# Patient Record
Sex: Female | Born: 1965 | Race: White | Hispanic: No | Marital: Married | Smoking: Never smoker
Health system: Southern US, Community
[De-identification: ages and names within clinical notes are randomized; demographics above are authoritative.]

## PROBLEM LIST (undated history)

## (undated) DIAGNOSIS — D249 Benign neoplasm of unspecified breast: Secondary | ICD-10-CM

## (undated) DIAGNOSIS — J45909 Unspecified asthma, uncomplicated: Secondary | ICD-10-CM

## (undated) HISTORY — DX: Benign neoplasm of unspecified breast: D24.9

## (undated) HISTORY — DX: Unspecified asthma, uncomplicated: J45.909

---

## 1992-06-25 HISTORY — PX: BREAST LUMPECTOMY: SHX2

## 1999-11-09 ENCOUNTER — Other Ambulatory Visit: Admission: RE | Admit: 1999-11-09 | Discharge: 1999-11-09 | Payer: Self-pay | Admitting: Gynecology

## 1999-12-18 ENCOUNTER — Other Ambulatory Visit: Admission: RE | Admit: 1999-12-18 | Discharge: 1999-12-18 | Payer: Self-pay | Admitting: Gynecology

## 1999-12-18 ENCOUNTER — Encounter (INDEPENDENT_AMBULATORY_CARE_PROVIDER_SITE_OTHER): Payer: Self-pay | Admitting: Specialist

## 2000-05-09 ENCOUNTER — Other Ambulatory Visit: Admission: RE | Admit: 2000-05-09 | Discharge: 2000-05-09 | Payer: Self-pay | Admitting: Gynecology

## 2001-05-04 ENCOUNTER — Emergency Department (HOSPITAL_COMMUNITY): Admission: EM | Admit: 2001-05-04 | Discharge: 2001-05-04 | Payer: Self-pay | Admitting: Emergency Medicine

## 2002-02-04 ENCOUNTER — Other Ambulatory Visit: Admission: RE | Admit: 2002-02-04 | Discharge: 2002-02-04 | Payer: Self-pay | Admitting: Gynecology

## 2002-06-25 HISTORY — PX: SALPINGOOPHORECTOMY: SHX82

## 2002-09-06 ENCOUNTER — Emergency Department (HOSPITAL_COMMUNITY): Admission: EM | Admit: 2002-09-06 | Discharge: 2002-09-06 | Payer: Self-pay | Admitting: Emergency Medicine

## 2003-03-30 ENCOUNTER — Other Ambulatory Visit: Admission: RE | Admit: 2003-03-30 | Discharge: 2003-03-30 | Payer: Self-pay | Admitting: Gynecology

## 2004-03-22 ENCOUNTER — Other Ambulatory Visit: Admission: RE | Admit: 2004-03-22 | Discharge: 2004-03-22 | Payer: Self-pay | Admitting: Obstetrics and Gynecology

## 2005-04-17 ENCOUNTER — Ambulatory Visit: Payer: Self-pay | Admitting: Internal Medicine

## 2005-05-23 ENCOUNTER — Other Ambulatory Visit: Admission: RE | Admit: 2005-05-23 | Discharge: 2005-05-23 | Payer: Self-pay | Admitting: Obstetrics and Gynecology

## 2005-06-15 ENCOUNTER — Ambulatory Visit (HOSPITAL_COMMUNITY): Admission: RE | Admit: 2005-06-15 | Discharge: 2005-06-15 | Payer: Self-pay | Admitting: Obstetrics and Gynecology

## 2006-02-19 ENCOUNTER — Ambulatory Visit: Payer: Self-pay | Admitting: Internal Medicine

## 2006-03-28 ENCOUNTER — Ambulatory Visit: Payer: Self-pay | Admitting: Internal Medicine

## 2006-05-27 ENCOUNTER — Other Ambulatory Visit: Admission: RE | Admit: 2006-05-27 | Discharge: 2006-05-27 | Payer: Self-pay | Admitting: Obstetrics & Gynecology

## 2006-06-26 ENCOUNTER — Ambulatory Visit (HOSPITAL_COMMUNITY): Admission: RE | Admit: 2006-06-26 | Discharge: 2006-06-26 | Payer: Self-pay | Admitting: Obstetrics and Gynecology

## 2006-08-06 ENCOUNTER — Encounter: Admission: RE | Admit: 2006-08-06 | Discharge: 2006-08-06 | Payer: Self-pay | Admitting: Neurological Surgery

## 2007-04-02 DIAGNOSIS — J45909 Unspecified asthma, uncomplicated: Secondary | ICD-10-CM | POA: Insufficient documentation

## 2007-04-02 DIAGNOSIS — M545 Low back pain: Secondary | ICD-10-CM

## 2007-04-02 HISTORY — DX: Unspecified asthma, uncomplicated: J45.909

## 2007-05-01 ENCOUNTER — Ambulatory Visit: Payer: Self-pay | Admitting: Internal Medicine

## 2007-07-09 ENCOUNTER — Encounter: Admission: RE | Admit: 2007-07-09 | Discharge: 2007-07-09 | Payer: Self-pay | Admitting: Obstetrics & Gynecology

## 2007-07-27 DIAGNOSIS — D249 Benign neoplasm of unspecified breast: Secondary | ICD-10-CM

## 2007-07-27 HISTORY — PX: BREAST LUMPECTOMY: SHX2

## 2007-07-27 HISTORY — DX: Benign neoplasm of unspecified breast: D24.9

## 2007-07-27 HISTORY — PX: OTHER SURGICAL HISTORY: SHX169

## 2007-08-13 ENCOUNTER — Telehealth: Payer: Self-pay | Admitting: Internal Medicine

## 2007-08-28 ENCOUNTER — Other Ambulatory Visit: Admission: RE | Admit: 2007-08-28 | Discharge: 2007-08-28 | Payer: Self-pay | Admitting: Obstetrics & Gynecology

## 2007-09-26 ENCOUNTER — Ambulatory Visit: Payer: Self-pay | Admitting: Internal Medicine

## 2008-06-27 ENCOUNTER — Telehealth: Payer: Self-pay | Admitting: Internal Medicine

## 2008-06-28 ENCOUNTER — Ambulatory Visit: Payer: Self-pay | Admitting: Internal Medicine

## 2008-09-02 ENCOUNTER — Other Ambulatory Visit: Admission: RE | Admit: 2008-09-02 | Discharge: 2008-09-02 | Payer: Self-pay | Admitting: Obstetrics & Gynecology

## 2009-03-14 ENCOUNTER — Ambulatory Visit: Payer: Self-pay | Admitting: Internal Medicine

## 2009-05-16 ENCOUNTER — Ambulatory Visit: Payer: Self-pay | Admitting: Internal Medicine

## 2009-05-16 DIAGNOSIS — R21 Rash and other nonspecific skin eruption: Secondary | ICD-10-CM

## 2010-04-05 ENCOUNTER — Ambulatory Visit: Payer: Self-pay | Admitting: Internal Medicine

## 2010-04-05 DIAGNOSIS — J069 Acute upper respiratory infection, unspecified: Secondary | ICD-10-CM | POA: Insufficient documentation

## 2010-07-15 ENCOUNTER — Encounter: Payer: Self-pay | Admitting: Obstetrics and Gynecology

## 2010-07-25 NOTE — Assessment & Plan Note (Signed)
Summary: CONGESTION, TROUBLE BREATHING//SLM   Vital Signs:  Patient profile:   45 year old female Weight:      183 pounds Temp:     98.3 degrees F oral BP sitting:   108 / 74  (right arm) Cuff size:   regular  Vitals Entered By: Duard Brady LPN (April 05, 2010 11:19 AM) CC: c/o cough congestion, ?fever Is Patient Diabetic? No   CC:  c/o cough congestion and ?fever.  History of Present Illness: 45 year old patient who has a history of asthma.  She presents with a 3 to 4-day history of increasing chest congestion, cough, and low-grade fever.  She has been using albuterol with minutes.  It.  She has been off her maintenance Advair for some time.  No active wheezing, shortness of breath or chest pain.  No purulent sputum production.  Allergies (verified): No Known Drug Allergies  Past History:  Past Medical History: Reviewed history from 04/02/2007 and no changes required. SAR Asthma Low back pain  Review of Systems       The patient complains of anorexia, fever, hoarseness, and prolonged cough.  The patient denies weight loss, weight gain, vision loss, decreased hearing, chest pain, syncope, dyspnea on exertion, peripheral edema, headaches, hemoptysis, abdominal pain, melena, hematochezia, severe indigestion/heartburn, hematuria, incontinence, genital sores, muscle weakness, suspicious skin lesions, transient blindness, difficulty walking, depression, unusual weight change, abnormal bleeding, enlarged lymph nodes, angioedema, breast masses, and testicular masses.    Physical Exam  General:  Well-developed,well-nourished,in no acute distress; alert,appropriate and cooperative throughout examination Head:  Normocephalic and atraumatic without obvious abnormalities. No apparent alopecia or balding. Eyes:  No corneal or conjunctival inflammation noted. EOMI. Perrla. Funduscopic exam benign, without hemorrhages, exudates or papilledema. Vision grossly normal. Ears:  External  ear exam shows no significant lesions or deformities.  Otoscopic examination reveals clear canals, tympanic membranes are intact bilaterally without bulging, retraction, inflammation or discharge. Hearing is grossly normal bilaterally. Mouth:  pharyngeal erythema.   Neck:  No deformities, masses, or tenderness noted. Lungs:  few scattered rhonchi no active wheezing O2 saturation 97% Heart:  Normal rate and regular rhythm. S1 and S2 normal without gallop, murmur, click, rub or other extra sounds.  no tachycardia Abdomen:  Bowel sounds positive,abdomen soft and non-tender without masses, organomegaly or hernias noted.   Impression & Recommendations:  Problem # 1:  URI (ICD-465.9)  The following medications were removed from the medication list:    Hydrocodone-homatropine 5-1.5 Mg/26ml Syrp (Hydrocodone-homatropine) .Marland Kitchen... 1 teaspoon every 6 hours as needed for cough Her updated medication list for this problem includes:    Hydrocodone-homatropine 5-1.5 Mg/49ml Syrp (Hydrocodone-homatropine) .Marland Kitchen... 1 teaspoon every 6 hours as needed for cough  Problem # 2:  ASTHMA (ICD-493.90)  The following medications were removed from the medication list:    Advair Diskus 250-50 Mcg/dose Misc (Fluticasone-salmeterol) .Marland Kitchen... 1 puff two times a day    Prednisone (pak) 5 Mg Tabs (Prednisone) Her updated medication list for this problem includes:    Albuterol 90 Mcg/act Aers (Albuterol) ..... Use as needed    Advair Diskus 250-50 Mcg/dose Aepb (Fluticasone-salmeterol) ..... One puff twice daily  Complete Medication List: 1)  Albuterol 90 Mcg/act Aers (Albuterol) .... Use as needed 2)  Advair Diskus 250-50 Mcg/dose Aepb (Fluticasone-salmeterol) .... One puff twice daily 3)  Hydrocodone-homatropine 5-1.5 Mg/3ml Syrp (Hydrocodone-homatropine) .Marland Kitchen.. 1 teaspoon every 6 hours as needed for cough  Patient Instructions: 1)  Get plenty of rest, drink lots of clear liquids, and use Tylenol or  Ibuprofen for fever and  comfort. Return in 7-10 days if you're not better:sooner if you're feeling worse. Prescriptions: HYDROCODONE-HOMATROPINE 5-1.5 MG/5ML SYRP (HYDROCODONE-HOMATROPINE) 1 teaspoon every 6 hours as needed for cough  #6 oz x 0   Entered and Authorized by:   Gordy Savers  MD   Signed by:   Gordy Savers  MD on 04/05/2010   Method used:   Print then Give to Patient   RxID:   [id number] ADVAIR DISKUS 250-50 MCG/DOSE AEPB (FLUTICASONE-SALMETEROL) one puff twice daily  #3 x 3   Entered and Authorized by:   Gordy Savers  MD   Signed by:   Gordy Savers  MD on 04/05/2010   Method used:   Print then Give to Patient   RxID:   [id number] ALBUTEROL 90 MCG/ACT  AERS (ALBUTEROL) use as needed  #3 x 6   Entered and Authorized by:   Gordy Savers  MD   Signed by:   Gordy Savers  MD on 04/05/2010   Method used:   Print then Give to Patient   RxID:   [id number]

## 2010-11-02 ENCOUNTER — Encounter: Payer: Self-pay | Admitting: Internal Medicine

## 2010-11-02 ENCOUNTER — Ambulatory Visit (INDEPENDENT_AMBULATORY_CARE_PROVIDER_SITE_OTHER): Payer: Self-pay | Admitting: Internal Medicine

## 2010-11-02 VITALS — BP 110/74 | Temp 98.1°F | Wt 170.0 lb

## 2010-11-02 DIAGNOSIS — E785 Hyperlipidemia, unspecified: Secondary | ICD-10-CM

## 2010-11-02 NOTE — Progress Notes (Signed)
  Subjective:    Patient ID: Kathleen Farley, female    DOB: 07/22/1965, 45 y.o.   MRN: [id number]  HPI  45 year old patient who is seen today to discuss an abnormal lipid profile obtained by gynecology. Lipid profile was reviewed LDL cholesterol is elevated at 150 with an HDL cholesterol 43 her total cholesterol HDL ratio was mildly elevated at 5.0. She has no other cardiovascular risk factors such as hypertension smoking diabetes or family history of premature coronary artery disease. She is embarking on a weight loss program and has been very successful she has been better losing weight and exercising regularly. She does not desire to drug therapy for her dyslipidemia.    Review of Systems  Constitutional: Negative.        [Voluntary weight loss HENT: Negative for hearing loss, congestion, sore throat, rhinorrhea, dental problem, sinus pressure and tinnitus.   Eyes: Negative for pain, discharge and visual disturbance.  Respiratory: Negative for cough and shortness of breath.   Cardiovascular: Negative for chest pain, palpitations and leg swelling.  Gastrointestinal: Negative for nausea, vomiting, abdominal pain, diarrhea, constipation, blood in stool and abdominal distention.  Genitourinary: Negative for dysuria, urgency, frequency, hematuria, flank pain, vaginal bleeding, vaginal discharge, difficulty urinating, vaginal pain and pelvic pain.  Musculoskeletal: Negative for joint swelling, arthralgias and gait problem.  Skin: Negative for rash.  Neurological: Negative for dizziness, syncope, speech difficulty, weakness, numbness and headaches.  Hematological: Negative for adenopathy.  Psychiatric/Behavioral: Negative for behavioral problems, dysphoric mood and agitation. The patient is not nervous/anxious.        Objective:   Physical Exam  Constitutional: She appears well-developed and well-nourished. No distress.       Blood pressure and a low normal range at 110/74 Weight 170-down  from 186          Assessment & Plan:   Mild dyslipidemia Exogenous obesity. Patient has a lost weight nicely and continues her efforts at weight loss and exercise. Options were discussed and she does not desire to drug therapy. Her overall risk of premature coronary artery disease is low. Will recheck a lipid panel in 6-12 months

## 2010-11-02 NOTE — Patient Instructions (Signed)
You need to lose weight.  Consider a lower calorie diet and regular exercise.    It is important that you exercise regularly, at least 20 minutes 3 to 4 times per week.  If you develop chest pain or shortness of breath seek  medical attention.  followup lipid profile in 6 months

## 2010-11-14 ENCOUNTER — Encounter: Payer: Self-pay | Admitting: Internal Medicine

## 2011-05-31 ENCOUNTER — Other Ambulatory Visit: Payer: Self-pay | Admitting: Internal Medicine

## 2011-08-15 ENCOUNTER — Encounter: Payer: Self-pay | Admitting: Internal Medicine

## 2011-08-15 ENCOUNTER — Ambulatory Visit (INDEPENDENT_AMBULATORY_CARE_PROVIDER_SITE_OTHER): Payer: 59 | Admitting: Internal Medicine

## 2011-08-15 DIAGNOSIS — J45909 Unspecified asthma, uncomplicated: Secondary | ICD-10-CM

## 2011-08-15 DIAGNOSIS — J069 Acute upper respiratory infection, unspecified: Secondary | ICD-10-CM

## 2011-08-15 MED ORDER — AZITHROMYCIN 250 MG PO TABS: ORAL_TABLET | ORAL | Status: AC

## 2011-08-15 MED ORDER — HYDROCODONE-HOMATROPINE 5-1.5 MG/5ML PO SYRP: 5.0000 mL | ORAL_SOLUTION | Freq: Four times a day (QID) | ORAL | Status: AC | PRN

## 2011-08-15 NOTE — Progress Notes (Signed)
  Subjective:    Patient ID: Kathleen Farley, female    DOB: 07/22/1965, 46 y.o.   MRN: [id number]  HPI  46 year old patient has a history of chronic asthma on maintenance inhalational medications. For the past 2 weeks she has had head and chest congestion. More recently she has developed worsening productive cough yielding yellow sputum she has developed increasing chest tightness and some wheezing. She has required more rescue albuterol treatments. No fever or chills    Review of Systems  Constitutional: Negative.   HENT: Positive for congestion. Negative for hearing loss, sore throat, rhinorrhea, dental problem, sinus pressure and tinnitus.   Eyes: Negative for pain, discharge and visual disturbance.  Respiratory: Positive for cough, chest tightness, shortness of breath and wheezing.   Cardiovascular: Negative for chest pain, palpitations and leg swelling.  Gastrointestinal: Negative for nausea, vomiting, abdominal pain, diarrhea, constipation, blood in stool and abdominal distention.  Genitourinary: Negative for dysuria, urgency, frequency, hematuria, flank pain, vaginal bleeding, vaginal discharge, difficulty urinating, vaginal pain and pelvic pain.  Musculoskeletal: Negative for joint swelling, arthralgias and gait problem.  Skin: Negative for rash.  Neurological: Negative for dizziness, syncope, speech difficulty, weakness, numbness and headaches.  Hematological: Negative for adenopathy.  Psychiatric/Behavioral: Negative for behavioral problems, dysphoric mood and agitation. The patient is not nervous/anxious.        Objective:   Physical Exam  Constitutional: She is oriented to person, place, and time. She appears well-developed and well-nourished.  HENT:  Head: Normocephalic.  Right Ear: External ear normal.  Left Ear: External ear normal.  Mouth/Throat: Oropharynx is clear and moist.  Eyes: Conjunctivae and EOM are normal. Pupils are equal, round, and reactive to light.    Neck: Normal range of motion. Neck supple. No thyromegaly present.  Cardiovascular: Normal rate, regular rhythm, normal heart sounds and intact distal pulses.   Pulmonary/Chest: Effort normal. She has wheezes.       A few faint scattered wheezing  Abdominal: Soft. Bowel sounds are normal. She exhibits no mass. There is no tenderness.  Musculoskeletal: Normal range of motion.  Lymphadenopathy:    She has no cervical adenopathy.  Neurological: She is alert and oriented to person, place, and time.  Skin: Skin is warm and dry. No rash noted.  Psychiatric: She has a normal mood and affect. Her behavior is normal.          Assessment & Plan:    Acute asthmatic bronchitis. We'll continue fluids expectorants and maintenance inhalational medications. Will treat with azithromycin. Will call if unimproved

## 2011-08-15 NOTE — Patient Instructions (Signed)
Get plenty of rest, Drink lots of  clear liquids, and use Tylenol or ibuprofen for fever and discomfort.    Call or return to clinic prn if these symptoms worsen or fail to improve as anticipated.  

## 2011-08-23 ENCOUNTER — Telehealth: Payer: Self-pay | Admitting: Internal Medicine

## 2011-08-23 MED ORDER — PREDNISONE 20 MG PO TABS: ORAL_TABLET | ORAL | Status: DC

## 2011-08-23 NOTE — Telephone Encounter (Signed)
Pt aware rx sent to [facility] 

## 2011-08-23 NOTE — Telephone Encounter (Signed)
Spoke with pt- -still coughing, still whezzing, no fever , bodyaches - feels worse now. Finished abx - using inhaler and cough med Please advise

## 2011-08-23 NOTE — Telephone Encounter (Signed)
Prednisone 20 mg  one twice daily for 5 days then 1 daily for an additional 5 days  #15

## 2011-08-23 NOTE — Telephone Encounter (Signed)
Pt was seen on 08-15-11 for bronchitits. Pt is still cough and still weak. Pt is requesting to talk to kim

## 2011-08-31 ENCOUNTER — Ambulatory Visit (INDEPENDENT_AMBULATORY_CARE_PROVIDER_SITE_OTHER): Payer: 59 | Admitting: Internal Medicine

## 2011-08-31 ENCOUNTER — Encounter: Payer: Self-pay | Admitting: Internal Medicine

## 2011-08-31 DIAGNOSIS — J069 Acute upper respiratory infection, unspecified: Secondary | ICD-10-CM

## 2011-08-31 DIAGNOSIS — J45909 Unspecified asthma, uncomplicated: Secondary | ICD-10-CM

## 2011-08-31 MED ORDER — FLUTICASONE-SALMETEROL 250-50 MCG/DOSE IN AEPB: 1.0000 | INHALATION_SPRAY | Freq: Two times a day (BID) | RESPIRATORY_TRACT | Status: DC

## 2011-08-31 MED ORDER — ALBUTEROL SULFATE HFA 108 (90 BASE) MCG/ACT IN AERS: 2.0000 | INHALATION_SPRAY | Freq: Four times a day (QID) | RESPIRATORY_TRACT | Status: DC | PRN

## 2011-08-31 NOTE — Patient Instructions (Signed)
Get plenty of rest, Drink lots of  clear liquids, and use Tylenol or ibuprofen for fever and discomfort.    Call or return to clinic prn if these symptoms worsen or fail to improve as anticipated.  

## 2011-08-31 NOTE — Progress Notes (Signed)
  Subjective:    Patient ID: Kathleen Farley, female    DOB: 07/22/1965, 46 y.o.   MRN: [id number]  HPI 46 year old patient has a history of chronic asthma. She was seen a few weeks ago and treated for asthmatic bronchitis with prednisone and antibiotic therapy. She seemed to improve. 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 Recently she has had increasing cough sinus and chest congestion. There's been no productive cough or wheezing. She basically feels weak and fatigued.     Review of Systems  Constitutional: Positive for activity change, appetite change and fatigue.  HENT: Positive for congestion and rhinorrhea. Negative for hearing loss, sore throat, dental problem, sinus pressure and tinnitus.   Eyes: Negative for pain, discharge and visual disturbance.  Respiratory: Negative for cough and shortness of breath.   Cardiovascular: Negative for chest pain, palpitations and leg swelling.  Gastrointestinal: Negative for nausea, vomiting, abdominal pain, diarrhea, constipation, blood in stool and abdominal distention.  Genitourinary: Negative for dysuria, urgency, frequency, hematuria, flank pain, vaginal bleeding, vaginal discharge, difficulty urinating, vaginal pain and pelvic pain.  Musculoskeletal: Negative for joint swelling, arthralgias and gait problem.  Skin: Negative for rash.  Neurological: Negative for dizziness, syncope, speech difficulty, weakness, numbness and headaches.  Hematological: Negative for adenopathy.  Psychiatric/Behavioral: Negative for behavioral problems, dysphoric mood and agitation. The patient is not nervous/anxious.        Objective:   Physical Exam  Constitutional: She is oriented to person, place, and time. She appears well-developed and well-nourished.  HENT:  Head: Normocephalic.  Right Ear: External ear normal.  Left Ear: External ear normal.  Mouth/Throat: Oropharynx is clear and moist.  Eyes: Conjunctivae and EOM are  normal. Pupils are equal, round, and reactive to light.  Neck: Normal range of motion. Neck supple. No thyromegaly present.  Cardiovascular: Normal rate, regular rhythm, normal heart sounds and intact distal pulses.   Pulmonary/Chest: Effort normal and breath sounds normal.       O2 saturation 97%. Pulse rate 82  Abdominal: Soft. Bowel sounds are normal. She exhibits no mass. There is no tenderness.  Musculoskeletal: Normal range of motion.  Lymphadenopathy:    She has no cervical adenopathy.  Neurological: She is alert and oriented to person, place, and time.  Skin: Skin is warm and dry. No rash noted.  Psychiatric: She has a normal mood and affect. Her behavior is normal.          Assessment & Plan:  Viral URI. Will continue symptomatic treatment and rest Stable asthma. Continue maintenance medication  Return here when necessary if not improved

## 2011-10-08 LAB — HM MAMMOGRAPHY: HM Mammogram: NEGATIVE

## 2011-10-29 ENCOUNTER — Ambulatory Visit (INDEPENDENT_AMBULATORY_CARE_PROVIDER_SITE_OTHER): Payer: 59 | Admitting: Family

## 2011-10-29 ENCOUNTER — Encounter: Payer: Self-pay | Admitting: Family

## 2011-10-29 VITALS — BP 110/80 | Temp 98.6°F | Wt 178.0 lb

## 2011-10-29 DIAGNOSIS — S139XXA Sprain of joints and ligaments of unspecified parts of neck, initial encounter: Secondary | ICD-10-CM

## 2011-10-29 DIAGNOSIS — S161XXA Strain of muscle, fascia and tendon at neck level, initial encounter: Secondary | ICD-10-CM

## 2011-10-29 DIAGNOSIS — M542 Cervicalgia: Secondary | ICD-10-CM

## 2011-10-29 MED ORDER — CYCLOBENZAPRINE HCL 10 MG PO TABS: 10.0000 mg | ORAL_TABLET | Freq: Three times a day (TID) | ORAL | Status: AC | PRN

## 2011-10-29 MED ORDER — DICLOFENAC SODIUM 75 MG PO TBEC: 75.0000 mg | DELAYED_RELEASE_TABLET | Freq: Two times a day (BID) | ORAL | Status: DC

## 2011-10-29 NOTE — Patient Instructions (Signed)
Muscle Strain A muscle strain, or pulled muscle, occurs when a muscle is over-stretched. A small number of muscle fibers may also be torn. This is especially common in athletes. This happens when a sudden violent force placed on a muscle pushes it past its capacity. Usually, recovery from a pulled muscle takes 1 to 2 weeks. But complete healing will take 5 to 6 weeks. There are millions of muscle fibers. Following injury, your body will usually return to normal quickly. HOME CARE INSTRUCTIONS   While awake, apply ice to the sore muscle for 15 to 20 minutes each hour for the first 2 days. Put ice in a plastic bag and place a towel between the bag of ice and your skin.   Do not use the pulled muscle for several days. Do not use the muscle if you have pain.   You may wrap the injured area with an elastic bandage for comfort. Be careful not to bind it too tightly. This may interfere with blood circulation.   Only take over-the-counter or prescription medicines for pain, discomfort, or fever as directed by your caregiver. Do not use aspirin as this will increase bleeding (bruising) at injury site.   Warming up before exercise helps prevent muscle strains.  SEEK MEDICAL CARE IF:  There is increased pain or swelling in the affected area. MAKE SURE YOU:   Understand these instructions.   Will watch your condition.   Will get help right away if you are not doing well or get worse.  Document Released: 06/11/2005 Document Revised: 05/31/2011 Document Reviewed: 01/08/2007 [employer] Patient Information 2012 [employer]. 

## 2011-10-29 NOTE — Progress Notes (Signed)
  Subjective:    Patient ID: Kathleen Farley, female    DOB: 07/22/1965, 46 y.o.   MRN: [id number]  HPI 46 year old female, nonsmoker, patient of Dr. Kirtland Bouchard. is in today with complaints of neck stiffness and pain x1 week. 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 She had a similar issue to this about 6 months ago. She rates her pain today a 10 out of 10, worse with movement, describes it as achy and tight. She's been taking ibuprofen 200 mg 3 times a day with no relief. Patient reports numbness in her left arm and hand. She denies any lightheadedness, dizziness, chest pain, palpitations, shortness of breath or edema.   Review of Systems  Constitutional: Negative.   Respiratory: Negative.   Cardiovascular: Negative.   Gastrointestinal: Negative.   Musculoskeletal:       Neck pain and left shoulder tenderness  Neurological: Negative.   Hematological: Negative.   Psychiatric/Behavioral: Negative.    Past Medical History  Diagnosis Date  . ASTHMA 04/02/2007    History   Social History  . Marital Status: Single    Spouse Name: N/A    Number of Children: N/A  . Years of Education: N/A   Occupational History  . Not on file.   Social History Main Topics  . Smoking status: Never Smoker   . Smokeless tobacco: Never Used  . Alcohol Use: No  . Drug Use: No  . Sexually Active: Not on file   Other Topics Concern  . Not on file   Social History Narrative  . No narrative on file    No past surgical history on file.  Family History  Problem Relation Age of Onset  . Cancer Neg Hx     family hx breast ca    No Known Allergies  Current Outpatient Prescriptions on File Prior to Visit  Medication Sig Dispense Refill  . albuterol (PROAIR HFA) 108 (90 BASE) MCG/ACT inhaler Inhale 2 puffs into the lungs every 6 (six) hours as needed for wheezing or shortness of breath.  18 g  3  . Fluticasone-Salmeterol (ADVAIR  DISKUS) 250-50 MCG/DOSE AEPB Inhale 1 puff into the lungs every 12 (twelve) hours.  180 each  3    BP 110/80  Temp(Src) 98.6 F (37 C) (Oral)  Wt 178 lb (80.74 kg)chart    Objective:   Physical Exam  Constitutional: She is oriented to person, place, and time. She appears well-developed and well-nourished.  Neck: Neck supple.       Pain elicited with flexion and lateral rotation of the neck.  Cardiovascular: Normal rate, regular rhythm and normal heart sounds.   Pulmonary/Chest: Effort normal and breath sounds normal.  Musculoskeletal: Normal range of motion.       As stated previously  Neurological: She is alert and oriented to person, place, and time.  Skin: Skin is warm and dry.  Psychiatric: She has a normal mood and affect.          Assessment & Plan:  Assessment: Neck pain, muscle strain  Plan: Flexeril 10 mg 3 times a day, warned of drowsiness. Voltaren 75 mg twice a day with food. Ice and heat to the affected area. Patient requests physical therapy, done. Patient call the office if her symptoms worsen or persist, recheck as scheduled and when necessary.

## 2011-10-31 ENCOUNTER — Telehealth: Payer: Self-pay | Admitting: Family Medicine

## 2011-10-31 NOTE — Telephone Encounter (Signed)
[facility] Triage Call Report Triage Record Num: [id number] Operator: Geanie Berlin Patient Name: Kathleen Farley Call Date & Time: 10/28/2011 2:15:38PM Patient Phone: [phone] PCP: Gordy Savers Patient Gender: Female PCP Fax : [phone] Patient DOB: 07-23-1965 [facility] Reason for Call: Caller: Joanell Rising; PCP: Eleonore Chiquito; CB#: [phone]; Call regarding Stiff Neck L side; Onset 10/26/11. Afebrile. No know injury or falls. LMP/Mirena. Used Ibuprofen with minimal relief. Requires assistance from husband to get out of bed d/t severe pain. Denies weakness. Advised to see [facility] now for unbearable neck pain per Neck Pain or Injury Guideline. Protocol(s) Used: Neck Pain or Injury Recommended Outcome per Protocol: See ED Immediately Reason for Outcome: Unbearable pain Care Advice: ~ 05/

## 2011-11-01 ENCOUNTER — Ambulatory Visit: Payer: 59 | Attending: Family | Admitting: Physical Therapy

## 2011-11-01 DIAGNOSIS — IMO0001 Reserved for inherently not codable concepts without codable children: Secondary | ICD-10-CM | POA: Insufficient documentation

## 2011-11-01 DIAGNOSIS — M542 Cervicalgia: Secondary | ICD-10-CM | POA: Insufficient documentation

## 2011-11-01 DIAGNOSIS — M546 Pain in thoracic spine: Secondary | ICD-10-CM | POA: Insufficient documentation

## 2011-11-06 ENCOUNTER — Ambulatory Visit: Payer: 59 | Admitting: Physical Therapy

## 2011-11-08 ENCOUNTER — Ambulatory Visit: Payer: 59 | Admitting: Physical Therapy

## 2011-11-13 ENCOUNTER — Ambulatory Visit: Payer: 59 | Admitting: Physical Therapy

## 2011-11-15 ENCOUNTER — Encounter: Payer: 59 | Admitting: Physical Therapy

## 2011-11-22 ENCOUNTER — Encounter: Payer: 59 | Admitting: Physical Therapy

## 2011-11-27 ENCOUNTER — Encounter: Payer: 59 | Admitting: Physical Therapy

## 2012-01-21 ENCOUNTER — Encounter: Payer: Self-pay | Admitting: Internal Medicine

## 2012-01-21 ENCOUNTER — Ambulatory Visit (INDEPENDENT_AMBULATORY_CARE_PROVIDER_SITE_OTHER): Payer: 59 | Admitting: Internal Medicine

## 2012-01-21 VITALS — BP 94/68 | Temp 98.4°F | Wt 173.0 lb

## 2012-01-21 DIAGNOSIS — J45909 Unspecified asthma, uncomplicated: Secondary | ICD-10-CM

## 2012-01-21 DIAGNOSIS — J069 Acute upper respiratory infection, unspecified: Secondary | ICD-10-CM

## 2012-01-21 MED ORDER — HYDROCODONE-HOMATROPINE 5-1.5 MG/5ML PO SYRP: 5.0000 mL | ORAL_SOLUTION | Freq: Four times a day (QID) | ORAL | Status: AC | PRN

## 2012-01-21 NOTE — Patient Instructions (Signed)
Get plenty of rest, Drink lots of  clear liquids, and use Tylenol or ibuprofen for fever and discomfort.    Call or return to clinic prn if these symptoms worsen or fail to improve as anticipated.  

## 2012-01-21 NOTE — Progress Notes (Signed)
Subjective:    Patient ID: Okey Regal, female    DOB: 07/22/1965, 46 y.o.   MRN: [id number]  HPI  46 year old patient has a history of chronic stable asthma. For the past 2 days she's had increasing cough chest congestion and wheezing. She's had intermittent low-grade fever. Cough is largely nonproductive.  Past Medical History  Diagnosis Date  . ASTHMA 04/02/2007    History   Social History  . Marital Status: Single    Spouse Name: N/A    Number of Children: N/A  . Years of Education: N/A   Occupational History  . Not on file.   Social History Main Topics  . Smoking status: Never Smoker   . Smokeless tobacco: Never Used  . Alcohol Use: No  . Drug Use: No  . Sexually Active: Not on file   Other Topics Concern  . Not on file   Social History Narrative  . No narrative on file    No past surgical history on file.  Family History  Problem Relation Age of Onset  . Cancer Neg Hx     family hx breast ca    No Known Allergies  Current Outpatient Prescriptions on File Prior to Visit  Medication Sig Dispense Refill  . albuterol (PROAIR HFA) 108 (90 BASE) MCG/ACT inhaler Inhale 2 puffs into the lungs every 6 (six) hours as needed for wheezing or shortness of breath.  18 g  3  . diclofenac (VOLTAREN) 75 MG EC tablet Take 1 tablet (75 mg total) by mouth 2 (two) times daily.  30 tablet  0  . Fluticasone-Salmeterol (ADVAIR DISKUS) 250-50 MCG/DOSE AEPB Inhale 1 puff into the lungs every 12 (twelve) hours.  180 each  3    BP 94/68  Temp 98.4 F (36.9 C) (Oral)  Wt 173 lb (78.472 kg)  SpO2 95%       Review of Systems  Constitutional: Positive for fever and fatigue. Negative for chills and diaphoresis.  HENT: Negative for hearing loss, congestion, sore throat, rhinorrhea, dental problem, sinus pressure and tinnitus.   Eyes: Negative for pain, discharge and visual disturbance.  Respiratory: Positive for cough, shortness of breath and wheezing.   Cardiovascular:  Negative for chest pain, palpitations and leg swelling.  Gastrointestinal: Negative for nausea, vomiting, abdominal pain, diarrhea, constipation, blood in stool and abdominal distention.  Genitourinary: Negative for dysuria, urgency, frequency, hematuria, flank pain, vaginal bleeding, vaginal discharge, difficulty urinating, vaginal pain and pelvic pain.  Musculoskeletal: Negative for joint swelling, arthralgias and gait problem.  Skin: Negative for rash.  Neurological: Negative for dizziness, syncope, speech difficulty, weakness, numbness and headaches.  Hematological: Negative for adenopathy.  Psychiatric/Behavioral: Negative for behavioral problems, dysphoric mood and agitation. The patient is not nervous/anxious.        Objective:   Physical Exam  Constitutional: She is oriented to person, place, and time. She appears well-developed and well-nourished.  HENT:  Head: Normocephalic.  Right Ear: External ear normal.  Left Ear: External ear normal.  Mouth/Throat: Oropharynx is clear and moist.  Eyes: Conjunctivae and EOM are normal. Pupils are equal, round, and reactive to light.  Neck: Normal range of motion. Neck supple. No thyromegaly present.  Cardiovascular: Normal rate, regular rhythm, normal heart sounds and intact distal pulses.   Pulmonary/Chest: Effort normal. No respiratory distress. She has wheezes.  Abdominal: Soft. Bowel sounds are normal. She exhibits no mass. There is no tenderness.  Musculoskeletal: Normal range of motion.  Lymphadenopathy:    She has no  cervical adenopathy.  Neurological: She is alert and oriented to person, place, and time.  Skin: Skin is warm and dry. No rash noted.  Psychiatric: She has a normal mood and affect. Her behavior is normal.          Assessment & Plan:   Asthmatic bronchitis secondary to viral URI. Will treat with Depo-Medrol expectorants and antitussives

## 2012-01-22 ENCOUNTER — Telehealth: Payer: Self-pay | Admitting: Internal Medicine

## 2012-01-22 MED ORDER — PREDNISONE 20 MG PO TABS: ORAL_TABLET | ORAL | Status: DC

## 2012-01-22 NOTE — Telephone Encounter (Signed)
Please advise 

## 2012-01-22 NOTE — Telephone Encounter (Signed)
done

## 2012-01-22 NOTE — Telephone Encounter (Signed)
Please call in a prescription for a prednisone Dosepak 5 mg 6 day

## 2012-01-22 NOTE — Telephone Encounter (Signed)
Per Emergent Line: Caller: Sam/Child; PCP: Eleonore Chiquito; CB#: [phone]. Call regarding Patient Has Asthma and Was Given A. Steroid Shot Yesterday, Still Not Helping She Is Using Her Inhaler So Much She Is Shaking. RN spoke with son who is RPh in [place]. Calling to advise Mrs Bouler is still having problems with her asthma. He is not with her but reports she has told him she is using her Inhaler q 1-2 hrs. Caller reports the last time this occured MD gave her oral Prednisone. RN asked for # to call somone who is with her now. Caller reports she does not have Emergent sxs, instead just needs some medication. Son then gave me her # [phone]. Caller reports she is still having sob despite shot yesterday. Wheezing and coughing. She is shaking because of freq use of Albuterol as noted above. Emergent Sxs ruled out. Per Asthma Attack Protocol, following Providers instructions and sxs have not improved, see in 4 hrs. Caller would like MD to be aware of sxs and asking if she can get oral steroids like she did with last flare up. [facility] [address] [facility] [place]. Caller advised a note will be sent to PCP with her request. Caller advised if sxs worsen, she should call 911. She is agreeable.

## 2012-02-20 ENCOUNTER — Ambulatory Visit (INDEPENDENT_AMBULATORY_CARE_PROVIDER_SITE_OTHER): Payer: 59 | Admitting: Internal Medicine

## 2012-02-20 ENCOUNTER — Encounter: Payer: Self-pay | Admitting: Internal Medicine

## 2012-02-20 VITALS — BP 100/70 | Temp 98.0°F | Wt 175.0 lb

## 2012-02-20 DIAGNOSIS — Z8669 Personal history of other diseases of the nervous system and sense organs: Secondary | ICD-10-CM

## 2012-02-20 DIAGNOSIS — J45909 Unspecified asthma, uncomplicated: Secondary | ICD-10-CM

## 2012-02-20 MED ORDER — SUMATRIPTAN SUCCINATE 100 MG PO TABS: 100.0000 mg | ORAL_TABLET | ORAL | Status: DC | PRN

## 2012-02-20 NOTE — Progress Notes (Signed)
Subjective:    Patient ID: Kathleen Farley, female    DOB: 07/22/1965, 46 y.o.   MRN: [id number]  HPI  46 year old patient who is seen today for followup of migraine headaches. She does have a history of migraine headaches that have become bothersome over the past 6 months. She was prescribed Imitrex by OB/GYN which was quite helpful in terminating the headaches. She has one or 2 headaches a month that usually awake her at night and are associated with nausea occasional vomiting and diarrhea. Headaches last several hours and are eventually relieved by ibuprofen or Excedrin. Detumesce sleep she has considerable drowsiness the following day.  Past Medical History  Diagnosis Date  . ASTHMA 04/02/2007    History   Social History  . Marital Status: Single    Spouse Name: N/A    Number of Children: N/A  . Years of Education: N/A   Occupational History  . Not on file.   Social History Main Topics  . Smoking status: Never Smoker   . Smokeless tobacco: Never Used  . Alcohol Use: No  . Drug Use: No  . Sexually Active: Not on file   Other Topics Concern  . Not on file   Social History Narrative  . No narrative on file    No past surgical history on file.  Family History  Problem Relation Age of Onset  . Cancer Neg Hx     family hx breast ca    No Known Allergies  Current Outpatient Prescriptions on File Prior to Visit  Medication Sig Dispense Refill  . albuterol (PROAIR HFA) 108 (90 BASE) MCG/ACT inhaler Inhale 2 puffs into the lungs every 6 (six) hours as needed for wheezing or shortness of breath.  18 g  3  . Fluticasone-Salmeterol (ADVAIR DISKUS) 250-50 MCG/DOSE AEPB Inhale 1 puff into the lungs every 12 (twelve) hours.  180 each  3    BP 100/70  Temp 98 F (36.7 C) (Oral)  Wt 175 lb (79.379 kg)       Review of Systems  Constitutional: Negative.   HENT: Negative for hearing loss, congestion, sore throat, rhinorrhea, dental problem, sinus pressure and tinnitus.     Eyes: Negative for pain, discharge and visual disturbance.  Respiratory: Negative for cough and shortness of breath.   Cardiovascular: Negative for chest pain, palpitations and leg swelling.  Gastrointestinal: Negative for nausea, vomiting, abdominal pain, diarrhea, constipation, blood in stool and abdominal distention.  Genitourinary: Negative for dysuria, urgency, frequency, hematuria, flank pain, vaginal bleeding, vaginal discharge, difficulty urinating, vaginal pain and pelvic pain.  Musculoskeletal: Negative for joint swelling, arthralgias and gait problem.  Skin: Negative for rash.  Neurological: Positive for headaches. Negative for dizziness, syncope, speech difficulty, weakness and numbness.  Hematological: Negative for adenopathy.  Psychiatric/Behavioral: Negative for behavioral problems, dysphoric mood and agitation. The patient is not nervous/anxious.        Objective:   Physical Exam  Constitutional: She is oriented to person, place, and time. She appears well-developed and well-nourished.  HENT:  Head: Normocephalic.  Right Ear: External ear normal.  Left Ear: External ear normal.  Mouth/Throat: Oropharynx is clear and moist.  Eyes: Conjunctivae and EOM are normal. Pupils are equal, round, and reactive to light.  Neck: Normal range of motion. Neck supple. No thyromegaly present.  Cardiovascular: Normal rate, regular rhythm, normal heart sounds and intact distal pulses.   Pulmonary/Chest: Effort normal and breath sounds normal.  Abdominal: She exhibits no mass. There is no tenderness.  Lymphadenopathy:    She has no cervical adenopathy.  Neurological: She is alert and oriented to person, place, and time.  Skin: No rash noted.  Psychiatric: She has a normal mood and affect. Her behavior is normal.          Assessment & Plan:   Migraine headaches. Now occurring at a frequency of one or 2 times per month. Has responded well to Imitrex. Will refill. Will consider  prophylactic medications in the future if symptoms become much more severe. Will also give a trial of Excedrin Migraine

## 2012-02-20 NOTE — Patient Instructions (Signed)
Call or return to clinic prn if these symptoms worsen or fail to improve as anticipated. Migraine Headache A migraine headache is an intense, throbbing pain on one or both sides of your head. The exact cause of a migraine headache is not always known. A migraine may be caused when nerves in the brain become irritated and release chemicals that cause swelling within blood vessels, causing pain. Many migraine sufferers have a family history of migraines. Before you get a migraine you may or may not get an aura. An aura is a group of symptoms that can predict the beginning of a migraine. An aura may include:  Visual changes such as:   Flashing lights.   Bright spots or zig-zag lines.   Tunnel vision.   Feelings of numbness.   Trouble talking.   Muscle weakness.  SYMPTOMS  Pain on one or both sides of your head.   Pain that is pulsating or throbbing in nature.   Pain that is severe enough to prevent daily activities.   Pain that is aggravated by any daily physical activity.   Nausea (feeling sick to your stomach), vomiting, or both.   Pain with exposure to bright lights, loud noises, or activity.   General sensitivity to bright lights or loud noises.  MIGRAINE TRIGGERS Examples of triggers of migraine headaches include:    Alcohol.   Smoking.   Stress.   It may be related to menses (female menstruation).   Aged cheeses.   Foods or drinks that contain nitrates, glutamate, aspartame, or tyramine.   Lack of sleep.   Chocolate.   Caffeine.   Hunger.   Medications such as nitroglycerine (used to treat chest pain), birth control pills, estrogen, and some blood pressure medications.  DIAGNOSIS   A migraine headache is often diagnosed based on:  Symptoms.   Physical examination.   A computerized X-ray scan (computed tomography, CT) of your head.  TREATMENT  Medications can help prevent migraines if they are recurrent or should they become recurrent. Your  caregiver can help you with a medication or treatment program that will be helpful to you.   Lying down in a dark, quiet room may be helpful.   Keeping a headache diary may help you find a trend as to what may be triggering your headaches.  SEEK IMMEDIATE MEDICAL CARE IF:    You have confusion, personality changes or seizures.   You have headaches that wake you from sleep.   You have an increased frequency in your headaches.   You have a stiff neck.   You have a loss of vision.   You have muscle weakness.   You start losing your balance or have trouble walking.   You feel faint or pass out.  MAKE SURE YOU:    Understand these instructions.   Will watch your condition.   Will get help right away if you are not doing well or get worse.  Document Released: 06/11/2005 Document Revised: 05/31/2011 Document Reviewed: 01/25/2009 [facility] Patient Information 2012 [place], [place].

## 2012-03-10 ENCOUNTER — Telehealth: Payer: Self-pay | Admitting: Internal Medicine

## 2012-03-10 ENCOUNTER — Telehealth: Payer: Self-pay | Admitting: Family Medicine

## 2012-03-10 NOTE — Telephone Encounter (Signed)
[facility] Triage Call Report Triage Record Num: [id number] Operator: Chevis Pretty Patient Name: Kathleen Farley Call Date & Time: 03/08/2012 3:14:47PM Patient Phone: [phone] PCP: Gordy Savers Patient Gender: Female PCP Fax : [phone] Patient DOB: [birth year] [facility] Name[facility] Reason for Call: Caller: Joanell Rising; PCP: Eleonore Chiquito [facility]); CB#: [phone]; Call regarding Asthma medication [place] behind while at beach; states uses albuterol bid this time of year but forgot her inhaler. Per asthma protocol, emergent symptoms denied; home care advised, with callback parameters given. Last Rx written for proair inhaler 03/13 with 3 refills; per standing orders, Rx for proair HFA 108, 2 puffs q 4-6 h prn wheezing #1, [facility] [phone]. Protocol(s) Used: Asthma - Adult Recommended Outcome per Protocol: Provide Home/Self Care Reason for Outcome: All other situations Care Advice: An annual influenza vaccination is recommended for all adults 51 years of age or older, those with chronic illness, or in contact with high risk individuals. An immunization for pneumonia is also recommended. The frail elderly may require a second pneumonia immunization 3 to 5 years after the first dose. ~ ~ Follow provider instructions or your specific asthma action plan for your symptoms. ~ Take your asthma action plan with you to all provider visits. ~ If uncertain about provider instructions, speak with provider during regular office hours. Some prescription medications (ACE inhibitors, beta blockers) and over-the-counter medications (NSAIDs) may trigger or worsen asthma symptoms. Talk with your provider if you are taking any of these medications. ~ Daily use or using more than one canister a month (even if not used daily) of short-acting inhaled medication can indicate poor control. Make an appointment with provider to  initiate or increase long-term control. ~ To maintain control, recommended provider visit schedule is: one to two months for severe persistent asthma; three to four months for moderate persistent asthma; six months to 12 months visits for mild persistent asthma and for mild intermittent asthma. ~ Try to identify possible situations or irritants that triggered your symptoms (including medications) and be sure to tell your provider. ~ See provider to re-evaluate treatment plan if you notice need for increased medication use, a decreased activity tolerance, or waking at night with asthma symptoms. ~ ~ HEALTH PROMOTION / MAINTENANCE Take any home-monitoring materials with you to provider visits. Provider may observe your use of inhaler and peak flow meter. Your asthma action plan (AAP) can be reviewed and revised based on your current needs. ~ ~ SYMPTOM / CONDITION MANAGEMENT ~ CAUTIONS PREVENTION OF ASTHMA RECURRENCES: Decrease allergen and irritant exposure - Wash bedding weekly in hot water and dry in a hot dryer. - Cover pillows and mattresses in airtight covers. - Remove carpets, especially in bedrooms. - Do not sleep on fabric-covered furniture. - Clean house thoroughly. - Keep windows of your car and home closed. - Keep humidity level in your home under 50% by using a dehumidifer or air conditioner. ~

## 2012-03-10 NOTE — Telephone Encounter (Signed)
noted 

## 2012-03-10 NOTE — Telephone Encounter (Signed)
[facility] Triage Call Report Triage Record Num: [id number] Operator: Chevis Pretty Patient Name: Kathleen Farley Call Date & Time: 03/08/2012 5:37:10PM Patient Phone: [phone] PCP: Gordy Savers Patient Gender: Female PCP Fax : [phone] Patient DOB: [birth year] [facility] Name[facility] Reason for Call: Caller: Joanell Rising; PCP: Eleonore Chiquito; CB#: [phone]; Call regarding Medication Issue; Medication(s): albuterol; states pharmacy did not receive Rx. Per triage note 03/08/12 1515, Rx for proair HFA, 2 puffs q 6 h prn wheezing, #1, [facility] [phone]. Protocol(s) Used: Office Note Recommended Outcome per Protocol: Information Noted and Sent to Office Reason for Outcome: Caller information to office Care Advice: ~ 09/

## 2012-03-11 ENCOUNTER — Telehealth: Payer: Self-pay | Admitting: Family Medicine

## 2012-03-11 MED ORDER — SUMATRIPTAN SUCCINATE 100 MG PO TABS: 100.0000 mg | ORAL_TABLET | ORAL | Status: DC | PRN

## 2012-03-11 NOTE — Telephone Encounter (Signed)
done

## 2012-03-11 NOTE — Telephone Encounter (Signed)
Insurance denied sumatriptan at 10pills/month. Pt may get 8 pills a month, MAYBE 9.

## 2012-04-28 ENCOUNTER — Other Ambulatory Visit: Payer: Self-pay | Admitting: Internal Medicine

## 2012-05-07 ENCOUNTER — Other Ambulatory Visit: Payer: Self-pay | Admitting: Internal Medicine

## 2012-05-07 MED ORDER — ALBUTEROL SULFATE HFA 108 (90 BASE) MCG/ACT IN AERS: 2.0000 | INHALATION_SPRAY | RESPIRATORY_TRACT | Status: DC | PRN

## 2012-05-07 NOTE — Telephone Encounter (Signed)
[facility]

## 2012-05-07 NOTE — Telephone Encounter (Signed)
Pharm states they never received your refill info on 04/28/12 for the [facility] 108 mgc/act Inhaler. Could you please resend? [id number] [place], [place] - [address]

## 2012-09-16 ENCOUNTER — Emergency Department (HOSPITAL_BASED_OUTPATIENT_CLINIC_OR_DEPARTMENT_OTHER): Payer: 59

## 2012-09-16 ENCOUNTER — Encounter (HOSPITAL_BASED_OUTPATIENT_CLINIC_OR_DEPARTMENT_OTHER): Payer: Self-pay | Admitting: *Deleted

## 2012-09-16 ENCOUNTER — Emergency Department (HOSPITAL_BASED_OUTPATIENT_CLINIC_OR_DEPARTMENT_OTHER)
Admission: EM | Admit: 2012-09-16 | Discharge: 2012-09-16 | Disposition: A | Discharge status: Home / Self Care | Payer: 59 | Attending: Emergency Medicine | Admitting: Emergency Medicine

## 2012-09-16 DIAGNOSIS — Z79899 Other long term (current) drug therapy: Secondary | ICD-10-CM | POA: Insufficient documentation

## 2012-09-16 DIAGNOSIS — IMO0002 Reserved for concepts with insufficient information to code with codable children: Secondary | ICD-10-CM | POA: Insufficient documentation

## 2012-09-16 DIAGNOSIS — R0789 Other chest pain: Secondary | ICD-10-CM

## 2012-09-16 DIAGNOSIS — J45909 Unspecified asthma, uncomplicated: Secondary | ICD-10-CM | POA: Insufficient documentation

## 2012-09-16 DIAGNOSIS — R071 Chest pain on breathing: Secondary | ICD-10-CM | POA: Insufficient documentation

## 2012-09-16 LAB — CBC WITH DIFFERENTIAL/PLATELET
Basophils Absolute: 0 10*3/uL (ref 0.0–0.1)
HCT: 41.9 % (ref 36.0–46.0)
Hemoglobin: 14.4 g/dL (ref 12.0–15.0)
Lymphocytes Relative: 28 % (ref 12–46)
Lymphs Abs: 2.4 10*3/uL (ref 0.7–4.0)
Monocytes Absolute: 0.5 10*3/uL (ref 0.1–1.0)
Monocytes Relative: 5 % (ref 3–12)
Neutro Abs: 5.6 10*3/uL (ref 1.7–7.7)
RBC: 4.97 MIL/uL (ref 3.87–5.11)
RDW: 12.5 % (ref 11.5–15.5)
WBC: 8.5 10*3/uL (ref 4.0–10.5)

## 2012-09-16 LAB — COMPREHENSIVE METABOLIC PANEL
AST: 23 U/L (ref 0–37)
CO2: 26 mEq/L (ref 19–32)
Chloride: 101 mEq/L (ref 96–112)
Creatinine, Ser: 0.9 mg/dL (ref 0.50–1.10)
GFR calc non Af Amer: 74 mL/min — ABNORMAL LOW (ref >90)
Glucose, Bld: 85 mg/dL (ref 70–99)
Total Bilirubin: 0.3 mg/dL (ref 0.3–1.2)

## 2012-09-16 MED ORDER — KETOROLAC TROMETHAMINE 30 MG/ML IJ SOLN
30.0000 mg | Freq: Once | INTRAMUSCULAR | Status: AC
  Administered 2012-09-16: 30 mg via INTRAVENOUS
  Filled 2012-09-16: qty 1

## 2012-09-16 MED ORDER — IBUPROFEN 600 MG PO TABS: 600.0000 mg | ORAL_TABLET | Freq: Four times a day (QID) | ORAL | Status: DC | PRN

## 2012-09-16 MED ORDER — DIAZEPAM 5 MG PO TABS: 2.5000 mg | ORAL_TABLET | ORAL | Status: DC | PRN

## 2012-09-16 MED ORDER — LORAZEPAM 2 MG/ML IJ SOLN
1.0000 mg | Freq: Once | INTRAMUSCULAR | Status: AC
  Administered 2012-09-16: 1 mg via INTRAVENOUS
  Filled 2012-09-16: qty 1

## 2012-09-16 NOTE — ED Notes (Signed)
CP began while getting dressed this am around 0700.  Pt. took one baby ASA PTA.  Never had CP like this before.

## 2012-09-16 NOTE — ED Provider Notes (Signed)
History     CSN: [id number]  Arrival date & time 09/16/12  8295   First MD Initiated Contact with Patient 09/16/12 1834      Chief Complaint  Patient presents with  . Chest Pain    (Consider location/radiation/quality/duration/timing/severity/associated sxs/prior treatment) Patient is a 47 y.o. female presenting with chest pain. The history is provided by the patient.  Chest Pain  patient here with persistent left-sided chest pain under her breast x24 hours. Pain characterized as sharp and worse for certain movements. Denies any anginal type symptoms. No associated diaphoresis or dyspnea. No recent travel history. Denies any leg pain or swelling. No pleuritic component. Pain is localized to the exterior her chest and is without rashes. Symptoms symptoms have been constant and no treatment used prior to arrival. No prior history of same. Denies recent fever cough or chills  Past Medical History  Diagnosis Date  . ASTHMA 04/02/2007    History reviewed. No pertinent past surgical history.  Family History  Problem Relation Age of Onset  . Cancer Neg Hx     family hx breast ca    History  Substance Use Topics  . Smoking status: Never Smoker   . Smokeless tobacco: Never Used  . Alcohol Use: No    OB History   Grav Para Term Preterm Abortions TAB SAB Ect Mult Living                  Review of Systems  Cardiovascular: Positive for chest pain.  All other systems reviewed and are negative.    Allergies  Review of patient's allergies indicates no known allergies.  Home Medications   Current Outpatient Rx  Name  Route  Sig  Dispense  Refill  . albuterol (PROAIR HFA) 108 (90 BASE) MCG/ACT inhaler   Inhalation   Inhale 2 puffs into the lungs every 4 (four) hours as needed for wheezing.   3 each   1   . Fluticasone-Salmeterol (ADVAIR DISKUS) 250-50 MCG/DOSE AEPB   Inhalation   Inhale 1 puff into the lungs every 12 (twelve) hours.   180 each   3   . SUMAtriptan  (IMITREX) 100 MG tablet   Oral   Take 1 tablet (100 mg total) by mouth every 2 (two) hours as needed for migraine.   8 tablet   4     There were no vitals taken for this visit.  Physical Exam  Nursing note and vitals reviewed. Constitutional: She is oriented to person, place, and time. She appears well-developed and well-nourished.  Non-toxic appearance. No distress.  HENT:  Head: Normocephalic and atraumatic.  Eyes: Conjunctivae, EOM and lids are normal. Pupils are equal, round, and reactive to light.  Neck: Normal range of motion. Neck supple. No tracheal deviation present. No mass present.  Cardiovascular: Normal rate, regular rhythm and normal heart sounds.  Exam reveals no gallop.   No murmur heard. Pulmonary/Chest: Effort normal and breath sounds normal. No stridor. No respiratory distress. She has no decreased breath sounds. She has no wheezes. She has no rhonchi. She has no rales.    Abdominal: Soft. Normal appearance and bowel sounds are normal. She exhibits no distension. There is no tenderness. There is no rebound and no CVA tenderness.  Musculoskeletal: Normal range of motion. She exhibits no edema and no tenderness.  Neurological: She is alert and oriented to person, place, and time. She has normal strength. No cranial nerve deficit or sensory deficit. GCS eye subscore is 4. GCS  verbal subscore is 5. GCS motor subscore is 6.  Skin: Skin is warm and dry. No abrasion and no rash noted.  Psychiatric: She has a normal mood and affect. Her speech is normal and behavior is normal.    ED Course  Procedures (including critical care time)  Labs Reviewed  CBC WITH DIFFERENTIAL  COMPREHENSIVE METABOLIC PANEL  TROPONIN I   No results found.   No diagnosis found.    MDM     Rate: 72   Rhythm: normal sinus rhythm  QRS Axis: normal  Intervals: normal  ST/T Wave abnormalities: normal  Conduction Disutrbances:none  Narrative Interpretation:   Old EKG Reviewed: none  available   7:36 PM Patient given medications for suspected musculoskeletal chest pain she feels better. No concern for ACS or PE. Stable for  Toy Baker, MD 09/16/12 [phone]

## 2012-09-16 NOTE — ED Notes (Signed)
Pt to room 1 in w/c, able to stand and pivot to bed in nad. Pt reports mid chest pain onset yesterday while she was in a meeting at work. Pt holding her chest, tender to palpation, pain increases with arm movements and deep breath.

## 2012-09-23 ENCOUNTER — Encounter: Payer: Self-pay | Admitting: Internal Medicine

## 2012-10-23 ENCOUNTER — Telehealth: Payer: Self-pay | Admitting: Internal Medicine

## 2012-10-23 MED ORDER — ALBUTEROL SULFATE HFA 108 (90 BASE) MCG/ACT IN AERS: 2.0000 | INHALATION_SPRAY | RESPIRATORY_TRACT | Status: DC | PRN

## 2012-10-23 NOTE — Telephone Encounter (Signed)
Pt is calling to get a refill of her Albuterol Inhaler.  Pt would like the prescription sent to [facility] because it is cheaper  [phone]) RN tried to triage patient symptoms but pt declined triage stating she is calling for refill not because she is sick but because "she has 1 Albuterol inhaler left".  OFFICE PLEASE FOLLOW UP WITH PT REGARDING ALBUTEROL REFILL.

## 2012-10-23 NOTE — Telephone Encounter (Signed)
Left message on voicemail to call office. Rx Albuterol was sent to [facility] as requested.

## 2012-11-20 ENCOUNTER — Encounter: Payer: Self-pay | Admitting: Obstetrics & Gynecology

## 2012-11-20 ENCOUNTER — Ambulatory Visit: Payer: Self-pay | Admitting: Obstetrics & Gynecology

## 2012-11-20 DIAGNOSIS — Z01419 Encounter for gynecological examination (general) (routine) without abnormal findings: Secondary | ICD-10-CM

## 2012-11-21 ENCOUNTER — Ambulatory Visit (INDEPENDENT_AMBULATORY_CARE_PROVIDER_SITE_OTHER): Payer: 59 | Admitting: Obstetrics & Gynecology

## 2012-11-21 ENCOUNTER — Encounter: Payer: Self-pay | Admitting: Obstetrics & Gynecology

## 2012-11-21 VITALS — BP 110/66 | Ht 65.5 in | Wt 182.0 lb

## 2012-11-21 DIAGNOSIS — R079 Chest pain, unspecified: Secondary | ICD-10-CM

## 2012-11-21 DIAGNOSIS — Z01419 Encounter for gynecological examination (general) (routine) without abnormal findings: Secondary | ICD-10-CM

## 2012-11-21 DIAGNOSIS — R319 Hematuria, unspecified: Secondary | ICD-10-CM

## 2012-11-21 DIAGNOSIS — Z0000 Encounter for general adult medical examination without abnormal findings: Secondary | ICD-10-CM

## 2012-11-21 LAB — POCT URINALYSIS DIPSTICK: Ketones, UA: NEGATIVE

## 2012-11-21 NOTE — Patient Instructions (Addendum)

## 2012-11-21 NOTE — Progress Notes (Signed)
Patient ID: Kathleen Farley, female   DOB: 07/22/1965, 47 y.o.   MRN: [id number]  47 y.o. G5P0030 SingleCaucasianF here for annual exam.  Has been to ER twice due to chest wall pain.  She was evaluated for a heart attack.  Results reviewed from 09/16/12.  Had EKG, labs, CXR.  Diagnosis was muscle spasm.  Almost went to ER again yesterday.  Pain started while sitting in [employer] not doing anything in particular.  Has never seen a cardiologist.  Does have stress--travels a lot with work, family members live in [place], [place], and she is constantly worried with all the conflict there.  Had MMG two days ago.  Was informed it was normal but she has dense breasts.  Questions about this  No vaginal bleeding.    No LMP recorded. Patient is not currently having periods (Reason: IUD).          Sexually active: yes  The current method of family planning is IUD.    Exercising: yes  yoga, running Smoker:  no  Health Maintenance: Pap:  10/24/11 History of abnormal Pap:  no MMG:  11/19/12 Colonoscopy:  never BMD:   never TDaP:  04/2006 Screening Labs: 10/2011, Hb today: pending, Urine today: trace blood, trace protein, small leuks   reports that she has never smoked. She has never used smokeless tobacco. She reports that she does not drink alcohol or use illicit drugs.  Past Medical History  Diagnosis Date  . ASTHMA 04/02/2007  . 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 Breast lumpectomy Right 1994  . Salpingoophorectomy Right 2004    endometriosis    Current Outpatient Prescriptions  Medication Sig Dispense Refill  . albuterol (PROAIR HFA) 108 (90 BASE) MCG/ACT inhaler Inhale 2 puffs into the lungs every 4 (four) hours as needed for wheezing.  3 each  3  . diazepam (VALIUM) 5 MG tablet Take 0.5 tablets (2.5 mg total) by mouth every 4 (four) hours as needed (muscle spasm).  15 tablet  0  . Fluticasone-Salmeterol (ADVAIR DISKUS) 250-50 MCG/DOSE AEPB Inhale 1  puff into the lungs every 12 (twelve) hours.  180 each  3  . ibuprofen (ADVIL,MOTRIN) 600 MG tablet Take 1 tablet (600 mg total) by mouth every 6 (six) hours as needed for pain.  30 tablet  0  . SUMAtriptan (IMITREX) 100 MG tablet Take 1 tablet (100 mg total) by mouth every 2 (two) hours as needed for migraine.  8 tablet  4   No current facility-administered medications for this visit.    Family History  Problem Relation Age of Onset  . Bone cancer Sister 68    deceased  . Breast cancer Sister 22    lives in [place]  . Hypertension Brother   . Hypertension Brother     ROS:  Pertinent items are noted in HPI.  Otherwise, a comprehensive ROS was negative.  Exam:   BP 110/66  Ht 5' 5.5" (1.664 m)  Wt 182 lb (82.555 kg)  BMI 29.82 kg/m2  Weight change: @WEIGHTCHANGE @ Height:   Height: 5' 5.5" (166.4 cm)  Ht Readings from Last 3 Encounters:  11/21/12 5' 5.5" (1.664 m)    General appearance: alert, cooperative and appears stated age Head: Normocephalic, without obvious abnormality, atraumatic Neck: no adenopathy, supple, symmetrical, trachea midline and thyroid normal to inspection and palpation Lungs: clear to auscultation bilaterally Breasts: normal appearance, no masses or tenderness, small well healed scar left  breast Heart: regular rate and rhythm Abdomen: soft, non-tender; bowel sounds normal; no masses,  no organomegaly Extremities: extremities normal, atraumatic, no cyanosis or edema Skin: Skin color, texture, turgor normal. No rashes or lesions Lymph nodes: Cervical, supraclavicular, and axillary nodes normal. No abnormal inguinal nodes palpated Neurologic: Grossly normal   Pelvic: External genitalia:  no lesions              Urethra:  normal appearing urethra with no masses, tenderness or lesions              Bartholins and Skenes: normal                 Vagina: normal appearing vagina with normal color and discharge, no lesions              Cervix: no lesions               Pap taken: no Bimanual Exam:  Uterus:  normal size, contour, position, consistency, mobility, non-tender              Adnexa: normal adnexa and no mass, fullness, tenderness               Rectovaginal: Confirms               Anus:  normal sphincter tone, no lesions  A:  Well Woman with normal exam Mirena IUD use 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   pap smear with neg HR HPV 5/13.  No Pap today TSH and panel today Cardiology referral.   return annually or prn   An After Visit Summary was printed and given to the patient.

## 2012-11-22 LAB — THYROID PANEL WITH TSH
Free Thyroxine Index: 3.5 (ref 1.0–3.9)
T3 Uptake: 32.8 % (ref 22.5–37.0)
TSH: 0.78 u[IU]/mL (ref 0.350–4.500)

## 2012-11-23 LAB — URINE CULTURE

## 2012-11-24 ENCOUNTER — Telehealth: Payer: Self-pay

## 2012-11-24 MED ORDER — CIPROFLOXACIN HCL 500 MG PO TABS: 500.0000 mg | ORAL_TABLET | Freq: Two times a day (BID) | ORAL | Status: DC

## 2012-11-24 NOTE — Telephone Encounter (Signed)
Patient notified of all results. rx sent to pharmacy.

## 2012-11-25 ENCOUNTER — Telehealth: Payer: Self-pay | Admitting: Orthopedic Surgery

## 2012-11-25 NOTE — Telephone Encounter (Signed)
Spoke with pt about appt with Dr. Eden Emms at [facility] 01-22-13 at 11:45. Pt on cancellation list. Address and phone number given. Pt. Agreeable.

## 2013-01-22 ENCOUNTER — Encounter: Payer: Self-pay | Admitting: Cardiovascular Disease

## 2013-01-22 ENCOUNTER — Ambulatory Visit (INDEPENDENT_AMBULATORY_CARE_PROVIDER_SITE_OTHER): Payer: 59 | Admitting: Cardiovascular Disease

## 2013-01-22 VITALS — BP 120/84 | HR 74 | Wt 176.0 lb

## 2013-01-22 DIAGNOSIS — R071 Chest pain on breathing: Secondary | ICD-10-CM

## 2013-01-22 DIAGNOSIS — R079 Chest pain, unspecified: Secondary | ICD-10-CM

## 2013-01-22 DIAGNOSIS — R0789 Other chest pain: Secondary | ICD-10-CM

## 2013-01-22 NOTE — Patient Instructions (Addendum)

## 2013-01-22 NOTE — Progress Notes (Signed)
Patient ID: Kathleen AISPURO, female   DOB: 1965/08/17, 47 y.o.   MRN: [id number] 47 y.o. Female referred for chest pain  Has been to ER twice due to chest wall pain. She was evaluated for a heart attack. Results reviewed from 09/16/12. Had EKG, labs, CXR. Diagnosis was muscle spasm. Almost went to ER again 5/14 . Pain started while sitting in [employer] not doing anything in particular. Has never seen a cardiologist. Does have stress--travels a lot with work, family members live in [place], [place], and she is constantly worried with all the conflict there.  Pain is clearly sharp and likely from muscle Relief with valium    ROS: Denies fever, malais, weight loss, blurry vision, decreased visual acuity, cough, sputum, SOB, hemoptysis, pleuritic pain, palpitaitons, heartburn, abdominal pain, melena, lower extremity edema, claudication, or rash.  All other systems reviewed and negative   General: Affect appropriate Healthy:  appears stated age HEENT: normal Neck supple with no adenopathy JVP normal no bruits no thyromegaly Lungs clear with no wheezing and good diaphragmatic motion Heart:  S1/S2 no murmur,rub, gallop or click PMI normal Abdomen: benighn, BS positve, no tenderness, no AAA no bruit.  No HSM or HJR Distal pulses intact with no bruits No edema Neuro non-focal Skin warm and dry No muscular weakness  Medications Current Outpatient Prescriptions  Medication Sig Dispense Refill  . albuterol (PROAIR HFA) 108 (90 BASE) MCG/ACT inhaler Inhale 2 puffs into the lungs every 4 (four) hours as needed for wheezing.  3 each  3  . diazepam (VALIUM) 5 MG tablet Take 0.5 tablets (2.5 mg total) by mouth every 4 (four) hours as needed (muscle spasm).  15 tablet  0  . Fluticasone-Salmeterol (ADVAIR DISKUS) 250-50 MCG/DOSE AEPB Inhale 1 puff into the lungs every 12 (twelve) hours.  180 each  3  . ibuprofen (ADVIL,MOTRIN) 600 MG tablet Take 1 tablet (600 mg total) by mouth every 6 (six) hours as needed for  pain.  30 tablet  0  . SUMAtriptan (IMITREX) 100 MG tablet Take 1 tablet (100 mg total) by mouth every 2 (two) hours as needed for migraine.  8 tablet  4   No current facility-administered medications for this visit.    Allergies Review of patient's allergies indicates no known allergies.  Family History: Family History  Problem Relation Age of Onset  . Bone cancer Sister 71    deceased  . Breast cancer Sister 73    lives in [place]  . Hypertension Brother   . Hypertension Brother     Social History: History   Social History  . Marital Status: Single    Spouse Name: N/A    Number of Children: 2  . Years of Education: N/A   Occupational History  .  [employer]   Social History Main Topics  . Smoking status: Never Smoker   . Smokeless tobacco: Never Used  . Alcohol Use: No  . Drug Use: No  . Sexually Active: Yes -- Female partner(s)    Birth Control/ Protection: IUD   Other Topics Concern  . Not on file   Social History Narrative  . No narrative on file    Electrocardiogram: 4/2  SR rate 72 normal ECG   Assessment and Plan

## 2013-01-22 NOTE — Assessment & Plan Note (Signed)
Not likely cardiac But has been to ER twice.  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

## 2013-01-23 ENCOUNTER — Telehealth: Payer: Self-pay | Admitting: Cardiology

## 2013-01-23 NOTE — Telephone Encounter (Signed)
New problem   Pt want to know if she has another panic attack before her test what does she need to do.

## 2013-01-23 NOTE — Telephone Encounter (Signed)
INSTRUCTED  PT  TO CALL PMD   TO SEE IF COULD ORDER SOMETHING TO HELP WITH  ANXIETY ./CY

## 2013-01-23 NOTE — Telephone Encounter (Signed)
Dr nishan pt 

## 2013-02-17 ENCOUNTER — Ambulatory Visit (INDEPENDENT_AMBULATORY_CARE_PROVIDER_SITE_OTHER): Payer: 59 | Admitting: Physician Assistant

## 2013-02-17 DIAGNOSIS — R0789 Other chest pain: Secondary | ICD-10-CM

## 2013-02-17 DIAGNOSIS — R071 Chest pain on breathing: Secondary | ICD-10-CM

## 2013-02-17 DIAGNOSIS — R079 Chest pain, unspecified: Secondary | ICD-10-CM

## 2013-02-17 NOTE — Progress Notes (Signed)
Exercise Treadmill Test  Pre-Exercise Testing Evaluation Rhythm: normal sinus  Rate: 74     Test  Exercise Tolerance Test Ordering MD: Charlton Haws, MD  Interpreting MD: Tereso Newcomer, PA-C  Unique Test No: 1  Treadmill:  1  Indication for ETT: chest pain - rule out ischemia  Contraindication to ETT: No   Stress Modality: exercise - treadmill  Cardiac Imaging Performed: non   Protocol: standard Bruce - maximal  Max BP:  171/83  Max MPHR (bpm):  172 85% MPR (bpm):  146  MPHR obtained (bpm):  157 % MPHR obtained:  91  Reached 85% MPHR (min:sec):  9:00 Total Exercise Time (min-sec):  10:00  Workload in METS:  11.7 Borg Scale: 17  Reason ETT Terminated:  patient's desire to stop    ST Segment Analysis At Rest: normal ST segments - no evidence of significant ST depression With Exercise: non-specific ST changes  Other Information Arrhythmia:  No Angina during ETT:  absent (0) Quality of ETT:  diagnostic  ETT Interpretation:  normal - no evidence of ischemia by ST analysis  Comments: Excellent exercise tolerance. No chest pain. Normal BP response to exercise. No significant ST-T changes to suggest ischemia.   Recommendations: Follow up with Dr. Charlton Haws as directed. Signed,  Tereso Newcomer, PA-C   02/17/2013 11:18 AM

## 2013-06-16 ENCOUNTER — Telehealth: Payer: Self-pay | Admitting: Internal Medicine

## 2013-06-16 NOTE — Telephone Encounter (Signed)
Patient Information:  Caller Name: Lennart Pall  Phone: [phone]  Patient: Kathleen Farley, Kathleen Farley  Gender: Female  DOB: 07/22/1965  Age: 47 Years  PCP: Eleonore Chiquito (Family Practice > 59yrs old)  Pregnant: No  Office Follow Up:  Does the office need to follow up with this patient?: No  Instructions For The Office: N/A  RN Note:  will check schedule for appts  Symptoms  Reason For Call & Symptoms: earache right ear with decreased hearing  started 06/15/13 along with cough and tight breathing due to Asthma, using Albuterol inhaler q 8 hrs with relief  Reviewed Health History In EMR: Yes  Reviewed Medications In EMR: Yes  Reviewed Allergies In EMR: Yes  Reviewed Surgeries / Procedures: Yes  Date of Onset of Symptoms: 06/15/2013  Treatments Tried: Ibuprofen 400mg  Po with partial relief  Treatments Tried Worked: No OB / GYN:  LMP: Unknown  Guideline(s) Used:  Earache  Disposition Per Guideline:   See Today in Office  Reason For Disposition Reached:   All other earaches (Exceptions: earache lasting < 1 hour, and earache from air travel)  Advice Given:  N/A  Patient Will Follow Care Advice:  YES

## 2013-07-24 ENCOUNTER — Telehealth: Payer: Self-pay | Admitting: Internal Medicine

## 2013-07-24 ENCOUNTER — Encounter: Payer: Self-pay | Admitting: Family Medicine

## 2013-07-24 ENCOUNTER — Ambulatory Visit (INDEPENDENT_AMBULATORY_CARE_PROVIDER_SITE_OTHER): Payer: 59 | Admitting: Family Medicine

## 2013-07-24 VITALS — BP 126/74 | HR 74 | Temp 98.5°F | Ht 65.5 in | Wt 187.0 lb

## 2013-07-24 DIAGNOSIS — F411 Generalized anxiety disorder: Secondary | ICD-10-CM | POA: Insufficient documentation

## 2013-07-24 MED ORDER — FLUOXETINE HCL 20 MG PO TABS: 20.0000 mg | ORAL_TABLET | Freq: Every day | ORAL | Status: DC

## 2013-07-24 MED ORDER — ALPRAZOLAM 1 MG PO TABS: 1.0000 mg | ORAL_TABLET | Freq: Three times a day (TID) | ORAL | Status: DC | PRN

## 2013-07-24 NOTE — Telephone Encounter (Signed)
Noted  

## 2013-07-24 NOTE — Progress Notes (Signed)
   Subjective:    Patient ID: Kathleen Farley, female    DOB: April 09, 1966, 48 y.o.   MRN: [id number]  HPI Here asking for help with tightening of her throat and tightness of the neck and left shoulder. This started last year and she went to the ER several times. She saw Dr. Johnsie Cancel and had a full cardiac workup which was normal. It was concluded that these were sx of stress, and indeed she is under a lot of this. She has family members in [place], [place] that are in danger daily from the conflicts there, and recently her brother went into a coma. She gets little information about him and she is very worried. She  Can't sleep, she cries all the time. She was given some Valium last year to try, but this does not help. She has spoken to the EAP program at her job, and she will meet with a therapist for the first time next week.    Review of Systems  Constitutional: Negative.   Respiratory: Positive for chest tightness and shortness of breath. Negative for cough.   Cardiovascular: Positive for chest pain. Negative for palpitations and leg swelling.  Psychiatric/Behavioral: Positive for decreased concentration. Negative for hallucinations, confusion and dysphoric mood. The patient is nervous/anxious.        Objective:   Physical Exam  Constitutional: She appears well-developed and well-nourished.  Neck: Neck supple. No thyromegaly present.  Cardiovascular: Normal rate, regular rhythm, normal heart sounds and intact distal pulses.   Pulmonary/Chest: Effort normal and breath sounds normal.  Lymphadenopathy:    She has no cervical adenopathy.  Psychiatric: Her behavior is normal. Thought content normal.  Tearful           Assessment & Plan:  Her sx are truly related to anxiety. Start on Prozac at 20 mg daily and follow up with Dr. Raliegh Ip in 2-3 weeks. Add Xanax to use prn

## 2013-07-24 NOTE — Telephone Encounter (Signed)
Patient Information:  Caller Name: Mikiala  Phone: [phone]  Patient: Kathleen Farley, Kathleen Farley  Gender: Female  DOB: 1965/10/30  Age: 48 Years  PCP: Bluford Kaufmann (Family Practice > 34yrs old)  Pregnant: No  Office Follow Up:  Does the office need to follow up with this patient?: No  Instructions For The Office: N/A  RN Note:  She has Diazepam from previous ED visit for similar symptoms  "for muscle spasms".  Symptoms  Reason For Call & Symptoms: Patient calling about panic attack. She reports pain in shoulders and that she is having a family crisis. Pain rated at 4 of 10, anterior superior shoulder area.  She also reports headache that varies from 1 up to 9.  Eyes are very tired feeling; this is reported as usual symptom.  Shoulder pain worse with moving neck. Emergent symptoms ruled out.  See Today or Tomorrow in Office per Shoulder Pain guideline due to Patient wants to be seen.  Reviewed Health History In EMR: Yes  Reviewed Medications In EMR: Yes  Reviewed Allergies In EMR: Yes  Reviewed Surgeries / Procedures: Yes  Date of Onset of Symptoms: 07/23/2013  Treatments Tried: Ibuprofen - relief of headache  Treatments Tried Worked: No OB / GYN:  LMP: Unknown  Guideline(s) Used:  Shoulder Pain  Disposition Per Guideline:   See Today or Tomorrow in Office  Reason For Disposition Reached:   Patient wants to be seen  Advice Given:  Reassurance - Shoulder Pain  Usually shoulder pain is not serious. You have told me that there is no redness, numbness, or swelling.  Causes of shoulder pain can include a strained muscle, a forgotten minor injury, and tendinitis.  Call Back If  Moderate pain (e.g. interferes with normal activities) lasts over 3 days  Mild pain lasts over 7 days  You become worse.  Patient Will Follow Care Advice:  YES  Appointment Scheduled:  07/24/2013 13:45:00 Appointment Scheduled Provider:  Alysia Penna [facility])

## 2013-07-24 NOTE — Progress Notes (Signed)
Pre visit review using our clinic review tool, if applicable. No additional management support is needed unless otherwise documented below in the visit note. 

## 2013-09-23 ENCOUNTER — Other Ambulatory Visit: Payer: Self-pay | Admitting: Internal Medicine

## 2013-10-23 ENCOUNTER — Encounter: Payer: Self-pay | Admitting: Internal Medicine

## 2013-10-23 ENCOUNTER — Ambulatory Visit (INDEPENDENT_AMBULATORY_CARE_PROVIDER_SITE_OTHER): Payer: 59 | Admitting: Internal Medicine

## 2013-10-23 ENCOUNTER — Telehealth: Payer: Self-pay | Admitting: Internal Medicine

## 2013-10-23 ENCOUNTER — Ambulatory Visit: Payer: Self-pay | Admitting: Family Medicine

## 2013-10-23 VITALS — BP 110/70 | HR 86 | Temp 97.9°F | Wt 187.0 lb

## 2013-10-23 DIAGNOSIS — J45909 Unspecified asthma, uncomplicated: Secondary | ICD-10-CM

## 2013-10-23 MED ORDER — ALBUTEROL SULFATE HFA 108 (90 BASE) MCG/ACT IN AERS: INHALATION_SPRAY | RESPIRATORY_TRACT | Status: DC

## 2013-10-23 MED ORDER — MONTELUKAST SODIUM 10 MG PO TABS: 10.0000 mg | ORAL_TABLET | Freq: Every day | ORAL | Status: DC

## 2013-10-23 MED ORDER — FLUTICASONE-SALMETEROL 250-50 MCG/DOSE IN AEPB: 1.0000 | INHALATION_SPRAY | Freq: Two times a day (BID) | RESPIRATORY_TRACT | Status: DC

## 2013-10-23 MED ORDER — METHYLPREDNISOLONE ACETATE 80 MG/ML IJ SUSP
120.0000 mg | Freq: Once | INTRAMUSCULAR | Status: AC
  Administered 2013-10-23: 120 mg via INTRAMUSCULAR

## 2013-10-23 NOTE — Progress Notes (Signed)
Subjective:    Patient ID: Kathleen Farley, female    DOB: [birth year], 48 y.o.   MRN: [id number]  HPI 48 year old patient who has a history of chronic asthma.  For the past 24 hours.  She has had increasing shortness of breath and wheezing.  She has had increased albuterol use at times hourly.  Symptoms began yesterday late afternoon, while visiting [place]. She states that even prior to this exacerbation over the past 24 hours.  She has had frequent albuterol use often 2 times daily.  She requires albuterol prior to exercise  Past Medical History  Diagnosis Date  . ASTHMA 04/02/2007  . 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 Marital Status: Single    Spouse Name: N/A    Number of Children: 2  . Years of Education: N/A   Occupational History  .  [employer]   Social History Main Topics  . Smoking status: Never Smoker   . Smokeless tobacco: Never Used  . Alcohol Use: No  . Drug Use: No  . Sexual Activity: Yes    Partners: Male    Birth Control/ Protection: IUD   Other Topics Concern  . Not on file   Social History Narrative  . No narrative on file    Past Surgical History  Procedure Laterality Date  . Breast lumpectomy Right 1994  . Salpingoophorectomy Right 2004    endometriosis    Family History  Problem Relation Age of Onset  . Bone cancer Sister 37    deceased  . Breast cancer Sister 56    lives in [place]  . Hypertension Brother   . Hypertension Brother     No Known Allergies  Current Outpatient Prescriptions on File Prior to Visit  Medication Sig Dispense Refill  . ibuprofen (ADVIL,MOTRIN) 600 MG tablet Take 1 tablet (600 mg total) by mouth every 6 (six) hours as needed for pain.  30 tablet  0   No current facility-administered medications on file prior to visit.    BP 110/70  Pulse 86  Temp(Src) 97.9 F (36.6 C) (Oral)  Wt 187 lb (84.823 kg)  SpO2 97%       Review of Systems  Constitutional: Negative.   HENT:  Negative for congestion, dental problem, hearing loss, rhinorrhea, sinus pressure, sore throat and tinnitus.   Eyes: Negative for pain, discharge and visual disturbance.  Respiratory: Positive for shortness of breath and wheezing. Negative for cough.   Cardiovascular: Negative for chest pain, palpitations and leg swelling.  Gastrointestinal: Negative for nausea, vomiting, abdominal pain, diarrhea, constipation, blood in stool and abdominal distention.  Genitourinary: Negative for dysuria, urgency, frequency, hematuria, flank pain, vaginal bleeding, vaginal discharge, difficulty urinating, vaginal pain and pelvic pain.  Musculoskeletal: Negative for arthralgias, gait problem and joint swelling.  Skin: Negative for rash.  Neurological: Negative for dizziness, syncope, speech difficulty, weakness, numbness and headaches.  Hematological: Negative for adenopathy.  Psychiatric/Behavioral: Negative for behavioral problems, dysphoric mood and agitation. The patient is not nervous/anxious.        Objective:   Physical Exam  Constitutional: She is oriented to person, place, and time. She appears well-developed and well-nourished.  HENT:  Head: Normocephalic.  Right Ear: External ear normal.  Left Ear: External ear normal.  Mouth/Throat: Oropharynx is clear and moist.  Eyes: Conjunctivae and EOM are normal. Pupils are equal, round, and reactive to light.  Neck: Normal range of motion. Neck supple. No thyromegaly  present.  Cardiovascular: Normal rate, regular rhythm, normal heart sounds and intact distal pulses.   No  tachycardia  Pulmonary/Chest: Effort normal and breath sounds normal. No respiratory distress. She has no wheezes. She has no rales.  O2 saturation 97  Abdominal: Soft. Bowel sounds are normal. She exhibits no mass. There is no tenderness.  Musculoskeletal: Normal range of motion.  Lymphadenopathy:    She has no cervical adenopathy.  Neurological: She is alert and oriented to  person, place, and time.  Mild tremor  Skin: Skin is warm and dry. No rash noted.  Psychiatric: She has a normal mood and affect. Her behavior is normal.          Assessment & Plan:  Asthma exacerbation Chronic asthma, not well controlled.  Will treat with Depo-Medrol 80.  We'll add Singulair 10 to her maintenance regimen  Recheck 4-6 weeks or as needed

## 2013-10-23 NOTE — Telephone Encounter (Signed)
Discussed with Dr. Raliegh Ip pt to come in now to be seen. Called pt told her to come in now to be seen with Dr. Raliegh Ip. Pt verbalized understanding.

## 2013-10-23 NOTE — Progress Notes (Signed)
Pre visit review using our clinic review tool, if applicable. No additional management support is needed unless otherwise documented below in the visit note. 

## 2013-10-23 NOTE — Telephone Encounter (Signed)
Patient Information:  Caller Name: Tawsha  Phone: [phone]  Patient: Kathleen Farley, Kathleen Farley  Gender: Female  DOB: 07-18-1965  Age: 48 Years  PCP: Bluford Kaufmann (Family Practice > 20yrs old)  Pregnant: No  Office Follow Up:  Does the office need to follow up with this patient?: Yes  Instructions For The Office: Please call  RN Note:  Pt is having SOB despite using Advair and  Pro Air inhaler. In fact she's using her ProAir q hour making her feel shaky.  Symptoms  Reason For Call & Symptoms: Asthma  Reviewed Health History In EMR: Yes  Reviewed Medications In EMR: Yes  Reviewed Allergies In EMR: Yes  Reviewed Surgeries / Procedures: Yes  Date of Onset of Symptoms: 10/22/2013  Treatments Tried: ProAir  Treatments Tried Worked: No OB / GYN:  LMP: Unknown  Guideline(s) Used:  Asthma Attack  Disposition Per Guideline:   Go to ED Now (or to Office with PCP Approval)  Reason For Disposition Reached:   Hospitalized before with asthma; now feels same  Advice Given:  N/A  Patient Will Follow Care Advice:  YES

## 2013-10-23 NOTE — Patient Instructions (Signed)
Bronchospasm, Adult A bronchospasm is a spasm or tightening of the airways going into the lungs. During a bronchospasm breathing becomes more difficult because the airways get smaller. When this happens there can be coughing, a whistling sound when breathing (wheezing), and difficulty breathing. Bronchospasm is often associated with asthma, but not all patients who experience a bronchospasm have asthma. CAUSES  A bronchospasm is caused by inflammation or irritation of the airways. The inflammation or irritation may be triggered by:   Allergies (such as to animals, pollen, food, or mold). Allergens that cause bronchospasm may cause wheezing immediately after exposure or many hours later.   Infection. Viral infections are believed to be the most common cause of bronchospasm.   Exercise.   Irritants (such as pollution, cigarette smoke, strong odors, aerosol sprays, and paint fumes).   Weather changes. Winds increase molds and pollens in the air. Rain refreshes the air by washing irritants out. Cold air may cause inflammation.   Stress and emotional upset.  SIGNS AND SYMPTOMS   Wheezing.   Excessive nighttime coughing.   Frequent or severe coughing with a simple cold.   Chest tightness.   Shortness of breath.  DIAGNOSIS  Bronchospasm is usually diagnosed through a history and physical exam. Tests, such as chest X-rays, are sometimes done to look for other conditions. TREATMENT   Inhaled medicines can be given to open up your airways and help you breathe. The medicines can be given using either an inhaler or a nebulizer machine.  Corticosteroid medicines may be given for severe bronchospasm, usually when it is associated with asthma. HOME CARE INSTRUCTIONS   Always have a plan prepared for seeking medical care. Know when to call your health care provider and local emergency services (911 in the [place] Know where you can access local emergency care.  Only take medicines as  directed by your health care provider.  If you were prescribed an inhaler or nebulizer machine, ask your health care provider to explain how to use it correctly. Always use a spacer with your inhaler if you were given one.  It is necessary to remain calm during an attack. Try to relax and breathe more slowly.  Control your home environment in the following ways:   Change your heating and air conditioning filter at least once a month.   Limit your use of fireplaces and wood stoves.  Do not smoke and do not allow smoking in your home.   Avoid exposure to perfumes and fragrances.   Get rid of pests (such as roaches and mice) and their droppings.   Throw away plants if you see mold on them.   Keep your house clean and dust free.   Replace carpet with wood, tile, or vinyl flooring. Carpet can trap dander and dust.   Use allergy-proof pillows, mattress covers, and box spring covers.   Wash bed sheets and blankets every week in hot water and dry them in a dryer.   Use blankets that are made of polyester or cotton.   Wash hands frequently. SEEK MEDICAL CARE IF:   You have muscle aches.   You have chest pain.   The sputum changes from clear or white to yellow, green, gray, or bloody.   The sputum you cough up gets thicker.   There are problems that may be related to the medicine you are given, such as a rash, itching, swelling, or trouble breathing.  SEEK IMMEDIATE MEDICAL CARE IF:   You have worsening wheezing and coughing   even after taking your prescribed medicines.   You have increased difficulty breathing.   You develop severe chest pain. MAKE SURE YOU:   Understand these instructions.  Will watch your condition.  Will get help right away if you are not doing well or get worse. Document Released: 06/14/2003 Document Revised: 02/11/2013 Document Reviewed: 12/01/2012 [facility] Patient Information 2014 [place].

## 2013-12-17 ENCOUNTER — Other Ambulatory Visit: Payer: Self-pay | Admitting: Internal Medicine

## 2014-01-08 ENCOUNTER — Encounter: Payer: Self-pay | Admitting: Obstetrics & Gynecology

## 2014-01-08 ENCOUNTER — Ambulatory Visit (INDEPENDENT_AMBULATORY_CARE_PROVIDER_SITE_OTHER): Payer: 59 | Admitting: Obstetrics & Gynecology

## 2014-01-08 VITALS — BP 124/68 | HR 60 | Ht 65.5 in | Wt 181.0 lb

## 2014-01-08 DIAGNOSIS — Z0000 Encounter for general adult medical examination without abnormal findings: Secondary | ICD-10-CM

## 2014-01-08 DIAGNOSIS — Z01419 Encounter for gynecological examination (general) (routine) without abnormal findings: Secondary | ICD-10-CM

## 2014-01-08 DIAGNOSIS — R899 Unspecified abnormal finding in specimens from other organs, systems and tissues: Secondary | ICD-10-CM

## 2014-01-08 DIAGNOSIS — R6889 Other general symptoms and signs: Secondary | ICD-10-CM

## 2014-01-08 DIAGNOSIS — Z124 Encounter for screening for malignant neoplasm of cervix: Secondary | ICD-10-CM

## 2014-01-08 LAB — POCT URINALYSIS DIPSTICK
Bilirubin, UA: NEGATIVE
Glucose, UA: NEGATIVE
Ketones, UA: NEGATIVE
Nitrite, UA: NEGATIVE
PROTEIN UA: NEGATIVE
Urobilinogen, UA: NEGATIVE
pH, UA: 6

## 2014-01-08 LAB — COMPREHENSIVE METABOLIC PANEL
ALBUMIN: 4.4 g/dL (ref 3.5–5.2)
ALT: 21 U/L (ref 0–35)
AST: 22 U/L (ref 0–37)
Alkaline Phosphatase: 69 U/L (ref 39–117)
BUN: 15 mg/dL (ref 6–23)
CALCIUM: 9.9 mg/dL (ref 8.4–10.5)
CO2: 28 meq/L (ref 19–32)
CREATININE: 0.89 mg/dL (ref 0.50–1.10)
Chloride: 103 mEq/L (ref 96–112)
Glucose, Bld: 82 mg/dL (ref 70–99)
POTASSIUM: 4.1 meq/L (ref 3.5–5.3)
SODIUM: 138 meq/L (ref 135–145)
TOTAL PROTEIN: 7.1 g/dL (ref 6.0–8.3)
Total Bilirubin: 0.5 mg/dL (ref 0.2–1.2)

## 2014-01-08 LAB — LIPID PANEL
CHOLESTEROL: 256 mg/dL — AB (ref 0–200)
HDL: 44 mg/dL (ref >39)
LDL Cholesterol: 167 mg/dL — ABNORMAL HIGH (ref 0–99)
TRIGLYCERIDES: 225 mg/dL — AB (ref <150)
Total CHOL/HDL Ratio: 5.8 Ratio
VLDL: 45 mg/dL — AB (ref 0–40)

## 2014-01-08 LAB — TSH: TSH: 1.231 u[IU]/mL (ref 0.350–4.500)

## 2014-01-08 LAB — HEMOGLOBIN, FINGERSTICK: Hemoglobin, fingerstick: 14.5 g/dL (ref 12.0–16.0)

## 2014-01-08 NOTE — Progress Notes (Signed)
48 y.o. G5P0030 SingleCaucasianF here for annual exam.  Family is doing well.  Sister from [place] was her for a month.  She would like her to move to [place].    No LMP recorded. Patient is not currently having periods (Reason: IUD).          Sexually active: Yes.    The current method of family planning is IUD.    Exercising: Yes.    Home exercise routine includes walking 40 mins hrs per day. Smoker:  no  Health Maintenance: Pap:  10/24/11, neg History of abnormal Pap:  no MMG:  12/23/13, WNL Colonoscopy:  never BMD:   never TDaP:  04/2006 Screening Labs: today, Hb today: 14.5, Urine today: wbc trace, rbc trace   reports that she has never smoked. She has never used smokeless tobacco. She reports that she does not drink alcohol or use illicit drugs.  Past Medical History  Diagnosis Date  . ASTHMA 04/02/2007  . 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 Breast lumpectomy Right 1994  . Salpingoophorectomy Right 2004    endometriosis    Current Outpatient Prescriptions  Medication Sig Dispense Refill  . albuterol (PROAIR HFA) 108 (90 BASE) MCG/ACT inhaler INHALE 2 PUFFS INTO THE LUNGS EVERY 4 HOURS AS NEEDED FOR WHEEZING  3 each  0  . PROAIR HFA 108 (90 BASE) MCG/ACT inhaler INHALE 2 PUFFS INTO THE LUNGS EVERY 4 HOURS AS NEEDED FOR WHEEZING  3 each  2  . ibuprofen (ADVIL,MOTRIN) 600 MG tablet Take 1 tablet (600 mg total) by mouth every 6 (six) hours as needed for pain.  30 tablet  0  . montelukast (SINGULAIR) 10 MG tablet Take 1 tablet (10 mg total) by mouth at bedtime.  30 tablet  3   No current facility-administered medications for this visit.    Family History  Problem Relation Age of Onset  . Bone cancer Sister 65    deceased  . Breast cancer Sister 21    lives in [place]  . Hypertension Brother   . Hypertension Brother     ROS:  Pertinent items are noted in HPI.  Otherwise, a comprehensive ROS was negative.  Exam:   BP  124/68  Pulse 60  Ht 5' 5.5" (1.664 m)  Wt 181 lb (82.101 kg)  BMI 29.65 kg/m2   Height: 5' 5.5" (166.4 cm)  Ht Readings from Last 3 Encounters:  01/08/14 5' 5.5" (1.664 m)  07/24/13 5' 5.5" (1.664 m)  11/21/12 5' 5.5" (1.664 m)    General appearance: alert, cooperative and appears stated age Head: Normocephalic, without obvious abnormality, atraumatic Neck: no adenopathy, supple, symmetrical, trachea midline and thyroid normal to inspection and palpation Lungs: clear to auscultation bilaterally Breasts: normal appearance, no masses or tenderness Heart: regular rate and rhythm Abdomen: soft, non-tender; bowel sounds normal; no masses,  no organomegaly Extremities: extremities normal, atraumatic, no cyanosis or edema Skin: Skin color, texture, turgor normal. No rashes or lesions Lymph nodes: Cervical, supraclavicular, and axillary nodes normal. No abnormal inguinal nodes palpated Neurologic: Grossly normal   Pelvic: External genitalia:  no lesions              Urethra:  normal appearing urethra with no masses, tenderness or lesions              Bartholins and Skenes: normal  Vagina: normal appearing vagina with normal color and discharge, no lesions              Cervix: no lesions              Pap taken: Yes.   Bimanual Exam:  Uterus:  normal size, contour, position, consistency, mobility, non-tender              Adnexa: normal adnexa and no mass, fullness, tenderness               Rectovaginal: Confirms               Anus:  normal sphincter tone, no lesions  A:  Well Woman with normal exam  Mirena IUD use.  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  3D discussed with pt. pap smear with neg HR HPV 5/13. Pap today. TSH, CMP, Vit D, lipids return annually or prn   An After Visit Summary was printed and given to the patient.

## 2014-01-09 LAB — VITAMIN D 25 HYDROXY (VIT D DEFICIENCY, FRACTURES): Vit D, 25-Hydroxy: 21 ng/mL — ABNORMAL LOW (ref 30–89)

## 2014-01-10 NOTE — Addendum Note (Signed)
Addended by: Megan Salon on: 01/10/2014 01:40 PM   Modules accepted: Orders

## 2014-01-11 ENCOUNTER — Telehealth: Payer: Self-pay

## 2014-01-11 MED ORDER — VITAMIN D (ERGOCALCIFEROL) 1.25 MG (50000 UNIT) PO CAPS: 50000.0000 [IU] | ORAL_CAPSULE | ORAL | Status: DC

## 2014-01-11 NOTE — Telephone Encounter (Signed)
Informed pt of results below. She agreed and voiced understanding. Pt will make an lab appt for 3 mths. rx sent to [facility] Encounter closed

## 2014-01-11 NOTE — Telephone Encounter (Signed)
Message copied by Gerda Diss on Mon Jan 11, 2014 12:38 PM ------      Message from: Megan Salon      Created: Sun Jan 10, 2014  1:39 PM       Inform cmp and tsh are normal.  Cholesterol is elevated.  I don't think she was fasting for this test.  Probably should repeat.  As well, Vit D is low.  Start 50K IU weekly and retest in three months.  She can do the fasting lipids and Vit D again in three months.  Order placed for labs.  I have not placed the Vit D medication as I didn't want her to get a call from the pharmacy first. ------

## 2014-01-11 NOTE — Telephone Encounter (Signed)
lmom to contact office

## 2014-01-11 NOTE — Telephone Encounter (Signed)
Returning a call to Shannon.

## 2014-01-12 LAB — IPS PAP TEST WITH REFLEX TO HPV

## 2014-01-20 ENCOUNTER — Telehealth: Payer: Self-pay | Admitting: Internal Medicine

## 2014-01-20 NOTE — Telephone Encounter (Signed)
Caller: Kathleen Farley/Patient; Phone: [phone]; Reason for Call: Leaving tomorrow night to go on a cruise 01/23/14; looking for a med/patch that will help prevent sea sickness; uses [facility] on [address]; call with any questions

## 2014-01-21 MED ORDER — SCOPOLAMINE 1 MG/3DAYS TD PT72: 1.0000 | MEDICATED_PATCH | TRANSDERMAL | Status: DC

## 2014-01-21 NOTE — Telephone Encounter (Signed)
Pt is leaving for [place]

## 2014-01-21 NOTE — Addendum Note (Signed)
Addended by: Marian Sorrow on: 01/21/2014 03:18 PM   Modules accepted: Orders

## 2014-01-21 NOTE — Telephone Encounter (Signed)
Discussed with Dr. Yong Channel okay to order Transderm-scop patch for pt.

## 2014-01-21 NOTE — Telephone Encounter (Signed)
Spoke to pt, told her Rx for Transderm-scop patch sent to pharmacy. Pt verbalized understanding.

## 2014-01-21 NOTE — Telephone Encounter (Signed)
Dr. Yong Channel, could you please prescribe patch for sea sickness for pt.

## 2014-01-21 NOTE — Telephone Encounter (Signed)
Please see message, closed by mistake.

## 2014-03-08 ENCOUNTER — Other Ambulatory Visit: Payer: Self-pay | Admitting: Internal Medicine

## 2014-03-11 ENCOUNTER — Telehealth: Payer: Self-pay | Admitting: Obstetrics & Gynecology

## 2014-03-11 ENCOUNTER — Ambulatory Visit (INDEPENDENT_AMBULATORY_CARE_PROVIDER_SITE_OTHER): Payer: 59 | Admitting: Obstetrics & Gynecology

## 2014-03-11 ENCOUNTER — Ambulatory Visit (INDEPENDENT_AMBULATORY_CARE_PROVIDER_SITE_OTHER): Payer: 59

## 2014-03-11 VITALS — BP 100/70 | Ht 65.5 in | Wt 180.0 lb

## 2014-03-11 DIAGNOSIS — R1032 Left lower quadrant pain: Secondary | ICD-10-CM

## 2014-03-11 DIAGNOSIS — M25552 Pain in left hip: Secondary | ICD-10-CM

## 2014-03-11 DIAGNOSIS — M25559 Pain in unspecified hip: Secondary | ICD-10-CM

## 2014-03-11 NOTE — Progress Notes (Signed)
48 y.o.Singlefemale here for a pelvic ultrasound due to LLQ pain.  Pt states has been going on for a few months but seems to have gotten acutely worse.  Not really taking anything for pain.  Hurts worse with sitting and walking.  Pt states this is in her left pelvis but I did ask her to stand and point to location of most significant pain and it is in her left groin.  Can reproduce pain by asking pt to flex left leg at the hip.  Minimal pain with extension.  Pt states lying flat does relieve the pain so she is able to rest.  States she does not want pain medication but desires the pain to get better.  She called my office first for evaluation as she has an IUD and felt this could be the cause.    No LMP recorded. Patient is not currently having periods (Reason: IUD).  Sexually active:  yes  Contraception: IUD  FINDINGS: UTERUS: 5.9 x 4.5 x 3.3cm, IUD in correct location EMS: 3.39mm, symmetric ADNEXA:   Left ovary 2.4 x 1.0 x 2.0cm   Right ovary absent CUL DE SAC: no free fluid seen  Reviewed results with pt.  I really feel this is ortho related as the pain is reproduced with flexion at the left hip.  Ovary on this side is completley normal and IUD is in correct location.  Assessment:  Left hip pain, worse with flexion at hip Mirena IUD in correct location H/O RSO  Plan: Celebrex 200mg  daily.  Office samples given to pt.   Referral to ortho.  ~15 minutes spent with patient >50% of time was in face to face discussion of above.

## 2014-03-11 NOTE — Telephone Encounter (Signed)
Pt is having pain in her lower abdomin for about two weeks

## 2014-03-11 NOTE — Telephone Encounter (Signed)
Return call to patient. Reports left lower abdominal pain for 2 months. Was not present when seen for AEX 01-08-14. Intermittent pain that usually resolves with bedrest. Does not take any medication for it. Increased last night with pain scale of 7-8/10.  Mirena IUD in place. Does not have menstrual cycles.   Denies fever, nausea and vomiting and urinary symptoms.   Advised will review with MD and call her back.

## 2014-03-11 NOTE — Telephone Encounter (Signed)
Spoke with patient. Advised that per benefit quote received, she will be responsible for $309.50 when she comes in for PUS today. Explained that this is due to her deductible not being met. Once deductible is met, plan pays 80/20. Patient agreeable.

## 2014-03-11 NOTE — Telephone Encounter (Signed)
Reviewed with Dr Sabra Heck. PUS ordered for today. Patient notified, appt today at [place] to provider for final review. Patient agreeable to disposition. Will close encounter

## 2014-03-12 ENCOUNTER — Telehealth: Payer: Self-pay | Admitting: Obstetrics & Gynecology

## 2014-03-12 NOTE — Telephone Encounter (Signed)
Spoke with patient. Advised that she is scheduled  Monday, Sept 21 @ 0945 with Dr Alfonso Ramus

## 2014-03-14 ENCOUNTER — Encounter: Payer: Self-pay | Admitting: Obstetrics & Gynecology

## 2014-04-06 ENCOUNTER — Encounter (HOSPITAL_BASED_OUTPATIENT_CLINIC_OR_DEPARTMENT_OTHER): Payer: Self-pay | Admitting: Emergency Medicine

## 2014-04-06 ENCOUNTER — Emergency Department (HOSPITAL_BASED_OUTPATIENT_CLINIC_OR_DEPARTMENT_OTHER): Payer: 59

## 2014-04-06 ENCOUNTER — Emergency Department (HOSPITAL_BASED_OUTPATIENT_CLINIC_OR_DEPARTMENT_OTHER)
Admission: EM | Admit: 2014-04-06 | Discharge: 2014-04-06 | Disposition: A | Discharge status: Home / Self Care | Payer: 59 | Attending: Emergency Medicine | Admitting: Emergency Medicine

## 2014-04-06 DIAGNOSIS — Z87448 Personal history of other diseases of urinary system: Secondary | ICD-10-CM | POA: Insufficient documentation

## 2014-04-06 DIAGNOSIS — J45901 Unspecified asthma with (acute) exacerbation: Secondary | ICD-10-CM | POA: Diagnosis not present

## 2014-04-06 DIAGNOSIS — Z79899 Other long term (current) drug therapy: Secondary | ICD-10-CM | POA: Insufficient documentation

## 2014-04-06 DIAGNOSIS — J9801 Acute bronchospasm: Secondary | ICD-10-CM

## 2014-04-06 DIAGNOSIS — R0602 Shortness of breath: Secondary | ICD-10-CM | POA: Diagnosis present

## 2014-04-06 DIAGNOSIS — H81399 Other peripheral vertigo, unspecified ear: Secondary | ICD-10-CM | POA: Diagnosis not present

## 2014-04-06 LAB — BASIC METABOLIC PANEL
Anion gap: 14 (ref 5–15)
BUN: 9 mg/dL (ref 6–23)
CO2: 24 meq/L (ref 19–32)
Calcium: 9.5 mg/dL (ref 8.4–10.5)
Chloride: 104 mEq/L (ref 96–112)
Creatinine, Ser: 0.7 mg/dL (ref 0.50–1.10)
GFR calc Af Amer: >90 mL/min (ref >90)
GFR calc non Af Amer: >90 mL/min (ref >90)
Glucose, Bld: 158 mg/dL — ABNORMAL HIGH (ref 70–99)
POTASSIUM: 4.2 meq/L (ref 3.7–5.3)
SODIUM: 142 meq/L (ref 137–147)

## 2014-04-06 LAB — CBC WITH DIFFERENTIAL/PLATELET
Basophils Absolute: 0 10*3/uL (ref 0.0–0.1)
Basophils Relative: 0 % (ref 0–1)
EOS ABS: 0 10*3/uL (ref 0.0–0.7)
Eosinophils Relative: 0 % (ref 0–5)
HCT: 40.4 % (ref 36.0–46.0)
HEMOGLOBIN: 13.5 g/dL (ref 12.0–15.0)
LYMPHS PCT: 6 % — AB (ref 12–46)
Lymphs Abs: 0.8 10*3/uL (ref 0.7–4.0)
MCH: 28.7 pg (ref 26.0–34.0)
MCHC: 33.4 g/dL (ref 30.0–36.0)
MCV: 85.8 fL (ref 78.0–100.0)
MONOS PCT: 2 % — AB (ref 3–12)
Monocytes Absolute: 0.3 10*3/uL (ref 0.1–1.0)
NEUTROS PCT: 92 % — AB (ref 43–77)
Neutro Abs: 11 10*3/uL — ABNORMAL HIGH (ref 1.7–7.7)
PLATELETS: 217 10*3/uL (ref 150–400)
RBC: 4.71 MIL/uL (ref 3.87–5.11)
RDW: 12.3 % (ref 11.5–15.5)
WBC: 12.1 10*3/uL — AB (ref 4.0–10.5)

## 2014-04-06 MED ORDER — LORAZEPAM 1 MG PO TABS: 1.0000 mg | ORAL_TABLET | Freq: Three times a day (TID) | ORAL | Status: DC | PRN

## 2014-04-06 MED ORDER — METHYLPREDNISOLONE SODIUM SUCC 125 MG IJ SOLR
125.0000 mg | Freq: Once | INTRAMUSCULAR | Status: AC
  Administered 2014-04-06: 125 mg via INTRAVENOUS
  Filled 2014-04-06: qty 2

## 2014-04-06 MED ORDER — IPRATROPIUM-ALBUTEROL 0.5-2.5 (3) MG/3ML IN SOLN
3.0000 mL | Freq: Once | RESPIRATORY_TRACT | Status: AC
Start: 2014-04-06 — End: 2014-04-06
  Administered 2014-04-06: 3 mL via RESPIRATORY_TRACT
  Filled 2014-04-06: qty 3

## 2014-04-06 MED ORDER — MECLIZINE HCL 25 MG PO TABS
25.0000 mg | ORAL_TABLET | Freq: Once | ORAL | Status: AC
  Administered 2014-04-06: 25 mg via ORAL
  Filled 2014-04-06: qty 1

## 2014-04-06 MED ORDER — LORAZEPAM 2 MG/ML IJ SOLN
1.0000 mg | Freq: Once | INTRAMUSCULAR | Status: AC
  Administered 2014-04-06: 1 mg via INTRAVENOUS
  Filled 2014-04-06: qty 1

## 2014-04-06 MED ORDER — METOCLOPRAMIDE HCL 5 MG/ML IJ SOLN
10.0000 mg | Freq: Once | INTRAMUSCULAR | Status: AC
  Administered 2014-04-06: 10 mg via INTRAVENOUS
  Filled 2014-04-06: qty 2

## 2014-04-06 MED ORDER — SODIUM CHLORIDE 0.9 % IV SOLN
1000.0000 mL | Freq: Once | INTRAVENOUS | Status: AC
  Administered 2014-04-06: 1000 mL via INTRAVENOUS

## 2014-04-06 MED ORDER — PREDNISONE 50 MG PO TABS: 50.0000 mg | ORAL_TABLET | Freq: Every day | ORAL | Status: DC

## 2014-04-06 MED ORDER — MECLIZINE HCL 25 MG PO TABS: 25.0000 mg | ORAL_TABLET | Freq: Three times a day (TID) | ORAL | Status: DC | PRN

## 2014-04-06 MED ORDER — ONDANSETRON HCL 4 MG/2ML IJ SOLN
4.0000 mg | Freq: Once | INTRAMUSCULAR | Status: AC
  Administered 2014-04-06: 4 mg via INTRAVENOUS
  Filled 2014-04-06: qty 2

## 2014-04-06 MED ORDER — METOCLOPRAMIDE HCL 10 MG PO TABS: 10.0000 mg | ORAL_TABLET | Freq: Four times a day (QID) | ORAL | Status: DC | PRN

## 2014-04-06 NOTE — Discharge Instructions (Signed)
Asthma Asthma is a recurring condition in which the airways tighten and narrow. Asthma can make it difficult to breathe. It can cause coughing, wheezing, and shortness of breath. Asthma episodes, also called asthma attacks, range from minor to life-threatening. Asthma cannot be cured, but medicines and lifestyle changes can help control it. CAUSES Asthma is believed to be caused by inherited (genetic) and environmental factors, but its exact cause is unknown. Asthma may be triggered by allergens, lung infections, or irritants in the air. Asthma triggers are different for each person. Common triggers include:   Animal dander.  Dust mites.  Cockroaches.  Pollen from trees or grass.  Mold.  Smoke.  Air pollutants such as dust, household cleaners, hair sprays, aerosol sprays, paint fumes, strong chemicals, or strong odors.  Cold air, weather changes, and winds (which increase molds and pollens in the air).  Strong emotional expressions such as crying or laughing hard.  Stress.  Certain medicines (such as aspirin) or types of drugs (such as beta-blockers).  Sulfites in foods and drinks. Foods and drinks that may contain sulfites include dried fruit, potato chips, and sparkling grape juice.  Infections or inflammatory conditions such as the flu, a cold, or an inflammation of the nasal membranes (rhinitis).  Gastroesophageal reflux disease (GERD).  Exercise or strenuous activity. SYMPTOMS Symptoms may occur immediately after asthma is triggered or many hours later. Symptoms include:  Wheezing.  Excessive nighttime or early morning coughing.  Frequent or severe coughing with a common cold.  Chest tightness.  Shortness of breath. DIAGNOSIS  The diagnosis of asthma is made by a review of your medical history and a physical exam. Tests may also be performed. These may include:  Lung function studies. These tests show how much air you breathe in and out.  Allergy  tests.  Imaging tests such as X-rays. TREATMENT  Asthma cannot be cured, but it can usually be controlled. Treatment involves identifying and avoiding your asthma triggers. It also involves medicines. There are 2 classes of medicine used for asthma treatment:   Controller medicines. These prevent asthma symptoms from occurring. They are usually taken every day.  Reliever or rescue medicines. These quickly relieve asthma symptoms. They are used as needed and provide short-term relief. Your health care provider will help you create an asthma action plan. An asthma action plan is a written plan for managing and treating your asthma attacks. It includes a list of your asthma triggers and how they may be avoided. It also includes information on when medicines should be taken and when their dosage should be changed. An action plan may also involve the use of a device called a peak flow meter. A peak flow meter measures how well the lungs are working. It helps you monitor your condition. HOME CARE INSTRUCTIONS   Take medicines only as directed by your health care provider. Speak with your health care provider if you have questions about how or when to take the medicines.  Use a peak flow meter as directed by your health care provider. Record and keep track of readings.  Understand and use the action plan to help minimize or stop an asthma attack without needing to seek medical care.  Control your home environment in the following ways to help prevent asthma attacks:  Do not smoke. Avoid being exposed to secondhand smoke.  Change your heating and air conditioning filter regularly.  Limit your use of fireplaces and wood stoves.  Get rid of pests (such as roaches and  mice) and their droppings.  Throw away plants if you see mold on them.  Clean your floors and dust regularly. Use unscented cleaning products.  Try to have someone else vacuum for you regularly. Stay out of rooms while they are  being vacuumed and for a short while afterward. If you vacuum, use a dust mask from a hardware store, a double-layered or microfilter vacuum cleaner bag, or a vacuum cleaner with a HEPA filter.  Replace carpet with wood, tile, or vinyl flooring. Carpet can trap dander and dust.  Use allergy-proof pillows, mattress covers, and box spring covers.  Wash bed sheets and blankets every week in hot water and dry them in a dryer.  Use blankets that are made of polyester or cotton.  Clean bathrooms and kitchens with bleach. If possible, have someone repaint the walls in these rooms with mold-resistant paint. Keep out of the rooms that are being cleaned and painted.  Wash hands frequently. SEEK MEDICAL CARE IF:   You have wheezing, shortness of breath, or a cough even if taking medicine to prevent attacks.  The colored mucus you cough up (sputum) is thicker than usual.  Your sputum changes from clear or white to yellow, green, gray, or bloody.  You have any problems that may be related to the medicines you are taking (such as a rash, itching, swelling, or trouble breathing).  You are using a reliever medicine more than 2-3 times per week.  Your peak flow is still at 50-79% of your personal best after following your action plan for 1 hour.  You have a fever. SEEK IMMEDIATE MEDICAL CARE IF:   You seem to be getting worse and are unresponsive to treatment during an asthma attack.  You are short of breath even at rest.  You get short of breath when doing very little physical activity.  You have difficulty eating, drinking, or talking due to asthma symptoms.  You develop chest pain.  You develop a fast heartbeat.  You have a bluish color to your lips or fingernails.  You are light-headed, dizzy, or faint.  Your peak flow is less than 50% of your personal best. MAKE SURE YOU:   Understand these instructions.  Will watch your condition.  Will get help right away if you are not  doing well or get worse. Document Released: 06/11/2005 Document Revised: 10/26/2013 Document Reviewed: 01/08/2013 [facility] Patient Information 2015 [place], [place]. This information is not intended to replace advice given to you by your health care provider. Make sure you discuss any questions you have with your health care provider.  Vertigo Vertigo means you feel like you or your surroundings are moving when they are not. Vertigo can be dangerous if it occurs when you are at work, driving, or performing difficult activities.  CAUSES  Vertigo occurs when there is a conflict of signals sent to your brain from the visual and sensory systems in your body. There are many different causes of vertigo, including:  Infections, especially in the inner ear.  A bad reaction to a drug or misuse of alcohol and medicines.  Withdrawal from drugs or alcohol.  Rapidly changing positions, such as lying down or rolling over in bed.  A migraine headache.  Decreased blood flow to the brain.  Increased pressure in the brain from a head injury, infection, tumor, or bleeding. SYMPTOMS  You may feel as though the world is spinning around or you are falling to the ground. Because your balance is upset, vertigo can  cause nausea and vomiting. You may have involuntary eye movements (nystagmus). DIAGNOSIS  Vertigo is usually diagnosed by physical exam. If the cause of your vertigo is unknown, your caregiver may perform imaging tests, such as an MRI scan (magnetic resonance imaging). TREATMENT  Most cases of vertigo resolve on their own, without treatment. Depending on the cause, your caregiver may prescribe certain medicines. If your vertigo is related to body position issues, your caregiver may recommend movements or procedures to correct the problem. In rare cases, if your vertigo is caused by certain inner ear problems, you may need surgery. HOME CARE INSTRUCTIONS   Follow your caregiver's instructions.  Avoid  driving.  Avoid operating heavy machinery.  Avoid performing any tasks that would be dangerous to you or others during a vertigo episode.  Tell your caregiver if you notice that certain medicines seem to be causing your vertigo. Some of the medicines used to treat vertigo episodes can actually make them worse in some people. SEEK IMMEDIATE MEDICAL CARE IF:   Your medicines do not relieve your vertigo or are making it worse.  You develop problems with talking, walking, weakness, or using your arms, hands, or legs.  You develop severe headaches.  Your nausea or vomiting continues or gets worse.  You develop visual changes.  A family member notices behavioral changes.  Your condition gets worse. MAKE SURE YOU:  Understand these instructions.  Will watch your condition.  Will get help right away if you are not doing well or get worse. Document Released: 03/21/2005 Document Revised: 09/03/2011 Document Reviewed: 12/28/2010 [facility] Patient Information 2015 [place], [place]. This information is not intended to replace advice given to you by your health care provider. Make sure you discuss any questions you have with your health care provider.  Meclizine tablets or capsules What is this medicine? MECLIZINE (MEK li zeen) is an antihistamine. It is used to prevent nausea, vomiting, or dizziness caused by motion sickness. It is also used to prevent and treat vertigo (extreme dizziness or a feeling that you or your surroundings are tilting or spinning around). This medicine may be used for other purposes; ask your health care provider or pharmacist if you have questions. COMMON BRAND NAME(S): Antivert, Dramamine Less Drowsy, Medivert, Meni-D What should I tell my health care provider before I take this medicine? They need to know if you have any of these conditions: -asthma -glaucoma -prostate trouble -stomach problems -urinary problems -an unusual or allergic reaction to meclizine,  other medicines, foods, dyes, or preservatives -pregnant or trying to get pregnant -breast-feeding How should I use this medicine? Take this medicine by mouth with a glass of water. Follow the directions on the prescription label. If you are using this medicine to prevent motion sickness, take the dose at least 1 hour before travel. If it upsets your stomach, take it with food or milk. Take your doses at regular intervals. Do not take your medicine more often than directed. Talk to your pediatrician regarding the use of this medicine in children. Special care may be needed. Overdosage: If you think you have taken too much of this medicine contact a poison control center or emergency room at once. NOTE: This medicine is only for you. Do not share this medicine with others. What if I miss a dose? If you miss a dose, take it as soon as you can. If it is almost time for your next dose, take only that dose. Do not take double or extra doses. What may  interact with this medicine? -barbiturate medicines for inducing sleep or treating seizures -digoxin -medicines for anxiety or sleeping problems, like alprazolam, diazepam or temazepam -medicines for hay fever and other allergies -medicines for mental depression -medicines for movement abnormalities as in Parkinson's disease, or for stomach problems -medicines for pain -medicines that relax muscles This list may not describe all possible interactions. Give your health care provider a list of all the medicines, herbs, non-prescription drugs, or dietary supplements you use. Also tell them if you smoke, drink alcohol, or use illegal drugs. Some items may interact with your medicine. What should I watch for while using this medicine? If you are taking this medicine on a regular schedule, visit your doctor or health care professional for regular checks on your progress. You may get dizzy, drowsy or have blurred vision. Do not drive, use machinery, or do  anything that needs mental alertness until you know how this medicine affects you. Do not stand or sit up quickly, especially if you are an older patient. This reduces the risk of dizzy or fainting spells. Alcohol can increase possible dizziness. Avoid alcoholic drinks. Your mouth may get dry. Chewing sugarless gum or sucking hard candy, and drinking plenty of water may help. Contact your doctor if the problem does not go away or is severe. This medicine may cause dry eyes and blurred vision. If you wear contact lenses you may feel some discomfort. Lubricating drops may help. See your eye doctor if the problem does not go away or is severe. What side effects may I notice from receiving this medicine? Side effects that you should report to your doctor or health care professional as soon as possible: -fainting spells -fast or irregular heartbeat Side effects that usually do not require medical attention (report to your doctor or health care professional if they continue or are bothersome): -constipation -difficulty passing urine -difficulty sleeping -headache -stomach upset This list may not describe all possible side effects. Call your doctor for medical advice about side effects. You may report side effects to FDA at 1-800-FDA-1088. Where should I keep my medicine? Keep out of the reach of children. Store at room temperature between 15 and 30 degrees C (59 and 86 degrees F). Keep container tightly closed. Throw away any unused medicine after the expiration date. NOTE: This sheet is a summary. It may not cover all possible information. If you have questions about this medicine, talk to your doctor, pharmacist, or health care provider.  2015, [employer] Standard. (2007-12-18 10:35:36)  Lorazepam tablets What is this medicine? LORAZEPAM (lor A ze pam) is a benzodiazepine. It is used to treat anxiety. This medicine may be used for other purposes; ask your health care provider or pharmacist if  you have questions. COMMON BRAND NAME(S): Ativan What should I tell my health care provider before I take this medicine? They need to know if you have any of these conditions: -alcohol or drug abuse problem -bipolar disorder, depression, psychosis or other mental health condition -glaucoma -kidney or liver disease -lung disease or breathing difficulties -myasthenia gravis -Parkinson's disease -seizures or a history of seizures -suicidal thoughts -an unusual or allergic reaction to lorazepam, other benzodiazepines, foods, dyes, or preservatives -pregnant or trying to get pregnant -breast-feeding How should I use this medicine? Take this medicine by mouth with a glass of water. Follow the directions on the prescription label. If it upsets your stomach, take it with food or milk. Take your medicine at regular intervals. Do not take it more  often than directed. Do not stop taking except on the advice of your doctor or health care professional. Talk to your pediatrician regarding the use of this medicine in children. Special care may be needed. Overdosage: If you think you have taken too much of this medicine contact a poison control center or emergency room at once. NOTE: This medicine is only for you. Do not share this medicine with others. What if I miss a dose? If you miss a dose, take it as soon as you can. If it is almost time for your next dose, take only that dose. Do not take double or extra doses. What may interact with this medicine? -barbiturate medicines for inducing sleep or treating seizures, like phenobarbital -clozapine -medicines for depression, mental problems or psychiatric disturbances -medicines for sleep -phenytoin -probenecid -theophylline -valproic acid This list may not describe all possible interactions. Give your health care provider a list of all the medicines, herbs, non-prescription drugs, or dietary supplements you use. Also tell them if you smoke, drink  alcohol, or use illegal drugs. Some items may interact with your medicine. What should I watch for while using this medicine? Visit your doctor or health care professional for regular checks on your progress. Your body may become dependent on this medicine, ask your doctor or health care professional if you still need to take it. However, if you have been taking this medicine regularly for some time, do not suddenly stop taking it. You must gradually reduce the dose or you may get severe side effects. Ask your doctor or health care professional for advice before increasing or decreasing the dose. Even after you stop taking this medicine it can still affect your body for several days. You may get drowsy or dizzy. Do not drive, use machinery, or do anything that needs mental alertness until you know how this medicine affects you. To reduce the risk of dizzy and fainting spells, do not stand or sit up quickly, especially if you are an older patient. Alcohol may increase dizziness and drowsiness. Avoid alcoholic drinks. Do not treat yourself for coughs, colds or allergies without asking your doctor or health care professional for advice. Some ingredients can increase possible side effects. What side effects may I notice from receiving this medicine? Side effects that you should report to your doctor or health care professional as soon as possible: -changes in vision -confusion -depression -mood changes, excitability or aggressive behavior -movement difficulty, staggering or jerky movements -muscle cramps -restlessness -weakness or tiredness Side effects that usually do not require medical attention (report to your doctor or health care professional if they continue or are bothersome): -constipation or diarrhea -difficulty sleeping, nightmares -dizziness, drowsiness -headache -nausea, vomiting This list may not describe all possible side effects. Call your doctor for medical advice about side  effects. You may report side effects to FDA at 1-800-FDA-1088. Where should I keep my medicine? Keep out of the reach of children. This medicine can be abused. Keep your medicine in a safe place to protect it from theft. Do not share this medicine with anyone. Selling or giving away this medicine is dangerous and against the law. Store at room temperature between 20 and 25 degrees C (68 and 77 degrees F). Protect from light. Keep container tightly closed. Throw away any unused medicine after the expiration date. NOTE: This sheet is a summary. It may not cover all possible information. If you have questions about this medicine, talk to your doctor, pharmacist, or health care provider.  2015, [employer]/Gold Standard. (2007-12-12 14:58:20)  Metoclopramide tablets What is this medicine? METOCLOPRAMIDE (met oh kloe PRA mide) is used to treat the symptoms of gastroesophageal reflux disease (GERD) like heartburn. It is also used to treat people with slow emptying of the stomach and intestinal tract. This medicine may be used for other purposes; ask your health care provider or pharmacist if you have questions. COMMON BRAND NAME(S): Reglan What should I tell my health care provider before I take this medicine? They need to know if you have any of these conditions: -breast cancer -depression -diabetes -heart failure -high blood pressure -kidney disease -liver disease -Parkinson's disease or a movement disorder -pheochromocytoma -seizures -stomach obstruction, bleeding, or perforation -an unusual or allergic reaction to metoclopramide, procainamide, sulfites, other medicines, foods, dyes, or preservatives -pregnant or trying to get pregnant -breast-feeding How should I use this medicine? Take this medicine by mouth with a glass of water. Follow the directions on the prescription label. Take this medicine on an empty stomach, about 30 minutes before eating. Take your doses at regular intervals. Do  not take your medicine more often than directed. Do not stop taking except on the advice of your doctor or health care professional. A special MedGuide will be given to you by the pharmacist with each prescription and refill. Be sure to read this information carefully each time. Talk to your pediatrician regarding the use of this medicine in children. Special care may be needed. Overdosage: If you think you have taken too much of this medicine contact a poison control center or emergency room at once. NOTE: This medicine is only for you. Do not share this medicine with others. What if I miss a dose? If you miss a dose, take it as soon as you can. If it is almost time for your next dose, take only that dose. Do not take double or extra doses. What may interact with this medicine? -acetaminophen -cyclosporine -digoxin -medicines for blood pressure -medicines for diabetes, including insulin -medicines for hay fever and other allergies -medicines for depression, especially an Monoamine Oxidase Inhibitor (MAOI) -medicines for Parkinson's disease, like levodopa -medicines for sleep or for pain -tetracycline This list may not describe all possible interactions. Give your health care provider a list of all the medicines, herbs, non-prescription drugs, or dietary supplements you use. Also tell them if you smoke, drink alcohol, or use illegal drugs. Some items may interact with your medicine. What should I watch for while using this medicine? It may take a few weeks for your stomach condition to start to get better. However, do not take this medicine for longer than 12 weeks. The longer you take this medicine, and the more you take it, the greater your chances are of developing serious side effects. If you are an elderly patient, a female patient, or you have diabetes, you may be at an increased risk for side effects from this medicine. Contact your doctor immediately if you start having movements you  cannot control such as lip smacking, rapid movements of the tongue, involuntary or uncontrollable movements of the eyes, head, arms and legs, or muscle twitches and spasms. Patients and their families should watch out for worsening depression or thoughts of suicide. Also watch out for any sudden or severe changes in feelings such as feeling anxious, agitated, panicky, irritable, hostile, aggressive, impulsive, severely restless, overly excited and hyperactive, or not being able to sleep. If this happens, especially at the beginning of treatment or after a change in dose,  call your doctor. Do not treat yourself for high fever. Ask your doctor or health care professional for advice. You may get drowsy or dizzy. Do not drive, use machinery, or do anything that needs mental alertness until you know how this drug affects you. Do not stand or sit up quickly, especially if you are an older patient. This reduces the risk of dizzy or fainting spells. Alcohol can make you more drowsy and dizzy. Avoid alcoholic drinks. What side effects may I notice from receiving this medicine? Side effects that you should report to your doctor or health care professional as soon as possible: -allergic reactions like skin rash, itching or hives, swelling of the face, lips, or tongue -abnormal production of milk in females -breast enlargement in both males and females -change in the way you walk -difficulty moving, speaking or swallowing -drooling, lip smacking, or rapid movements of the tongue -excessive sweating -fever -involuntary or uncontrollable movements of the eyes, head, arms and legs -irregular heartbeat or palpitations -muscle twitches and spasms -unusually weak or tired Side effects that usually do not require medical attention (report to your doctor or health care professional if they continue or are bothersome): -change in sex drive or performance -depressed mood -diarrhea -difficulty  sleeping -headache -menstrual changes -restless or nervous This list may not describe all possible side effects. Call your doctor for medical advice about side effects. You may report side effects to FDA at 1-800-FDA-1088. Where should I keep my medicine? Keep out of the reach of children. Store at room temperature between 20 and 25 degrees C (68 and 77 degrees F). Protect from light. Keep container tightly closed. Throw away any unused medicine after the expiration date. NOTE: This sheet is a summary. It may not cover all possible information. If you have questions about this medicine, talk to your doctor, pharmacist, or health care provider.  2015, [employer] Standard. (2011-10-09 13:04:38)  Prednisone tablets What is this medicine? PREDNISONE (PRED ni sone) is a corticosteroid. It is commonly used to treat inflammation of the skin, joints, lungs, and other organs. Common conditions treated include asthma, allergies, and arthritis. It is also used for other conditions, such as blood disorders and diseases of the adrenal glands. This medicine may be used for other purposes; ask your health care provider or pharmacist if you have questions. COMMON BRAND NAME(S): Deltasone, Predone, Sterapred, Sterapred DS What should I tell my health care provider before I take this medicine? They need to know if you have any of these conditions: -Cushing's syndrome -diabetes -glaucoma -heart disease -high blood pressure -infection (especially a virus infection such as chickenpox, cold sores, or herpes) -kidney disease -liver disease -mental illness -myasthenia gravis -osteoporosis -seizures -stomach or intestine problems -thyroid disease -an unusual or allergic reaction to lactose, prednisone, other medicines, foods, dyes, or preservatives -pregnant or trying to get pregnant -breast-feeding How should I use this medicine? Take this medicine by mouth with a glass of water. Follow the  directions on the prescription label. Take this medicine with food. If you are taking this medicine once a day, take it in the morning. Do not take more medicine than you are told to take. Do not suddenly stop taking your medicine because you may develop a severe reaction. Your doctor will tell you how much medicine to take. If your doctor wants you to stop the medicine, the dose may be slowly lowered over time to avoid any side effects. Talk to your pediatrician regarding the use of this medicine  in children. Special care may be needed. Overdosage: If you think you have taken too much of this medicine contact a poison control center or emergency room at once. NOTE: This medicine is only for you. Do not share this medicine with others. What if I miss a dose? If you miss a dose, take it as soon as you can. If it is almost time for your next dose, talk to your doctor or health care professional. You may need to miss a dose or take an extra dose. Do not take double or extra doses without advice. What may interact with this medicine? Do not take this medicine with any of the following medications: -metyrapone -mifepristone This medicine may also interact with the following medications: -aminoglutethimide -amphotericin B -aspirin and aspirin-like medicines -barbiturates -certain medicines for diabetes, like glipizide or glyburide -cholestyramine -cholinesterase inhibitors -cyclosporine -digoxin -diuretics -ephedrine -female hormones, like estrogens and birth control pills -isoniazid -ketoconazole -NSAIDS, medicines for pain and inflammation, like ibuprofen or naproxen -phenytoin -rifampin -toxoids -vaccines -warfarin This list may not describe all possible interactions. Give your health care provider a list of all the medicines, herbs, non-prescription drugs, or dietary supplements you use. Also tell them if you smoke, drink alcohol, or use illegal drugs. Some items may interact with your  medicine. What should I watch for while using this medicine? Visit your doctor or health care professional for regular checks on your progress. If you are taking this medicine over a prolonged period, carry an identification card with your name and address, the type and dose of your medicine, and your doctor's name and address. This medicine may increase your risk of getting an infection. Tell your doctor or health care professional if you are around anyone with measles or chickenpox, or if you develop sores or blisters that do not heal properly. If you are going to have surgery, tell your doctor or health care professional that you have taken this medicine within the last twelve months. Ask your doctor or health care professional about your diet. You may need to lower the amount of salt you eat. This medicine may affect blood sugar levels. If you have diabetes, check with your doctor or health care professional before you change your diet or the dose of your diabetic medicine. What side effects may I notice from receiving this medicine? Side effects that you should report to your doctor or health care professional as soon as possible: -allergic reactions like skin rash, itching or hives, swelling of the face, lips, or tongue -changes in emotions or moods -changes in vision -depressed mood -eye pain -fever or chills, cough, sore throat, pain or difficulty passing urine -increased thirst -swelling of ankles, feet Side effects that usually do not require medical attention (report to your doctor or health care professional if they continue or are bothersome): -confusion, excitement, restlessness -headache -nausea, vomiting -skin problems, acne, thin and shiny skin -trouble sleeping -weight gain This list may not describe all possible side effects. Call your doctor for medical advice about side effects. You may report side effects to FDA at 1-800-FDA-1088. Where should I keep my medicine? Keep  out of the reach of children. Store at room temperature between 15 and 30 degrees C (59 and 86 degrees F). Protect from light. Keep container tightly closed. Throw away any unused medicine after the expiration date. NOTE: This sheet is a summary. It may not cover all possible information. If you have questions about this medicine, talk to your doctor, pharmacist, or  health care provider.  2015, [employer]vier/[employer] Standard. (2011-01-25 10:57:14)

## 2014-04-06 NOTE — ED Provider Notes (Signed)
CSN: [id number]     Arrival date & time 04/06/14  0102 History   First MD Initiated Contact with Patient 04/06/14 0407     Chief Complaint  Patient presents with  . Shortness of Breath     (Consider location/radiation/quality/duration/timing/severity/associated sxs/prior Treatment) Patient is a 48 y.o. female presenting with shortness of breath. The history is provided by the patient.  Shortness of Breath She started having difficulty breathing, wheezing, and coughing at about 9 PM. There is also associated dizziness and nausea and vomiting. She relates that the dizziness is like things are moving but not spinning. She has been using albuterol which does give slight, temporary relief of dyspnea and wheezing. She denies fever or chills or sweats. She denies any sick contacts. Cough has been nonproductive. Nothing seems to make the symptoms better or worse.  Past Medical History  Diagnosis Date  . ASTHMA 04/02/2007  . 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 Breast lumpectomy Right 1994  . Salpingoophorectomy Right 2004    endometriosis   Family History  Problem Relation Age of Onset  . Bone cancer Sister 16    deceased  . Breast cancer Sister 34    lives in [place]  . Hypertension Brother   . Hypertension Brother    History  Substance Use Topics  . Smoking status: Never Smoker   . Smokeless tobacco: Never Used  . Alcohol Use: No   OB History   Grav Para Term Preterm Abortions TAB SAB Ect Mult Living   5 2   3 1 2         Review of Systems  Respiratory: Positive for shortness of breath.   All other systems reviewed and are negative.     Allergies  Review of patient's allergies indicates no known allergies.  Home Medications   Prior to Admission medications   Medication Sig Start Date End Date Taking? Authorizing Provider  albuterol (PROAIR HFA) 108 (90 BASE) MCG/ACT inhaler INHALE 2 PUFFS INTO THE LUNGS EVERY 4 HOURS  AS NEEDED FOR WHEEZING 10/23/13  Yes Marletta Lor, MD  montelukast (SINGULAIR) 10 MG tablet TAKE 1 TABLET BY MOUTH AT BEDTIME 03/09/14  Yes Marletta Lor, MD  PROAIR HFA 108 [phone] BASE) MCG/ACT inhaler INHALE 2 PUFFS INTO THE LUNGS EVERY 4 HOURS AS NEEDED FOR WHEEZING 12/17/13  Yes Marletta Lor, MD  ibuprofen (ADVIL,MOTRIN) 600 MG tablet Take 1 tablet (600 mg total) by mouth every 6 (six) hours as needed for pain. 09/16/12   Leota Jacobsen, MD  scopolamine (TRANSDERM-SCOP) 1 MG/3DAYS Place 1 patch (1.5 mg total) onto the skin every 3 (three) days. 01/21/14   Marin Olp, MD  Vitamin D, Ergocalciferol, (DRISDOL) 50000 UNITS CAPS capsule Take 1 capsule (50,000 Units total) by mouth every 7 (seven) days. 01/11/14   Lyman Speller, MD   BP 124/78  Pulse 80  Temp(Src) 98.5 F (36.9 C) (Oral)  Resp 18  Ht 5\' 6"  (1.676 m)  Wt 180 lb (81.647 kg)  BMI 29.07 kg/m2  SpO2 95% Physical Exam  Nursing note and vitals reviewed.  48 year old female, who appears uncomfortable, but his in no acute distress. Vital signs are normal, although she is actually cachectic at the time I evaluated her respiratory rate of 30. Oxygen saturation is 95%, which is normal. Head is normocephalic and atraumatic. PERRLA, EOMI. Oropharynx is clear. Neck is nontender and supple without adenopathy or JVD.  There are no carotid bruits. Back is nontender and there is no CVA tenderness. Lungs are clear without rales, wheezes, or rhonchi. There is a definite prolongation of exhalation phase. Chest is nontender. Heart has regular rate and rhythm without murmur. Abdomen is soft, flat, nontender without masses or hepatosplenomegaly and peristalsis is normoactive. Extremities have no cyanosis or edema, full range of motion is present. Skin is warm and dry without rash. Neurologic: Mental status is normal, cranial nerves are intact, there are no motor or sensory deficits. Dizziness is reproduced by head movement.  No nystagmus is seen.  ED Course  Procedures (including critical care time) Labs Review Results for orders placed during the hospital encounter of 04/06/14  CBC WITH DIFFERENTIAL      Result Value Ref Range   WBC 12.1 (*) 4.0 - 10.5 K/uL   RBC 4.71  3.87 - 5.11 MIL/uL   Hemoglobin 13.5  12.0 - 15.0 g/dL   HCT 40.4  36.0 - 46.0 %   MCV 85.8  78.0 - 100.0 fL   MCH 28.7  26.0 - 34.0 pg   MCHC 33.4  30.0 - 36.0 g/dL   RDW 12.3  11.5 - 15.5 %   Platelets 217  150 - 400 K/uL   Neutrophils Relative % 92 (*) 43 - 77 %   Neutro Abs 11.0 (*) 1.7 - 7.7 K/uL   Lymphocytes Relative 6 (*) 12 - 46 %   Lymphs Abs 0.8  0.7 - 4.0 K/uL   Monocytes Relative 2 (*) 3 - 12 %   Monocytes Absolute 0.3  0.1 - 1.0 K/uL   Eosinophils Relative 0  0 - 5 %   Eosinophils Absolute 0.0  0.0 - 0.7 K/uL   Basophils Relative 0  0 - 1 %   Basophils Absolute 0.0  0.0 - 0.1 K/uL  BASIC METABOLIC PANEL      Result Value Ref Range   Sodium 142  137 - 147 mEq/L   Potassium 4.2  3.7 - 5.3 mEq/L   Chloride 104  96 - 112 mEq/L   CO2 24  19 - 32 mEq/L   Glucose, Bld 158 (*) 70 - 99 mg/dL   BUN 9  6 - 23 mg/dL   Creatinine, Ser 0.70  0.50 - 1.10 mg/dL   Calcium 9.5  8.4 - 10.5 mg/dL   GFR calc non Af Amer >90  >90 mL/min   GFR calc Af Amer >90  >90 mL/min   Anion gap 14  5 - 15   Imaging Review Dg Chest 2 View  04/06/2014   CLINICAL DATA:  Shortness of breath.  Initial encounter  EXAM: CHEST  2 VIEW  COMPARISON:  09/16/2012  FINDINGS: Chronic interstitial coarsening. There is no edema, consolidation, effusion, or pneumothorax. Normal heart size and mediastinal contours. No acute bony findings.  IMPRESSION: No active cardiopulmonary disease.   Electronically Signed   By: Jorje Guild M.D.   On: 04/06/2014 03:57   MDM   Final diagnoses:  Peripheral vertigo, unspecified laterality  Bronchospasm    Dizziness appears to be peripheral vertigo and will be treated symptomatically with ondansetron and meclizine.  There is definite bronchospasm although she has not wheezing currently. Both of these may have been triggered by a viral infection. She is received and intravenous fluid bolus with no improvement. Screening labs are obtained. She'll be given albuterol with ipratropium by nebulizer and also methylprednisolone.  Breathing and dizziness have improved with above noted treatment but dizziness  is still worse with movement and she still having nausea. She'll be given a dose of lorazepam and metoclopramide.  She feels much better after lorazepam and Maxzide. She is discharged with prescriptions for prednisone, meclizine, lorazepam, and metoclopramide. Followup with PCP in 3 days for recheck.  Delora Fuel, MD 57/[id number]/38 [id number]

## 2014-04-06 NOTE — ED Notes (Signed)
Patient transported to X-ray 

## 2014-04-06 NOTE — ED Notes (Signed)
PT presents to ED with complaints of asthma attack that started around 9pm. Pt reports dizziness and headache, nausea vomiting and diarrhea .Marland Kitchen  Pt has been taking pro air every hour tonight.

## 2014-04-06 NOTE — ED Notes (Signed)
Pt returned from xray

## 2014-04-07 ENCOUNTER — Telehealth: Payer: Self-pay | Admitting: Internal Medicine

## 2014-04-07 NOTE — Telephone Encounter (Signed)
Patient Information:  Caller Name: Richardean Canal  Phone: [phone]  Patient: Kathleen Farley, Kathleen Farley  Gender: Female  DOB: [birth year]  Age: 48 Years  PCP: Bluford Kaufmann (Family Practice > 24yrs old)  Pregnant: No  Office Follow Up:  Does the office need to follow up with this patient?: No  Instructions For The Office: N/A   Symptoms  Reason For Call & Symptoms: Pt reports she was seen in the [facility] ED 04/06/14 and advised to f/u with PCP.  Reviewed Health History In EMR: N/A  Reviewed Medications In EMR: N/A  Reviewed Allergies In EMR: N/A  Reviewed Surgeries / Procedures: N/A  Date of Onset of Symptoms: 04/07/2014 OB / GYN:  LMP: Unknown  Guideline(s) Used:  No Protocol Available - Information Only  Disposition Per Guideline:   Home Care  Reason For Disposition Reached:   Information only question and nurse able to answer  Advice Given:  N/A  Patient Will Follow Care Advice:  YES  F/U appt scheduled for 04/12/14 0830, with Dr Burnice Logan.

## 2014-04-08 NOTE — Telephone Encounter (Signed)
No answer, no voicemail.

## 2014-04-08 NOTE — Telephone Encounter (Signed)
Patient Information:  Caller Name: Anaika  Phone: [phone]  Patient: Kathleen Farley, Kathleen Farley  Gender: Female  DOB: 07-07-1965  Age: 48 Years  PCP: Bluford Kaufmann (Family Practice > 40yrs old)  Pregnant: No  Office Follow Up:  Does the office need to follow up with this patient?: Yes  Instructions For The Office: Needs visit.  RN Note:  Patient calling regarding sore throat, body aches.  Seen in Ed on 04/05/14 for same symptoms with additional  vomiting and fever.  No fever or vomiting now, but is having difficulty breathing due to sore throat. No relief with Ibuprofen.   Symptoms  Reason For Call & Symptoms: c/o sore throat, fever, body aches  Reviewed Health History In EMR: Yes  Reviewed Medications In EMR: Yes  Reviewed Allergies In EMR: Yes  Reviewed Surgeries / Procedures: Yes  Date of Onset of Symptoms: 04/05/2014 OB / GYN:  LMP: Unknown  Guideline(s) Used:  Sore Throat  Disposition Per Guideline:   Go to Office Now  Reason For Disposition Reached:   Difficulty breathing (per caller) but not severe  Advice Given:  Call Back If:  You become worse.  Patient Will Follow Care Advice:  YES

## 2014-04-09 NOTE — Telephone Encounter (Signed)
Spoke to pt, told her tried to call back yesterday but no answer. Asked her how she was feeling and if still having trouble breathing? Pt said she is okay, little better but is taking Prednisone, some SOB but prefers to wait till Monday to see Dr. Raliegh Ip. Told pt she can come in today to see another provider. Pt denied appointment wants to wait. Told pt okay but if SOB increases need to contact office or got urgent care or ED. Pt verbalized understanding.

## 2014-04-12 ENCOUNTER — Ambulatory Visit (INDEPENDENT_AMBULATORY_CARE_PROVIDER_SITE_OTHER): Payer: 59 | Admitting: Internal Medicine

## 2014-04-12 ENCOUNTER — Encounter: Payer: Self-pay | Admitting: Internal Medicine

## 2014-04-12 VITALS — BP 112/70 | HR 82 | Temp 98.2°F | Resp 20 | Ht 66.0 in | Wt 182.0 lb

## 2014-04-12 DIAGNOSIS — J4520 Mild intermittent asthma, uncomplicated: Secondary | ICD-10-CM

## 2014-04-12 DIAGNOSIS — Z8669 Personal history of other diseases of the nervous system and sense organs: Secondary | ICD-10-CM

## 2014-04-12 MED ORDER — ALBUTEROL SULFATE HFA 108 (90 BASE) MCG/ACT IN AERS: INHALATION_SPRAY | RESPIRATORY_TRACT | Status: DC

## 2014-04-12 MED ORDER — MONTELUKAST SODIUM 10 MG PO TABS: ORAL_TABLET | ORAL | Status: DC

## 2014-04-12 NOTE — Progress Notes (Signed)
Subjective:    Patient ID: Kathleen Farley, female    DOB: 1965/08/20, 48 y.o.   MRN: [id number]  HPI  48 year old patient who is seen today following a recent ED visit 6 days ago.  She presented to the ED with migraines and mild asthma.  Associated symptoms included dizziness, nausea and vomiting.  Today, she has done well.  Asthma.  Symptoms are resolved.  She was discharged on prednisone, which he has not taken in the past few days.  No recent albuterol use.  Migraines have resolved.  Past Medical History  Diagnosis Date  . ASTHMA 04/02/2007  . 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 Marital Status: Single    Spouse Name: N/A    Number of Children: 2  . Years of Education: N/A   Occupational History  .  [employer]   Social History Main Topics  . Smoking status: Never Smoker   . Smokeless tobacco: Never Used  . Alcohol Use: No  . Drug Use: No  . Sexual Activity: Yes    Partners: Male    Birth Control/ Protection: IUD   Other Topics Concern  . Not on file   Social History Narrative  . No narrative on file    Past Surgical History  Procedure Laterality Date  . Breast lumpectomy Right 1994  . Salpingoophorectomy Right 2004    endometriosis    Family History  Problem Relation Age of Onset  . Bone cancer Sister 40    deceased  . Breast cancer Sister 42    lives in [place]  . Hypertension Brother   . Hypertension Brother     No Known Allergies  Current Outpatient Prescriptions on File Prior to Visit  Medication Sig Dispense Refill  . albuterol (PROAIR HFA) 108 (90 BASE) MCG/ACT inhaler INHALE 2 PUFFS INTO THE LUNGS EVERY 4 HOURS AS NEEDED FOR WHEEZING  3 each  0  . ibuprofen (ADVIL,MOTRIN) 600 MG tablet Take 1 tablet (600 mg total) by mouth every 6 (six) hours as needed for pain.  30 tablet  0  . montelukast (SINGULAIR) 10 MG tablet TAKE 1 TABLET BY MOUTH AT BEDTIME  30 tablet  3  . LORazepam (ATIVAN) 1 MG tablet Take 1 tablet (1 mg  total) by mouth 3 (three) times daily as needed for anxiety (or dizziness).  30 tablet  0  . meclizine (ANTIVERT) 25 MG tablet Take 1 tablet (25 mg total) by mouth 3 (three) times daily as needed for dizziness.  30 tablet  0  . metoCLOPramide (REGLAN) 10 MG tablet Take 1 tablet (10 mg total) by mouth every 6 (six) hours as needed.  30 tablet  0   No current facility-administered medications on file prior to visit.    BP 112/70  Pulse 82  Temp(Src) 98.2 F (36.8 C) (Oral)  Resp 20  Ht 5\' 6"  (1.676 m)  Wt 182 lb (82.555 kg)  BMI 29.39 kg/m2  SpO2 98%    Review of Systems  Constitutional: Negative.   HENT: Negative for congestion, dental problem, hearing loss, rhinorrhea, sinus pressure, sore throat and tinnitus.   Eyes: Negative for pain, discharge and visual disturbance.  Respiratory: Positive for wheezing. Negative for cough and shortness of breath.   Cardiovascular: Negative for chest pain, palpitations and leg swelling.  Gastrointestinal: Negative for nausea, vomiting, abdominal pain, diarrhea, constipation, blood in stool and abdominal distention.  Genitourinary: Negative for dysuria, urgency, frequency,  hematuria, flank pain, vaginal bleeding, vaginal discharge, difficulty urinating, vaginal pain and pelvic pain.  Musculoskeletal: Negative for arthralgias, gait problem and joint swelling.  Skin: Negative for rash.  Neurological: Positive for dizziness and headaches. Negative for syncope, speech difficulty, weakness and numbness.  Hematological: Negative for adenopathy.  Psychiatric/Behavioral: Negative for behavioral problems, dysphoric mood and agitation. The patient is not nervous/anxious.        Objective:   Physical Exam  Constitutional: She is oriented to person, place, and time. She appears well-developed and well-nourished.  HENT:  Head: Normocephalic.  Right Ear: External ear normal.  Left Ear: External ear normal.  Mouth/Throat: Oropharynx is clear and moist.    Eyes: Conjunctivae and EOM are normal. Pupils are equal, round, and reactive to light.  Neck: Normal range of motion. Neck supple. No thyromegaly present.  Cardiovascular: Normal rate, regular rhythm, normal heart sounds and intact distal pulses.   Pulmonary/Chest: Effort normal and breath sounds normal. No respiratory distress. She has no wheezes. She has no rales.  Abdominal: Soft. Bowel sounds are normal. She exhibits no mass. There is no tenderness.  Musculoskeletal: Normal range of motion.  Lymphadenopathy:    She has no cervical adenopathy.  Neurological: She is alert and oriented to person, place, and time.  Skin: Skin is warm and dry. No rash noted.  Psychiatric: She has a normal mood and affect. Her behavior is normal.          Assessment & Plan:   Exacerbation of asthma, stable Migraine headaches, resolved  No change in medical regimen Medicines updated  Recheck 6 months or as needed

## 2014-04-12 NOTE — Progress Notes (Signed)
Pre visit review using our clinic review tool, if applicable. No additional management support is needed unless otherwise documented below in the visit note. 

## 2014-04-26 ENCOUNTER — Encounter: Payer: Self-pay | Admitting: Internal Medicine

## 2014-07-22 ENCOUNTER — Other Ambulatory Visit (INDEPENDENT_AMBULATORY_CARE_PROVIDER_SITE_OTHER): Payer: 59

## 2014-07-22 DIAGNOSIS — E559 Vitamin D deficiency, unspecified: Secondary | ICD-10-CM

## 2014-07-22 DIAGNOSIS — R899 Unspecified abnormal finding in specimens from other organs, systems and tissues: Secondary | ICD-10-CM

## 2014-07-22 LAB — LIPID PANEL
CHOLESTEROL: 219 mg/dL — AB (ref 0–200)
HDL: 48 mg/dL (ref >39)
LDL Cholesterol: 151 mg/dL — ABNORMAL HIGH (ref 0–99)
TRIGLYCERIDES: 98 mg/dL (ref <150)
Total CHOL/HDL Ratio: 4.6 Ratio
VLDL: 20 mg/dL (ref 0–40)

## 2014-07-23 LAB — VITAMIN D 25 HYDROXY (VIT D DEFICIENCY, FRACTURES): Vit D, 25-Hydroxy: 15 ng/mL — ABNORMAL LOW (ref 30–100)

## 2014-07-30 ENCOUNTER — Telehealth: Payer: Self-pay

## 2014-07-30 MED ORDER — VITAMIN D (ERGOCALCIFEROL) 1.25 MG (50000 UNIT) PO CAPS: 50000.0000 [IU] | ORAL_CAPSULE | ORAL | Status: DC

## 2014-07-30 NOTE — Telephone Encounter (Signed)
-----   Message from Lyman Speller, MD sent at 07/28/2014  2:07 PM EST ----- Inform pt Vit d level is still low.  Needs to be on 50,000IU weekly and not stop.  Will need rx to pharmacy.  Fasting lipid panel is better than last visit.  However, it is still elevated.  Total 219, was 256.  LDL 151 was 167.  This needs to be watched and she does need to discuss with Dr. Burnice Logan at her next visit.

## 2014-07-30 NOTE — Telephone Encounter (Signed)
Lmtcb//kn 

## 2014-08-13 ENCOUNTER — Other Ambulatory Visit: Payer: Self-pay | Admitting: *Deleted

## 2014-08-13 ENCOUNTER — Telehealth: Payer: Self-pay | Admitting: Internal Medicine

## 2014-08-13 MED ORDER — VITAMIN D (ERGOCALCIFEROL) 1.25 MG (50000 UNIT) PO CAPS: 50000.0000 [IU] | ORAL_CAPSULE | ORAL | Status: DC

## 2014-08-13 MED ORDER — MONTELUKAST SODIUM 10 MG PO TABS
ORAL_TABLET | ORAL | Status: DC
Start: 2014-08-13 — End: 2014-11-23

## 2014-08-13 MED ORDER — ALBUTEROL SULFATE HFA 108 (90 BASE) MCG/ACT IN AERS: INHALATION_SPRAY | RESPIRATORY_TRACT | Status: DC

## 2014-08-13 NOTE — Telephone Encounter (Signed)
Rx sent to pharmacy   

## 2014-08-13 NOTE — Telephone Encounter (Signed)
[place], [place] [phone] is requesting re-fills on montelukast (SINGULAIR) 10 MG tablet and albuterol (PROAIR HFA) 108 (90 BASE) MCG/ACT inhaler

## 2014-08-13 NOTE — Telephone Encounter (Signed)
Faxed Medication refill request from [facility] for -(requesting 90 day supply) Vitamin D 50,000 iu's Last AEX:  01/08/14 with SM Next AEX: 02/01/15 with SM Last vitamin d level checked: 07/22/14 at 15 Refill authorized: #30/1 rfs, please advise.

## 2014-08-16 NOTE — Telephone Encounter (Signed)
Fax faxed back to [facility] stating that #30/1 rfs has been sent.

## 2014-08-17 ENCOUNTER — Other Ambulatory Visit: Payer: Self-pay | Admitting: Obstetrics and Gynecology

## 2014-08-17 DIAGNOSIS — E785 Hyperlipidemia, unspecified: Secondary | ICD-10-CM

## 2014-08-23 NOTE — Telephone Encounter (Signed)
Patient notified of all results and referral for nutritionist has been made.//kn

## 2014-09-13 ENCOUNTER — Encounter: Payer: 59 | Attending: Obstetrics and Gynecology | Admitting: Skilled Nursing Facility1

## 2014-09-13 ENCOUNTER — Encounter: Payer: Self-pay | Admitting: Skilled Nursing Facility1

## 2014-09-13 VITALS — Ht 66.0 in | Wt 189.0 lb

## 2014-09-13 DIAGNOSIS — E785 Hyperlipidemia, unspecified: Secondary | ICD-10-CM | POA: Diagnosis not present

## 2014-09-13 DIAGNOSIS — Z713 Dietary counseling and surveillance: Secondary | ICD-10-CM | POA: Diagnosis not present

## 2014-09-13 NOTE — Patient Instructions (Signed)
-  Try to slow down when eating and turn off the television when you are eating -Try to cut back on your coffee consumption -Try to get at least 30 minutes of physical activity every day (walking, yoga, etc.) -Take that 30 minutes at work every day -Try to make your bedroom more conducive to sleep -Be sure to drink water throughout the day

## 2014-09-13 NOTE — Progress Notes (Signed)
  Medical Nutrition Therapy:  Appt start time: 1100 end time:  1200.   Assessment:  Primary concerns today: referred for hyperlipidemia. Patient states High cholesterol is a concern because her brother died of heart attack one year ago. Patient states she wants to Make sure she is eating the right foods.Patient states In the last year she has gained 20 pounds since her brother died but her weight is usually 170 pounds her entire adult life. Patient states she Tried a low carb-500 calories a day diet.Patient states she gets More short naps for sleep instead of actual sleep. Patient states work is always with her; her phone is always with her; in teh appointment both of her phones range constantly.Patient states she knits right before bed and usually drinks over 16 ounces of coffee. Patient states she Eats at the kitchen table but the TV is on. Patient states she Got phenternine injections as well as a pill from the [facility] on [address] (physicaians supervised weight loss).  A year ago wanted to be 140-150 because her BMI "was too high" so she got to 154 pounds from the 500 calorie plus appetite Supressant.  Patient states she did this diet for a year and then when she stopped it her weight spiked. Patient states she works (goes into the office) Five days a week but is technically always available for work.  Preferred Learning Style:   No preference indicated   Learning Readiness:   Ready   MEDICATIONS: See List   DIETARY INTAKE:  Usual eating pattern includes 3 meals and 2 snacks per day.  Everyday foods include none stated.  Avoided foods include none stated.    24-hr recall:  B ( AM): oatmeal or cold cereal or bagel with coffee Snk ( AM): white chocolate L ( PM): salad with protein----sandwhich Snk ( PM): white chocolate D ( PM): starch, salad, meat with vegetables (big portions) Snk ( PM): coffee and dessert and fruit Beverages: at least 20 ounces of coffee, does not  drink water, diet coke  Usual physical activity: ADL's  Estimated energy needs: 1600 calories 180 g carbohydrates 120 g protein 44 g fat  Progress Towards Goal(s):  In progress.   Nutritional Diagnosis:  -3.3 Overweight/obesity As related to large meal portions, excess consumption of high calorie coffee drinks, and a lack of physical activity.  As evidenced by BMI 30.5 and patient recall.    Intervention:  Nutrition counseling for hyperlipidemia. Dietitian educated the patient on the cause of hyperlipidemia. Dietitian also educated the patient on the importance of 3 meals a day, 2-3 snacks, limiting carbohydrates, limiting her coffee intake, and physical activity.  Goals: -Try to slow down when eating and turn off the television when you are eating -Try to cut back on your coffee consumption -Try to get at least 30 minutes of physical activity every day (walking, yoga, etc.) -Take that 30 minutes at work every day -Try to make your bedroom more conducive to sleep -Be sure to drink water throughout the day   Teaching Method Utilized:  Visual Auditory  Handouts given during visit include:  15 gram carbohydrate snack sheet  Low sodium flavoring options  MyPlate  Barriers to learning/adherence to lifestyle change: nutrition preconceptions  Demonstrated degree of understanding via:  Teach Back   Monitoring/Evaluation:  Dietary intake, exercise, cholesterol/LDL numbers, and body weight prn.

## 2014-10-26 ENCOUNTER — Other Ambulatory Visit: Payer: Self-pay | Admitting: Internal Medicine

## 2014-11-23 ENCOUNTER — Ambulatory Visit (INDEPENDENT_AMBULATORY_CARE_PROVIDER_SITE_OTHER): Payer: 59 | Admitting: Internal Medicine

## 2014-11-23 ENCOUNTER — Encounter: Payer: Self-pay | Admitting: Internal Medicine

## 2014-11-23 VITALS — BP 110/80 | HR 81 | Temp 98.0°F | Resp 22 | Ht 66.0 in | Wt 186.0 lb

## 2014-11-23 DIAGNOSIS — J4520 Mild intermittent asthma, uncomplicated: Secondary | ICD-10-CM

## 2014-11-23 DIAGNOSIS — R05 Cough: Secondary | ICD-10-CM | POA: Diagnosis not present

## 2014-11-23 DIAGNOSIS — R062 Wheezing: Secondary | ICD-10-CM

## 2014-11-23 DIAGNOSIS — R059 Cough, unspecified: Secondary | ICD-10-CM

## 2014-11-23 MED ORDER — ALBUTEROL SULFATE HFA 108 (90 BASE) MCG/ACT IN AERS: INHALATION_SPRAY | RESPIRATORY_TRACT | Status: DC

## 2014-11-23 MED ORDER — METHYLPREDNISOLONE ACETATE 80 MG/ML IJ SUSP
80.0000 mg | Freq: Once | INTRAMUSCULAR | Status: AC
  Administered 2014-11-23: 80 mg via INTRAMUSCULAR

## 2014-11-23 NOTE — Patient Instructions (Signed)
Acute bronchitis symptoms for less than 10 days are generally not helped by antibiotics.  Take over-the-counter expectorants and cough medications such as  Mucinex DM.  Call if there is no improvement in 5 to 7 days or if  you develop worsening cough, fever, or new symptoms, such as shortness of breath or chest pain.  Use albuterol 1 or 2 inhalations every 4-6 hours as needed for wheezing

## 2014-11-23 NOTE — Progress Notes (Signed)
Pre visit review using our clinic review tool, if applicable. No additional management support is needed unless otherwise documented below in the visit note. 

## 2014-11-23 NOTE — Progress Notes (Signed)
Subjective:    Patient ID: Kathleen Farley, female    DOB: 10-Mar-1966, 49 y.o.   MRN: [id number]  HPI  49 year old patient who has a history of asthma.  This has improved over the years and she self discontinued maintenance medication some time ago.  Previously she had been on both Advair and Singulair. Over the past few days she has developed URI symptoms with low-grade fever, cough, and has had some increasing wheezing.  She has required some albuterol over the past few days. Past Medical History  Diagnosis Date  . ASTHMA 04/02/2007  . 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 Marital Status: Single    Spouse Name: N/A  . Number of Children: 2  . Years of Education: N/A   Occupational History  .  [employer]   Social History Main Topics  . Smoking status: Never Smoker   . Smokeless tobacco: Never Used  . Alcohol Use: No  . Drug Use: No  . Sexual Activity:    Partners: Male    Birth Control/ Protection: IUD   Other Topics Concern  . Not on file   Social History Narrative    Past Surgical History  Procedure Laterality Date  . Breast lumpectomy Right 1994  . Salpingoophorectomy Right 2004    endometriosis    Family History  Problem Relation Age of Onset  . Bone cancer Sister 65    deceased  . Breast cancer Sister 54    lives in [place]  . Hypertension Brother   . Hypertension Brother     No Known Allergies  Current Outpatient Prescriptions on File Prior to Visit  Medication Sig Dispense Refill  . ibuprofen (ADVIL,MOTRIN) 600 MG tablet Take 1 tablet (600 mg total) by mouth every 6 (six) hours as needed for pain. 30 tablet 0  . Vitamin D, Ergocalciferol, (DRISDOL) 50000 UNITS CAPS capsule Take 1 capsule (50,000 Units total) by mouth every 7 (seven) days. 30 capsule 1   No current facility-administered medications on file prior to visit.    BP 110/80 mmHg  Pulse 81  Temp(Src) 98 F (36.7 C) (Oral)  Resp 22  Ht 5\' 6"  (1.676 m)   Wt 186 lb (84.369 kg)  BMI 30.04 kg/m2  SpO2 95%     Review of Systems  Constitutional: Positive for fever and fatigue.  HENT: Positive for congestion. Negative for dental problem, hearing loss, rhinorrhea, sinus pressure, sore throat and tinnitus.   Eyes: Negative for pain, discharge and visual disturbance.  Respiratory: Positive for cough, shortness of breath and wheezing.   Cardiovascular: Negative for chest pain, palpitations and leg swelling.  Gastrointestinal: Negative for nausea, vomiting, abdominal pain, diarrhea, constipation, blood in stool and abdominal distention.  Genitourinary: Negative for dysuria, urgency, frequency, hematuria, flank pain, vaginal bleeding, vaginal discharge, difficulty urinating, vaginal pain and pelvic pain.  Musculoskeletal: Negative for joint swelling, arthralgias and gait problem.  Skin: Negative for rash.  Neurological: Negative for dizziness, syncope, speech difficulty, weakness, numbness and headaches.  Hematological: Negative for adenopathy.  Psychiatric/Behavioral: Negative for behavioral problems, dysphoric mood and agitation. The patient is not nervous/anxious.        Objective:   Physical Exam  Constitutional: She is oriented to person, place, and time. She appears well-developed and well-nourished.  HENT:  Head: Normocephalic.  Right Ear: External ear normal.  Left Ear: External ear normal.  Mouth/Throat: Oropharynx is clear and moist.  Eyes: Conjunctivae and EOM  are normal. Pupils are equal, round, and reactive to light.  Neck: Normal range of motion. Neck supple. No thyromegaly present.  Cardiovascular: Normal rate, regular rhythm, normal heart sounds and intact distal pulses.   Pulmonary/Chest: Effort normal and breath sounds normal. She has no wheezes.  Abdominal: Soft. Bowel sounds are normal. She exhibits no mass. There is no tenderness.  Musculoskeletal: Normal range of motion.  Lymphadenopathy:    She has no cervical  adenopathy.  Neurological: She is alert and oriented to person, place, and time.  Skin: Skin is warm and dry. No rash noted.  Psychiatric: She has a normal mood and affect. Her behavior is normal.          Assessment & Plan:  Viral URI with cough Mild asthma exacerbation.  Will treat with Depo-Medrol   Symptomatic treatment discussed We'll consider resuming maintenance medications.  Depending on her clinical response

## 2015-01-23 ENCOUNTER — Other Ambulatory Visit: Payer: Self-pay | Admitting: Internal Medicine

## 2015-02-01 ENCOUNTER — Ambulatory Visit (INDEPENDENT_AMBULATORY_CARE_PROVIDER_SITE_OTHER): Payer: 59 | Admitting: Obstetrics & Gynecology

## 2015-02-01 ENCOUNTER — Encounter: Payer: Self-pay | Admitting: Obstetrics & Gynecology

## 2015-02-01 VITALS — BP 102/70 | HR 72 | Resp 16 | Ht 65.5 in | Wt 182.0 lb

## 2015-02-01 DIAGNOSIS — R0683 Snoring: Secondary | ICD-10-CM

## 2015-02-01 DIAGNOSIS — Z0000 Encounter for general adult medical examination without abnormal findings: Secondary | ICD-10-CM

## 2015-02-01 DIAGNOSIS — N912 Amenorrhea, unspecified: Secondary | ICD-10-CM

## 2015-02-01 DIAGNOSIS — Z01419 Encounter for gynecological examination (general) (routine) without abnormal findings: Secondary | ICD-10-CM | POA: Diagnosis not present

## 2015-02-01 LAB — POCT URINALYSIS DIPSTICK
Bilirubin, UA: NEGATIVE
Glucose, UA: NEGATIVE
Ketones, UA: NEGATIVE
Nitrite, UA: NEGATIVE
Protein, UA: NEGATIVE
Urobilinogen, UA: NEGATIVE
pH, UA: 5

## 2015-02-01 MED ORDER — VITAMIN D (ERGOCALCIFEROL) 1.25 MG (50000 UNIT) PO CAPS: 50000.0000 [IU] | ORAL_CAPSULE | ORAL | Status: DC

## 2015-02-01 NOTE — Progress Notes (Signed)
49 y.o. [id number] SingleMediterraneanF here for annual exam.  Doing well.  Went to [place] for 10 days and also went for a week to [facility].  Not having any bleeding.    Pt having increased issues with snoring.  Would like referral.  Kids are telling her it has gotten worsen.  No significant weight changes over last few years.  No LMP recorded. Patient is not currently having periods (Reason: IUD).          Sexually active: Yes.    The current method of family planning is IUD.    Exercising: No.  The patient does not participate in regular exercise at present. Smoker:  no  Health Maintenance: Pap:  01/08/14 Neg, neg HR HPV 2013 with negative pap History of abnormal Pap:  no MMG:  12/24/14 BIRADS1:neg Colonoscopy:  Never BMD:   Never TDaP:  04/2006 Screening Labs: Today, Hb today: 14.3, Urine today: WBC=small, RBC=trace   reports that she has never smoked. She has never used smokeless tobacco. She reports that she does not drink alcohol or use illicit drugs.  Past Medical History  Diagnosis Date  . ASTHMA 04/02/2007  . 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 Breast lumpectomy Right 1994  . Salpingoophorectomy Right 2004    endometriosis    Current Outpatient Prescriptions  Medication Sig Dispense Refill  . albuterol (PROAIR HFA) 108 (90 BASE) MCG/ACT inhaler USE 2 INHALATIONS EVERY 4 HOURS AS NEEDED FOR WHEEZING 3 each 0  . ibuprofen (ADVIL,MOTRIN) 600 MG tablet Take 1 tablet (600 mg total) by mouth every 6 (six) hours as needed for pain. 30 tablet 0  . PROAIR HFA 108 (90 BASE) MCG/ACT inhaler USE 2 INHALATIONS EVERY 4 HOURS AS NEEDED FOR WHEEZING 25.5 g 2  . Vitamin D, Ergocalciferol, (DRISDOL) 50000 UNITS CAPS capsule Take 1 capsule (50,000 Units total) by mouth every 7 (seven) days. (Patient not taking: Reported on 02/01/2015) 30 capsule 1   No current facility-administered medications for this visit.    Family History  Problem  Relation Age of Onset  . Bone cancer Sister 44    deceased  . Breast cancer Sister 55    lives in [place]  . Hypertension Brother   . Hypertension Brother     ROS:  Pertinent items are noted in HPI.  Otherwise, a comprehensive ROS was negative.  Exam:   BP 102/70 mmHg  Pulse 72  Resp 16  Ht 5' 5.5" (1.664 m)  Wt 182 lb (82.555 kg)  BMI 29.82 kg/m2  Weight change: +1#   Height: 5' 5.5" (166.4 cm)  Ht Readings from Last 3 Encounters:  02/01/15 5' 5.5" (1.664 m)  11/23/14 5\' 6"  (1.676 m)  09/13/14 5\' 6"  (1.676 m)    General appearance: alert, cooperative and appears stated age Head: Normocephalic, without obvious abnormality, atraumatic Neck: no adenopathy, supple, symmetrical, trachea midline and thyroid normal to inspection and palpation Lungs: clear to auscultation bilaterally Breasts: Small scar on left side of left areola, no masses, no nipple discharge, no skin changes, no LAD Heart: regular rate and rhythm Abdomen: soft, non-tender; bowel sounds normal; no masses,  no organomegaly Extremities: extremities normal, atraumatic, no cyanosis or edema Skin: Skin color, texture, turgor normal. No rashes or lesions Lymph nodes: Cervical, supraclavicular, and axillary nodes normal. No abnormal inguinal nodes palpated Neurologic: Grossly normal   Pelvic: External genitalia:  no lesions  Urethra:  normal appearing urethra with no masses, tenderness or lesions              Bartholins and Skenes: normal                 Vagina: normal appearing vagina with normal color and discharge, no lesions              Cervix: no lesions, IUD string noted              Pap taken: No. Bimanual Exam:  Uterus:  normal size, contour, position, consistency, mobility, non-tender              Adnexa: normal adnexa and no mass, fullness, tenderness               Rectovaginal: Confirms               Anus:  normal sphincter tone, no lesions  Chaperone was present for exam.  A:   Well Woman with normal exam  Mirena IUD use. Second placed 4/12 H/O papilloma s/p resection on left breast  Anxiety  Family hx of breast cancer--sister in [place]  Mirena IUD use Low Vit D Increased snoring  P: Mammogram UTD. Pt doing 3D MMG. pap smear with neg HR HPV 5/13. Neg pap 2015.  No pap today. Labs Jan with PCP Rx for 50K weekly of Vit D.  #12/4RF.  Repeat Vit D next year. [place] today.  If elevated, will plan IUD removal at next AEX.  If not, repeat [place] in 2/17.  If low, will plan to remove and replace Mirena again. Referral to ENT for evaluation for snoring return annually or prn

## 2015-02-02 LAB — FOLLICLE STIMULATING HORMONE: FSH: 100.8 m[IU]/mL

## 2015-02-03 LAB — HEMOGLOBIN, FINGERSTICK: HEMOGLOBIN, FINGERSTICK: 14.3 g/dL (ref 12.0–16.0)

## 2015-03-07 ENCOUNTER — Ambulatory Visit (INDEPENDENT_AMBULATORY_CARE_PROVIDER_SITE_OTHER): Payer: 59 | Admitting: Internal Medicine

## 2015-03-07 ENCOUNTER — Telehealth: Payer: Self-pay | Admitting: Obstetrics & Gynecology

## 2015-03-07 ENCOUNTER — Encounter: Payer: Self-pay | Admitting: Internal Medicine

## 2015-03-07 VITALS — BP 120/90 | HR 70 | Temp 98.4°F | Ht 65.5 in | Wt 181.0 lb

## 2015-03-07 DIAGNOSIS — M549 Dorsalgia, unspecified: Secondary | ICD-10-CM

## 2015-03-07 MED ORDER — METHYLPREDNISOLONE ACETATE 80 MG/ML IJ SUSP
80.0000 mg | Freq: Once | INTRAMUSCULAR | Status: AC
  Administered 2015-03-07: 80 mg via INTRAMUSCULAR

## 2015-03-07 NOTE — Telephone Encounter (Signed)
Patient having back and abdomen pain.

## 2015-03-07 NOTE — Patient Instructions (Signed)

## 2015-03-07 NOTE — Progress Notes (Signed)
Pre visit review using our clinic review tool, if applicable. No additional management support is needed unless otherwise documented below in the visit note. 

## 2015-03-07 NOTE — Progress Notes (Signed)
Subjective:    Patient ID: Kathleen Farley, female    DOB: 12-05-1965, 49 y.o.   MRN: [id number]  HPI  49 year old patient who presents with an 8 day history of left lumbar pain.  There is some radiation to the left hip and to the lateral aspect of the left leg.  Pain is aggravated by prolonged sitting, but is not bothersome with walking or at night. She usually uses high heels She also complains of some pain and bunion involving the left foot area  Past Medical History  Diagnosis Date  . ASTHMA 04/02/2007  . 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 Marital Status: Single    Spouse Name: N/A  . Number of Children: 2  . Years of Education: N/A   Occupational History  .  [employer]   Social History Main Topics  . Smoking status: Never Smoker   . Smokeless tobacco: Never Used  . Alcohol Use: No  . Drug Use: No  . Sexual Activity:    Partners: Male    Birth Control/ Protection: IUD   Other Topics Concern  . Not on file   Social History Narrative    Past Surgical History  Procedure Laterality Date  . Breast lumpectomy Right 1994  . Salpingoophorectomy Right 2004    endometriosis    Family History  Problem Relation Age of Onset  . Bone cancer Sister 72    deceased  . Breast cancer Sister 49    lives in [place]  . Hypertension Brother   . Hypertension Brother     No Known Allergies  Current Outpatient Prescriptions on File Prior to Visit  Medication Sig Dispense Refill  . albuterol (PROAIR HFA) 108 (90 BASE) MCG/ACT inhaler USE 2 INHALATIONS EVERY 4 HOURS AS NEEDED FOR WHEEZING 3 each 0  . ibuprofen (ADVIL,MOTRIN) 600 MG tablet Take 1 tablet (600 mg total) by mouth every 6 (six) hours as needed for pain. 30 tablet 0  . PROAIR HFA 108 (90 BASE) MCG/ACT inhaler USE 2 INHALATIONS EVERY 4 HOURS AS NEEDED FOR WHEEZING 25.5 g 2  . Vitamin D, Ergocalciferol, (DRISDOL) 50000 UNITS CAPS capsule Take 1 capsule (50,000 Units total) by  mouth every 7 (seven) days. 12 capsule 4   No current facility-administered medications on file prior to visit.    BP 120/90 mmHg  Pulse 70  Temp(Src) 98.4 F (36.9 C) (Oral)  Ht 5' 5.5" (1.664 m)  Wt 181 lb (82.101 kg)  BMI 29.65 kg/m2  SpO2 98%     Review of Systems  Constitutional: Negative.   HENT: Negative for congestion, dental problem, hearing loss, rhinorrhea, sinus pressure, sore throat and tinnitus.   Eyes: Negative for pain, discharge and visual disturbance.  Respiratory: Negative for cough and shortness of breath.   Cardiovascular: Negative for chest pain, palpitations and leg swelling.  Gastrointestinal: Negative for nausea, vomiting, abdominal pain, diarrhea, constipation, blood in stool and abdominal distention.  Genitourinary: Negative for dysuria, urgency, frequency, hematuria, flank pain, vaginal bleeding, vaginal discharge, difficulty urinating, vaginal pain and pelvic pain.  Musculoskeletal: Positive for back pain. Negative for joint swelling, arthralgias and gait problem.  Skin: Negative for rash.  Neurological: Negative for dizziness, syncope, speech difficulty, weakness, numbness and headaches.  Hematological: Negative for adenopathy.  Psychiatric/Behavioral: Negative for behavioral problems, dysphoric mood and agitation. The patient is not nervous/anxious.        Objective:   Physical Exam  Musculoskeletal:  Flexor on the left hip as well as straight leg testing tends aggravate the left lumbar discomfort No radicular symptoms Reflexes brisk and equal No motor weakness          Assessment & Plan:   Back pain secondary to lumbosacral strain.  Will treat with Depo-Medrol.  Local measures discussed.  She was told to avoid wearing high heels Continue ibuprofen as needed

## 2015-03-07 NOTE — Telephone Encounter (Signed)
Spoke with patient. Patient states that Saturday she began to have left sided abdominal pain and lower back pain. Has been taking Ibuprofen and using a heating pad with no relief. Denies any bleeding, nausea, vomiting, urinary symptoms, fevers, or chills. States that she started the "Isogenix diet" two weeks ago and also "twisted" her back last week. Advised patient she will need to be seen with her PCP for further evaluation of symptoms and treatment. Patient is agreeable and verbalizes understanding. Patient will call to schedule appointment with Dr.Miller after seeing PCP if needed.  Routing to provider for final review. Patient agreeable to disposition. Will close encounter.

## 2015-05-10 ENCOUNTER — Other Ambulatory Visit: Payer: Self-pay | Admitting: Internal Medicine

## 2015-05-24 ENCOUNTER — Ambulatory Visit: Payer: 59

## 2015-07-07 ENCOUNTER — Ambulatory Visit (INDEPENDENT_AMBULATORY_CARE_PROVIDER_SITE_OTHER): Payer: 59 | Admitting: Family Medicine

## 2015-07-07 ENCOUNTER — Encounter: Payer: Self-pay | Admitting: Family Medicine

## 2015-07-07 VITALS — BP 110/70 | HR 68 | Temp 98.4°F | Wt 182.0 lb

## 2015-07-07 DIAGNOSIS — M436 Torticollis: Secondary | ICD-10-CM | POA: Diagnosis not present

## 2015-07-07 DIAGNOSIS — M4722 Other spondylosis with radiculopathy, cervical region: Secondary | ICD-10-CM

## 2015-07-07 DIAGNOSIS — M47812 Spondylosis without myelopathy or radiculopathy, cervical region: Secondary | ICD-10-CM | POA: Diagnosis not present

## 2015-07-07 MED ORDER — KETOROLAC TROMETHAMINE 60 MG/2ML IM SOLN
30.0000 mg | Freq: Once | INTRAMUSCULAR | Status: AC
  Administered 2015-07-07: 30 mg via INTRAMUSCULAR

## 2015-07-07 MED ORDER — CYCLOBENZAPRINE HCL 5 MG PO TABS: 5.0000 mg | ORAL_TABLET | Freq: Three times a day (TID) | ORAL | Status: DC | PRN

## 2015-07-07 MED ORDER — PREDNISONE 20 MG PO TABS: ORAL_TABLET | ORAL | Status: DC

## 2015-07-07 NOTE — Patient Instructions (Signed)
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 Takes about 24 hours to really kick in.   Flexeril as needed for muscle spasm (would try not to drive at least 8 hours after taking) Follow up in 7-10 days if not at least 50% better.   See [place] sooner if progressive numbness, weakness.   Cervical Radiculopathy Cervical radiculopathy happens when a nerve in the neck (cervical nerve) is pinched or bruised. This condition can develop because of an injury or as part of the normal aging process. Pressure on the cervical nerves can cause pain or numbness that runs from the neck all the way down into the arm and fingers. Usually, this condition gets better with rest. Treatment may be needed if the condition does not improve.  CAUSES This condition may be caused by:  Injury.  Slipped (herniated) disk.  Muscle tightness in the neck because of overuse.  Arthritis.  Breakdown or degeneration in the bones and joints of the spine (spondylosis) due to aging.  Bone spurs that may develop near the cervical nerves. SYMPTOMS Symptoms of this condition include:  Pain that runs from the neck to the arm and hand. The pain can be severe or irritating. It may be worse when the neck is moved.  Numbness or weakness in the affected arm and hand. DIAGNOSIS This condition may be diagnosed based on symptoms, medical history, and a physical exam. You may also have tests, including:  X-rays.  CT scan.  MRI.  Electromyogram (EMG).  Nerve conduction tests. TREATMENT In many cases, treatment is not needed for this condition. With rest, the condition usually gets better over time. If treatment is needed, options may include:  Wearing a soft neck collar for short periods of time.  Physical therapy to strengthen your neck muscles.  Medicines, such as NSAIDs, oral corticosteroids, or spinal injections.  Surgery. This may be needed if other  treatments do not help. Various types of surgery may be done depending on the cause of your problems. HOME CARE INSTRUCTIONS Managing Pain  Take over-the-counter and prescription medicines only as told by your health care provider.  If directed, apply ice to the affected area.  Put ice in a plastic bag.  Place a towel between your skin and the bag.  Leave the ice on for 20 minutes, 2-3 times per day.  If ice does not help, you can try using heat. Take a warm shower or warm bath, or use a heat pack as told by your health care provider.  Try a gentle neck and shoulder massage to help relieve symptoms. Activity  Rest as needed. Follow instructions from your health care provider about any restrictions on activities.  Do stretching and strengthening exercises as told by your health care provider or physical therapist. General Instructions  If you were given a soft collar, wear it as told by your health care provider.  Use a flat pillow when you sleep.  Keep all follow-up visits as told by your health care provider. This is important. SEEK MEDICAL CARE IF:  Your condition does not improve with treatment. SEEK IMMEDIATE MEDICAL CARE IF:  Your pain gets much worse and cannot be controlled with medicines.  You have weakness or numbness in your hand, arm, face, or leg.  You have a high fever.  You have a stiff, rigid neck.  You lose control of your bowels or your bladder (have incontinence).  You have trouble with walking, balance,  or speaking.   This information is not intended to replace advice given to you by your health care provider. Make sure you discuss any questions you have with your health care provider.   Document Released: 03/06/2001 Document Revised: 03/02/2015 Document Reviewed: 08/05/2014 Elsevier Interactive Patient Education [employer].

## 2015-07-07 NOTE — Progress Notes (Signed)
Garret Reddish, MD  Subjective:  Kathleen Farley is a 50 y.o. year old very pleasant female patient who presents for/with See problem oriented charting ROS- some fatigue, no fevers, chills, nausea, vomiting.   Past Medical History-  Patient Active Problem List   Diagnosis Date Noted  . Anxiety state, unspecified 07/24/2013  . Chest pain 01/22/2013  . History of migraine headaches 02/20/2012  . Asthma 04/02/2007    Medications- reviewed and updated Current Outpatient Prescriptions  Medication Sig Dispense Refill  . montelukast (SINGULAIR) 10 MG tablet TAKE 1 TABLET AT BEDTIME 90 tablet 3  . Vitamin D, Ergocalciferol, (DRISDOL) 50000 UNITS CAPS capsule Take 1 capsule (50,000 Units total) by mouth every 7 (seven) days. 12 capsule 4  . albuterol (PROAIR HFA) 108 (90 BASE) MCG/ACT inhaler USE 2 INHALATIONS EVERY 4 HOURS AS NEEDED FOR WHEEZING (Patient not taking: Reported on 07/07/2015) 3 each 0  . cyclobenzaprine (FLEXERIL) 5 MG tablet Take 1 tablet (5 mg total) by mouth 3 (three) times daily as needed for muscle spasms (in neck). 30 tablet 0  . ibuprofen (ADVIL,MOTRIN) 600 MG tablet Take 1 tablet (600 mg total) by mouth every 6 (six) hours as needed for pain. (Patient not taking: Reported on 07/07/2015) 30 tablet 0  . predniSONE (DELTASONE) 20 MG tablet Take 2 pills for 3 days, 1 pill for 4 days 10 tablet 0  . PROAIR HFA 108 (90 BASE) MCG/ACT inhaler USE 2 INHALATIONS EVERY 4 HOURS AS NEEDED FOR WHEEZING (Patient not taking: Reported on 07/07/2015) 25.5 g 2   No current facility-administered medications for this visit.    Objective: BP 110/70 mmHg  Pulse 68  Temp(Src) 98.4 F (36.9 C)  Wt 182 lb (82.555 kg) Gen: NAD, resting comfortably CV: RRR no murmurs rubs or gallops Lungs: CTAB no crackles, wheeze, rhonchi Ext: no pretibial edema, 2+ radial pulse Skin: warm, dry, no rash over arm or shoulder MSK: spurling positive with numbness and pain into right arm and neck. Patient tender  and with muscle spasm in base of right side of neck.  Neuro: 5/5 strength bilateral upper extremities, intact monofilament testing on right arm and shoulder.   Assessment/Plan:  R Cervical radiculopathy S:Started with stiffness/pain in right neck 3 days ago when she woke up. Later in the day noted some numbness into the right arm. Now left neck is starting to get stiff as well. No fall or injury. Up to 7/10 pain. Has had neck pain in 2008 but not as severe. CT scan at that time showed disk disease/degeneration at C5-C6. Does not want to use right arm as much.  Has not dropped anything. Feels some overall fatigue as well. Had an old muscle relaxer which made her sleepy and helped her sleep through the night.  A/P: R cervical radiculopathy with numbness/tingling into right upper arm as well as pain in stiffness in right arm/neck. She has some muscle spasm in neck as well. CT from 2008 shows some degenerative changes- suspect that along with likely uncomfortable sleep position may have triggered inflammation. Treat with prednisone for 7 days. We discussed PMR as possibility with fatigue and neck/shoulder stiffness but I strongly doubt this with focal exam findings of right neck tightness as well as positive Spurling test as well as symptoms R >>L. We did not opt for ESR testing. 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 Advised 7-10 day follow up if not at least 50-75% improved. Although reports some weakness-  none found on exam- if this symptom worsens or worsening pain- return sooner.   Going back to work today and does not have time to go by pharmacy- asked for help with pain control and toradol shot provided after reviewing last GFR >90.   Return precautions advised.    Meds ordered this encounter  Medications  . cyclobenzaprine (FLEXERIL) 5 MG tablet    Sig: Take 1 tablet (5 mg total) by mouth 3 (three) times daily as needed for muscle spasms (in neck).     Dispense:  30 tablet    Refill:  0  . predniSONE (DELTASONE) 20 MG tablet    Sig: Take 2 pills for 3 days, 1 pill for 4 days    Dispense:  10 tablet    Refill:  0  . ketorolac (TORADOL) injection 30 mg    Sig:

## 2015-08-17 ENCOUNTER — Telehealth: Payer: Self-pay | Admitting: Emergency Medicine

## 2015-08-17 NOTE — Telephone Encounter (Deleted)
-----   Message from Megan Salon, MD sent at 08/13/2015  7:34 AM EST ----- Regarding: IUD removal Pt's IUD needs to be replaced by 4/17.  She needs an [facility] first to see if it just need to be removed or removed and replaced.  Please call pt to schedule the lab.  Thanks.  Vinnie Level

## 2015-09-13 ENCOUNTER — Ambulatory Visit (INDEPENDENT_AMBULATORY_CARE_PROVIDER_SITE_OTHER): Payer: 59 | Admitting: Internal Medicine

## 2015-09-13 ENCOUNTER — Encounter: Payer: Self-pay | Admitting: Internal Medicine

## 2015-09-13 DIAGNOSIS — M542 Cervicalgia: Secondary | ICD-10-CM

## 2015-09-13 MED ORDER — ALPRAZOLAM 0.25 MG PO TABS: 0.2500 mg | ORAL_TABLET | Freq: Two times a day (BID) | ORAL | Status: DC | PRN

## 2015-09-13 MED ORDER — TRAMADOL HCL 50 MG PO TABS: 50.0000 mg | ORAL_TABLET | Freq: Four times a day (QID) | ORAL | Status: DC | PRN

## 2015-09-13 MED ORDER — METHYLPREDNISOLONE ACETATE 80 MG/ML IJ SUSP
80.0000 mg | Freq: Once | INTRAMUSCULAR | Status: AC
  Administered 2015-09-13: 80 mg via INTRAMUSCULAR

## 2015-09-13 NOTE — Addendum Note (Signed)
Addended by: Marian Sorrow on: 09/13/2015 03:18 PM   Modules accepted: Orders

## 2015-09-13 NOTE — Progress Notes (Signed)
Pre visit review using our clinic review tool, if applicable. No additional management support is needed unless otherwise documented below in the visit note. 

## 2015-09-13 NOTE — Progress Notes (Signed)
Subjective:    Patient ID: Kathleen Farley, female    DOB: May 18, 1966, 50 y.o.   MRN: [id number]  HPI   50 year old patient who has a history of cervical DJD noted on cervical CT scanning 2008. She was seen here in January for a possible cervical radiculopathy that improved in just a couple of days.  For the past 2 weeks she has had neck, upper back discomfort and also of arm discomfort the left greater than the right arm pain is aggravated by use of the left arm.  Head turning.  Does not seem to aggravate the arm discomfort  Past Medical History  Diagnosis Date  . ASTHMA 04/02/2007  . 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 Marital Status: Single    Spouse Name: N/A  . Number of Children: 2  . Years of Education: N/A   Occupational History  .  [employer]   Social History Main Topics  . Smoking status: Never Smoker   . Smokeless tobacco: Never Used  . Alcohol Use: No  . Drug Use: No  . Sexual Activity:    Partners: Male    Birth Control/ Protection: IUD   Other Topics Concern  . Not on file   Social History Narrative    Past Surgical History  Procedure Laterality Date  . Breast lumpectomy Right 1994  . Salpingoophorectomy Right 2004    endometriosis    Family History  Problem Relation Age of Onset  . Bone cancer Sister 22    deceased  . Breast cancer Sister 78    lives in [place]  . Hypertension Brother   . Hypertension Brother     No Known Allergies  Current Outpatient Prescriptions on File Prior to Visit  Medication Sig Dispense Refill  . albuterol (PROAIR HFA) 108 (90 BASE) MCG/ACT inhaler USE 2 INHALATIONS EVERY 4 HOURS AS NEEDED FOR WHEEZING 3 each 0  . Vitamin D, Ergocalciferol, (DRISDOL) 50000 UNITS CAPS capsule Take 1 capsule (50,000 Units total) by mouth every 7 (seven) days. 12 capsule 4   No current facility-administered medications on file prior to visit.    BP 120/80 mmHg  Pulse 78  Temp(Src) 98.3 F  (36.8 C) (Oral)  Resp 18  Ht 5' 5.5" (1.664 m)  Wt 185 lb (83.915 kg)  BMI 30.31 kg/m2  SpO2 98%     Review of Systems  Constitutional: Negative.   HENT: Negative for congestion, dental problem, hearing loss, rhinorrhea, sinus pressure, sore throat and tinnitus.   Eyes: Negative for pain, discharge and visual disturbance.  Respiratory: Negative for cough and shortness of breath.   Cardiovascular: Negative for chest pain, palpitations and leg swelling.  Gastrointestinal: Negative for nausea, vomiting, abdominal pain, diarrhea, constipation, blood in stool and abdominal distention.  Genitourinary: Negative for dysuria, urgency, frequency, hematuria, flank pain, vaginal bleeding, vaginal discharge, difficulty urinating, vaginal pain and pelvic pain.  Musculoskeletal: Positive for neck pain and neck stiffness. Negative for joint swelling, arthralgias and gait problem.  Skin: Negative for rash.  Neurological: Negative for dizziness, syncope, speech difficulty, weakness, numbness and headaches.  Hematological: Negative for adenopathy.  Psychiatric/Behavioral: Negative for behavioral problems, dysphoric mood and agitation. The patient is not nervous/anxious.        Objective:   Physical Exam  Constitutional: She appears well-developed and well-nourished. No distress.  Neck: Normal range of motion.  Full range of motion of the neck Head turning to the extreme  left did tend aggravate some upper arm and shoulder discomfort  Neurological:  Biceps and triceps reflexes brisk and equal Normal grip strength Arm flexion and extension normal          Assessment & Plan:   Neck upper back and left arm pain.  Cannot rule out radiculopathy, but seems more musculoligamentous.  Patient had a prompt response to steroids in January.  She has been under my situational stress with the poor health of her brother.  She is planning on leaving for [place] tomorrow.  Will treat with Depo-Medrol and when  necessary Xanax as a muscle relaxer and sleep aid.  Prescription for tramadol.  Also dispensed

## 2015-09-13 NOTE — Patient Instructions (Signed)
You  may move around, but avoid painful motions and activities.  Apply heat  to the sore area for 15 to 20 minutes 3 or 4 times daily for the next two to 3 days.  Call or return to clinic prn if these symptoms worsen or fail to improve as anticipated.

## 2015-10-13 NOTE — Telephone Encounter (Signed)
Encounter opened erroneously.   Closed encounter.   

## 2015-10-26 ENCOUNTER — Telehealth: Payer: Self-pay | Admitting: Obstetrics & Gynecology

## 2015-10-26 NOTE — Telephone Encounter (Signed)
Left message on voicemail to call and reschedule cancelled appointment. °

## 2015-12-05 ENCOUNTER — Ambulatory Visit (INDEPENDENT_AMBULATORY_CARE_PROVIDER_SITE_OTHER): Payer: 59 | Admitting: Internal Medicine

## 2015-12-05 ENCOUNTER — Encounter: Payer: Self-pay | Admitting: Internal Medicine

## 2015-12-05 VITALS — BP 120/80 | HR 61 | Temp 98.4°F | Ht 65.5 in | Wt 180.0 lb

## 2015-12-05 DIAGNOSIS — J4520 Mild intermittent asthma, uncomplicated: Secondary | ICD-10-CM

## 2015-12-05 DIAGNOSIS — M25561 Pain in right knee: Secondary | ICD-10-CM

## 2015-12-05 NOTE — Progress Notes (Signed)
Subjective:    Patient ID: Kathleen Farley, female    DOB: 20-Feb-1966, 50 y.o.   MRN: [id number]  HPI  50 year old patient who presents with a chief complaint of right lateral knee pain as well as tenderness involving the left forearm area.  The left lower arm has been present for about 6-8 weeks.  Pain in right knee began 2 days ago. She has been quite anxious and states that a sister died of "bone cancer"approximately 8 years ago  Past Medical History  Diagnosis Date  . ASTHMA 04/02/2007  . 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 Marital Status: Single    Spouse Name: N/A  . Number of Children: 2  . Years of Education: N/A   Occupational History  .  [employer]   Social History Main Topics  . Smoking status: Never Smoker   . Smokeless tobacco: Never Used  . Alcohol Use: No  . Drug Use: No  . Sexual Activity:    Partners: Male    Birth Control/ Protection: IUD   Other Topics Concern  . Not on file   Social History Narrative    Past Surgical History  Procedure Laterality Date  . Breast lumpectomy Right 1994  . Salpingoophorectomy Right 2004    endometriosis    Family History  Problem Relation Age of Onset  . Bone cancer Sister 46    deceased  . Breast cancer Sister 41    lives in [place]  . Hypertension Brother   . Hypertension Brother     No Known Allergies  Current Outpatient Prescriptions on File Prior to Visit  Medication Sig Dispense Refill  . albuterol (PROAIR HFA) 108 (90 BASE) MCG/ACT inhaler USE 2 INHALATIONS EVERY 4 HOURS AS NEEDED FOR WHEEZING 3 each 0  . ibuprofen (ADVIL,MOTRIN) 200 MG tablet Take 400 mg by mouth as needed.    . Vitamin D, Ergocalciferol, (DRISDOL) 50000 UNITS CAPS capsule Take 1 capsule (50,000 Units total) by mouth every 7 (seven) days. 12 capsule 4   No current facility-administered medications on file prior to visit.    BP 120/80 mmHg  Pulse 61  Temp(Src) 98.4 F (36.9 C) (Oral)   Ht 5' 5.5" (1.664 m)  Wt 180 lb (81.647 kg)  BMI 29.49 kg/m2  SpO2 95%     Review of Systems  Constitutional: Negative.   HENT: Negative for congestion, dental problem, hearing loss, rhinorrhea, sinus pressure, sore throat and tinnitus.   Eyes: Negative for pain, discharge and visual disturbance.  Respiratory: Negative for cough and shortness of breath.   Cardiovascular: Negative for chest pain, palpitations and leg swelling.  Gastrointestinal: Negative for nausea, vomiting, abdominal pain, diarrhea, constipation, blood in stool and abdominal distention.  Genitourinary: Negative for dysuria, urgency, frequency, hematuria, flank pain, vaginal bleeding, vaginal discharge, difficulty urinating, vaginal pain and pelvic pain.  Musculoskeletal: Positive for myalgias and arthralgias. Negative for joint swelling and gait problem.  Skin: Negative for rash.  Neurological: Negative for dizziness, syncope, speech difficulty, weakness, numbness and headaches.  Hematological: Negative for adenopathy.  Psychiatric/Behavioral: Negative for behavioral problems, dysphoric mood and agitation. The patient is not nervous/anxious.        Objective:   Physical Exam  Constitutional: She appears well-developed and well-nourished. No distress.  Anxious Normal blood pressure  Musculoskeletal:  No swelling or active inflammation involving the right knee except for some mild tenderness along the right inferior lateral knee region  There was some mild discomfort to palpation involving the left forearm area.  There is no elbow or wrist involvement          Assessment & Plan:   Right knee pain.  This appears to be a mild tendinitis of only 2 days duration.  Left forearm pain appears to be more musculoligamentous.  Patient reassured.  Will continue ibuprofen and attempt to minimize overuse type activities  Nyoka Cowden, MD

## 2015-12-05 NOTE — Progress Notes (Signed)
Pre visit review using our clinic review tool, if applicable. No additional management support is needed unless otherwise documented below in the visit note. 

## 2015-12-05 NOTE — Patient Instructions (Signed)
Call or return to clinic prn if these symptoms worsen or fail to improve as anticipated.

## 2016-02-03 ENCOUNTER — Ambulatory Visit (INDEPENDENT_AMBULATORY_CARE_PROVIDER_SITE_OTHER): Payer: 59 | Admitting: Obstetrics & Gynecology

## 2016-02-03 ENCOUNTER — Encounter: Payer: Self-pay | Admitting: Obstetrics & Gynecology

## 2016-02-03 VITALS — BP 106/70 | HR 76 | Resp 16 | Ht 65.75 in | Wt 184.0 lb

## 2016-02-03 DIAGNOSIS — Z1211 Encounter for screening for malignant neoplasm of colon: Secondary | ICD-10-CM | POA: Diagnosis not present

## 2016-02-03 DIAGNOSIS — Z8742 Personal history of other diseases of the female genital tract: Secondary | ICD-10-CM

## 2016-02-03 DIAGNOSIS — Z30432 Encounter for removal of intrauterine contraceptive device: Secondary | ICD-10-CM

## 2016-02-03 DIAGNOSIS — Z124 Encounter for screening for malignant neoplasm of cervix: Secondary | ICD-10-CM

## 2016-02-03 DIAGNOSIS — Z01419 Encounter for gynecological examination (general) (routine) without abnormal findings: Secondary | ICD-10-CM | POA: Diagnosis not present

## 2016-02-03 DIAGNOSIS — M542 Cervicalgia: Secondary | ICD-10-CM | POA: Diagnosis not present

## 2016-02-03 DIAGNOSIS — M79602 Pain in left arm: Secondary | ICD-10-CM | POA: Diagnosis not present

## 2016-02-03 DIAGNOSIS — Z0000 Encounter for general adult medical examination without abnormal findings: Secondary | ICD-10-CM | POA: Diagnosis not present

## 2016-02-03 DIAGNOSIS — Z23 Encounter for immunization: Secondary | ICD-10-CM

## 2016-02-03 LAB — COMPREHENSIVE METABOLIC PANEL
ALK PHOS: 78 U/L (ref 33–130)
ALT: 24 U/L (ref 6–29)
AST: 22 U/L (ref 10–35)
Albumin: 4.5 g/dL (ref 3.6–5.1)
BUN: 14 mg/dL (ref 7–25)
CHLORIDE: 101 mmol/L (ref 98–110)
CO2: 28 mmol/L (ref 20–31)
Calcium: 10.1 mg/dL (ref 8.6–10.4)
Creat: 0.94 mg/dL (ref 0.50–1.05)
Glucose, Bld: 55 mg/dL — ABNORMAL LOW (ref 65–99)
Potassium: 4.1 mmol/L (ref 3.5–5.3)
Sodium: 140 mmol/L (ref 135–146)
Total Bilirubin: 0.4 mg/dL (ref 0.2–1.2)
Total Protein: 7.1 g/dL (ref 6.1–8.1)

## 2016-02-03 LAB — POCT URINALYSIS DIPSTICK
Bilirubin, UA: NEGATIVE
Glucose, UA: NEGATIVE
KETONES UA: NEGATIVE
NITRITE UA: NEGATIVE
PH UA: 6
Protein, UA: NEGATIVE
Urobilinogen, UA: NEGATIVE

## 2016-02-03 LAB — LIPID PANEL
CHOL/HDL RATIO: 3.7 ratio (ref <=5.0)
CHOLESTEROL: 250 mg/dL — AB (ref 125–200)
HDL: 67 mg/dL (ref >=46)
LDL Cholesterol: 157 mg/dL — ABNORMAL HIGH (ref <130)
TRIGLYCERIDES: 132 mg/dL (ref <150)
VLDL: 26 mg/dL (ref <30)

## 2016-02-03 NOTE — Progress Notes (Addendum)
50 y.o. [id number] SingleMediterraneanF here for annual exam.  Pt reports she is having issues with left neck pain and left lower back pain.  Pt having some issues with extension of her neck.  She is having some left arm pain as well.  Has seen Dr. Cordelia Pen and she's had two steroid injections in her left arm with some improvement.  Has not had any xrays or imaging done on the arm.  Pt feels like her left arm feels like she's held something out with arm extended and then the arm is achy.  Arm feels this way all the time.  Feels grip strength is decreased as well.  Not exercising due to pain and also due to increased stressors that are work related.  Has not seen ortho but would like to have referral.    Pt separately wonders if her lower left back pain.  She has hx of endometriosis with right salpingectomy 2004.  Reports pain she had at that time was very similar.  Does have some anxiety about this and possibly ovarian problems.  No LMP recorded. Patient is not currently having periods (Reason: IUD).          Sexually active: Yes.    The current method of family planning is IUD inserted 09/2010.    Exercising:   Yes Smoker:  no  Health Maintenance: Pap:  01/08/14 Neg. 2013 neg. HR HPV:Neg  History of abnormal Pap:  no MMG:  12/24/14 BIRADS1:neg. Pt will schedule  Colonoscopy:  Never.  Pt declines.   BMD:   Never TDaP:  04/2006  Pneumonia vaccine(s):  No Zostavax:   No Hep C testing: No Screening Labs: Here, Hb today: 14.4, Urine today: RBC=Small, WBC=Trace   reports that she has never smoked. She has never used smokeless tobacco. She reports that she does not drink alcohol or use drugs.  Past Medical History:  Diagnosis Date  . ASTHMA 04/02/2007  . 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 BREAST LUMPECTOMY Right 1994  . SALPINGOOPHORECTOMY Right 2004   endometriosis    Current Outpatient Prescriptions  Medication Sig Dispense Refill  .  albuterol (PROAIR HFA) 108 (90 BASE) MCG/ACT inhaler USE 2 INHALATIONS EVERY 4 HOURS AS NEEDED FOR WHEEZING 3 each 0  . ibuprofen (ADVIL,MOTRIN) 200 MG tablet Take 400 mg by mouth as needed.     No current facility-administered medications for this visit.     Family History  Problem Relation Age of Onset  . Bone cancer Sister 99    deceased  . Breast cancer Sister 26    lives in [place]  . Hypertension Brother   . Hypertension Brother     ROS:  Pertinent items are noted in HPI.  Otherwise, a comprehensive ROS was negative.  Exam:   BP 106/70 (BP Location: Right Arm, Patient Position: Sitting, Cuff Size: Large)   Pulse 76   Resp 16   Ht 5' 5.75" (1.67 m)   Wt 184 lb (83.5 kg)   BMI 29.92 kg/m     Height: 5' 5.75" (167 cm)  Ht Readings from Last 3 Encounters:  02/03/16 5' 5.75" (1.67 m)  12/05/15 5' 5.5" (1.664 m)  09/13/15 5' 5.5" (1.664 m)    General appearance: alert, cooperative and appears stated age Head: Normocephalic, without obvious abnormality, atraumatic Neck: no adenopathy, supple, symmetrical, trachea midline and thyroid normal to inspection and palpation Lungs: clear to auscultation bilaterally Breasts: normal appearance, no  masses or tenderness Heart: regular rate and rhythm Abdomen: soft, non-tender; bowel sounds normal; no masses,  no organomegaly Extremities: extremities normal, atraumatic, no cyanosis or edema Skin: Skin color, texture, turgor normal. No rashes or lesions Lymph nodes: Cervical, supraclavicular, and axillary nodes normal. No abnormal inguinal nodes palpated Neurologic: Grossly normal   Pelvic: External genitalia:  no lesions              Urethra:  normal appearing urethra with no masses, tenderness or lesions              Bartholins and Skenes: normal                 Vagina: normal appearing vagina with normal color and discharge, no lesions              Cervix: no lesions, IUD string noted              Pap taken: Yes.    Bimanual Exam:  Uterus:  normal size, contour, position, consistency, mobility, non-tender              Adnexa: normal adnexa and no mass, fullness, tenderness               Rectovaginal: Confirms               Anus:  normal sphincter tone, no lesions  Procedure:  Speculum placed.  IUD string noted and grasped with ringed forceps.  IUD removed with one pull.  Chaperone was present for exam.  A:  Well Woman with normal exam  Mirena IUD use with placement 4/12 and removal today H/O papilloma s/p resection on left breast  Anxiety  Family hx of breast cancer--sister in [place]  Low Vit D.  Pt has stopped taking rx and does not desire to restart. Neck and left arm pain, s/p injection without improvement  P: Mammogram UTD. Pt doing 3D MMG. Pap and HR HPV obtained today Tdap updated today Lipids, CMP obtained today. Mirena IUD removed today. Referral made to ortho today, Dr. Lynann Bologna Return for PUS to assess ovaries with hx of endometriosis Declines colonoscopy.  IFOB given today. Return annually or prn  ~In addition to annual gyn exam, additional 10 minutes spent with patient discussing shoulder and arm issues, ortho referral.

## 2016-02-05 ENCOUNTER — Other Ambulatory Visit: Payer: Self-pay | Admitting: Internal Medicine

## 2016-02-06 LAB — HEMOGLOBIN, FINGERSTICK: Hemoglobin, fingerstick: 14.4 g/dL (ref 12.0–16.0)

## 2016-02-07 ENCOUNTER — Encounter: Payer: Self-pay | Admitting: Obstetrics & Gynecology

## 2016-02-07 LAB — IPS PAP TEST WITH HPV

## 2016-02-16 LAB — FECAL OCCULT BLOOD, IMMUNOCHEMICAL: IMMUNOLOGICAL FECAL OCCULT BLOOD TEST: NEGATIVE

## 2016-02-16 NOTE — Addendum Note (Signed)
Addended by: Remigio Eisenmenger on: 02/16/2016 09:49 AM   Modules accepted: Orders

## 2016-02-20 ENCOUNTER — Telehealth: Payer: Self-pay | Admitting: *Deleted

## 2016-02-20 NOTE — Telephone Encounter (Signed)
-----   Message from Megan Salon, MD sent at 02/17/2016  5:59 PM EDT ----- Inform pt stool test was negative for blood.  Thanks.

## 2016-02-20 NOTE — Telephone Encounter (Signed)
Call to patient to review results. Patient states she is unable to talk at this time and will call back at her convenience.

## 2016-02-23 ENCOUNTER — Encounter: Payer: Self-pay | Admitting: Obstetrics & Gynecology

## 2016-02-23 ENCOUNTER — Ambulatory Visit (INDEPENDENT_AMBULATORY_CARE_PROVIDER_SITE_OTHER): Payer: 59 | Admitting: Obstetrics & Gynecology

## 2016-02-23 ENCOUNTER — Ambulatory Visit (INDEPENDENT_AMBULATORY_CARE_PROVIDER_SITE_OTHER): Payer: 59

## 2016-02-23 VITALS — BP 104/62 | HR 68 | Resp 16 | Ht 65.75 in | Wt 181.0 lb

## 2016-02-23 DIAGNOSIS — Z8742 Personal history of other diseases of the female genital tract: Secondary | ICD-10-CM | POA: Diagnosis not present

## 2016-02-23 DIAGNOSIS — M545 Low back pain, unspecified: Secondary | ICD-10-CM

## 2016-02-23 NOTE — Progress Notes (Signed)
50 y.o. G61P3 Married [place] female here for a pelvic ultrasound due to left lower back pain.  Pt with hx of endometriosis and reported pain she was experiencing was the same as then she had her right ovary removed.  Pt and I discussed this was unlikely given PMP status but desired PUS.  She is seeing ortho at this time for shoulder issues.  Pt feels left back pain is likely musculoskeletal as she has modified some of her activities and this has really helped.  No LMP recorded. Patient is not currently having periods (Reason: Other).  Sexually active:  yes  Contraception: PMP  FINDINGS: UTERUS: 5.8 x 4.3 x 3.4cm without fibroids EMS: 1.10mm ADNEXA:   Left ovary 2.5 x 1.0 x 1.1cm   Right ovary surgically absent CUL DE SAC: no free fluid  Findings reviewed with pt.  She is reassured and grateful that she could come back for this.  All questions answered.  Assessment:  Left lower back pain, improved with activity modification H/O endometriosis and RSO  Plan: Return for new issues or for AEX which is already scheduled.

## 2016-02-23 NOTE — Telephone Encounter (Signed)
Patient notified of ifob result at office visit appt.

## 2016-03-07 ENCOUNTER — Encounter: Payer: Self-pay | Admitting: Obstetrics & Gynecology

## 2016-05-04 ENCOUNTER — Other Ambulatory Visit: Payer: Self-pay | Admitting: Internal Medicine

## 2016-05-08 ENCOUNTER — Ambulatory Visit: Payer: 59 | Admitting: Obstetrics & Gynecology

## 2016-05-14 ENCOUNTER — Ambulatory Visit: Payer: 59 | Admitting: Obstetrics & Gynecology

## 2016-12-17 ENCOUNTER — Ambulatory Visit: Payer: 59 | Admitting: Internal Medicine

## 2017-01-08 ENCOUNTER — Ambulatory Visit: Payer: 59 | Admitting: Internal Medicine

## 2017-01-14 ENCOUNTER — Ambulatory Visit: Payer: 59 | Admitting: Internal Medicine

## 2017-01-18 ENCOUNTER — Encounter: Payer: Self-pay | Admitting: Internal Medicine

## 2017-01-18 ENCOUNTER — Ambulatory Visit (INDEPENDENT_AMBULATORY_CARE_PROVIDER_SITE_OTHER): Payer: 59 | Admitting: Internal Medicine

## 2017-01-18 VITALS — BP 128/78 | HR 69 | Temp 98.2°F | Ht 65.75 in | Wt 173.0 lb

## 2017-01-18 DIAGNOSIS — J4520 Mild intermittent asthma, uncomplicated: Secondary | ICD-10-CM

## 2017-01-18 MED ORDER — ALBUTEROL SULFATE HFA 108 (90 BASE) MCG/ACT IN AERS: INHALATION_SPRAY | RESPIRATORY_TRACT | 0 refills | Status: DC

## 2017-01-18 NOTE — Progress Notes (Signed)
Subjective:    Patient ID: Kathleen Farley, female    DOB: 04/07/1966, 51 y.o.   MRN: [id number]  HPI 51 year old patient who is seen today in follow-up.  She has a history of mild intermittent asthma She does require rescue use approximate 2 times per week.  She feels that this is triggered by stress, more than other factors She is attempting to lose weight and has been frustrated with lack of weight loss over the past 2 months.  Earlier, she did drop 15 pounds.  She continues to exercise at least.  She continues to exercise 5 times per week, and adheres to a low calorie diet  Past Medical History:  Diagnosis Date  . ASTHMA 04/02/2007  . 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 Marital status: Married    Spouse name: N/A  . Number of children: 2  . Years of education: N/A   Occupational History  .  [employer]   Social History Main Topics  . Smoking status: Never Smoker  . Smokeless tobacco: Never Used  . Alcohol use No  . Drug use: No  . Sexual activity: Yes    Partners: Male    Birth control/ protection: IUD   Other Topics Concern  . Not on file   Social History Narrative  . No narrative on file    Past Surgical History:  Procedure Laterality Date  . BREAST LUMPECTOMY Right 1994  . breast papilloma excsision  07/2007  . SALPINGOOPHORECTOMY Right 2004   endometriosis    Family History  Problem Relation Age of Onset  . Bone cancer Sister 55       deceased  . Breast cancer Sister 41       lives in [place]  . Hypertension Brother   . Hypertension Brother     No Known Allergies  Current Outpatient Prescriptions on File Prior to Visit  Medication Sig Dispense Refill  . ibuprofen (ADVIL,MOTRIN) 200 MG tablet Take 400 mg by mouth as needed.     No current facility-administered medications on file prior to visit.     BP 128/78 (BP Location: Left Arm, Patient Position: Sitting, Cuff Size: Normal)   Pulse 69   Temp 98.2 F  (36.8 C) (Oral)   Ht 5' 5.75" (1.67 m)   Wt 173 lb (78.5 kg)   SpO2 96%   BMI 28.14 kg/m      Review of Systems  Constitutional: Negative.   HENT: Negative for congestion, dental problem, hearing loss, rhinorrhea, sinus pressure, sore throat and tinnitus.   Eyes: Negative for pain, discharge and visual disturbance.  Respiratory: Positive for shortness of breath and wheezing. Negative for cough.   Cardiovascular: Negative for chest pain, palpitations and leg swelling.  Gastrointestinal: Negative for abdominal distention, abdominal pain, blood in stool, constipation, diarrhea, nausea and vomiting.  Genitourinary: Negative for difficulty urinating, dysuria, flank pain, frequency, hematuria, pelvic pain, urgency, vaginal bleeding, vaginal discharge and vaginal pain.  Musculoskeletal: Negative for arthralgias, gait problem and joint swelling.  Skin: Negative for rash.  Neurological: Negative for dizziness, syncope, speech difficulty, weakness, numbness and headaches.  Hematological: Negative for adenopathy.  Psychiatric/Behavioral: Negative for agitation, behavioral problems and dysphoric mood. The patient is not nervous/anxious.        Objective:   Physical Exam  Constitutional: She is oriented to person, place, and time. She appears well-developed and well-nourished.  HENT:  Head: Normocephalic.  Right Ear:  External ear normal.  Left Ear: External ear normal.  Mouth/Throat: Oropharynx is clear and moist.  Eyes: Pupils are equal, round, and reactive to light. Conjunctivae and EOM are normal.  Neck: Normal range of motion. Neck supple. No thyromegaly present.  Cardiovascular: Normal rate, regular rhythm, normal heart sounds and intact distal pulses.   Pulmonary/Chest: Effort normal and breath sounds normal. No respiratory distress. She has no wheezes. She has no rales.  Abdominal: Soft. Bowel sounds are normal. She exhibits no mass. There is no tenderness.  Musculoskeletal: Normal  range of motion.  Lymphadenopathy:    She has no cervical adenopathy.  Neurological: She is alert and oriented to person, place, and time.  Skin: Skin is warm and dry. No rash noted.  Psychiatric: She has a normal mood and affect. Her behavior is normal.          Assessment & Plan:   Mild intermittent asthma.  Rescue albuterol refilled. The patient was treated with Singulair in the past, but she did not take this regularly and was of unclear benefit.  The patient was given samples of  Breo to try for 4 weeks.  She will consider maintenance medication.  If she feels this is of great use and minimizes albuterol use   Nyoka Cowden

## 2017-01-18 NOTE — Patient Instructions (Addendum)
It is important that you exercise regularly, at least 20 minutes 3 to 4 times per week.  If you develop chest pain or shortness of breath seek  medical attention.   Asthma Attack Prevention, Adult Although you may not be able to control the fact that you have asthma, you can take actions to prevent episodes of asthma (asthma attacks). These actions include:  Creating a written plan for managing and treating your asthma attacks (asthma action plan).  Monitoring your asthma.  Avoiding things that can irritate your airways or make your asthma symptoms worse (asthma triggers).  Taking your medicines as directed.  Acting quickly if you have signs or symptoms of an asthma attack.  What are some ways to prevent an asthma attack? Create a plan Work with your health care provider to create an asthma action plan. This plan should include:  A list of your asthma triggers and how to avoid them.  A list of symptoms that you experience during an asthma attack.  Information about when to take medicine and how much medicine to take.  Information to help you understand your peak flow measurements.  Contact information for your health care providers.  Daily actions that you can take to control asthma.  Monitor your asthma  To monitor your asthma:  Use your peak flow meter every morning and every evening for 2-3 weeks. Record the results in a journal. A drop in your peak flow numbers on one or more days may mean that you are starting to have an asthma attack, even if you are not having symptoms.  When you have asthma symptoms, write them down in a journal.  Avoid asthma triggers  Work with your health care provider to find out what your asthma triggers are. This can be done by:  Being tested for allergies.  Keeping a journal that notes when asthma attacks occur and what may have contributed to them.  Asking your health care provider whether other medical conditions make your asthma  worse.  Common asthma triggers include:  Dust.  Smoke. This includes campfire smoke and secondhand smoke from tobacco products.  Pet dander.  Trees, grasses or pollens.  Very cold, dry, or humid air.  Mold.  Foods that contain high amounts of sulfites.  Strong smells.  Engine exhaust and air pollution.  Aerosol sprays and fumes from household cleaners.  Household pests and their droppings, including dust mites and cockroaches.  Certain medicines, including NSAIDs.  Once you have determined your asthma triggers, take steps to avoid them. Depending on your triggers, you may be able to reduce the chance of an asthma attack by:  Keeping your home clean. Have someone dust and vacuum your home for you 1 or 2 times a week. If possible, have them use a high-efficiency particulate arrestance (HEPA) vacuum.  Washing your sheets weekly in hot water.  Using allergy-proof mattress covers and casings on your bed.  Keeping pets out of your home.  Taking care of mold and water problems in your home.  Avoiding areas where people smoke.  Avoiding using strong perfumes or odor sprays.  Avoid spending a lot of time outdoors when pollen counts are high and on very windy days.  Talking with your health care provider before stopping or starting any new medicines.  Medicines Take over-the-counter and prescription medicines only as told by your health care provider. Many asthma attacks can be prevented by carefully following your medicine schedule. Taking your medicines correctly is especially important when  you cannot avoid certain asthma triggers. Even if you are doing well, do not stop taking your medicine and do not take less medicine. Act quickly If an asthma attack happens, acting quickly can decrease how severe it is and how long it lasts. Take these actions:  Pay attention to your symptoms. If you are coughing, wheezing, or having difficulty breathing, do not wait to see if your  symptoms go away on their own. Follow your asthma action plan.  If you have followed your asthma action plan and your symptoms are not improving, call your health care provider or seek immediate medical care at the nearest hospital.  It is important to write down how often you need to use your fast-acting rescue inhaler. You can track how often you use an inhaler in your journal. If you are using your rescue inhaler more often, it may mean that your asthma is not under control. Adjusting your asthma treatment plan may help you to prevent future asthma attacks and help you to gain better control of your condition. How can I prevent an asthma attack when I exercise?  Exercise is a common asthma trigger. To prevent asthma attacks during exercise:  Follow advice from your health care provider about whether you should use your fast-acting inhaler before exercising. Many people with asthma experience exercise-induced bronchoconstriction (EIB). This condition often worsens during vigorous exercise in cold, humid, or dry environments. Usually, people with EIB can stay very active by using a fast-acting inhaler before exercising.  Avoid exercising outdoors in very cold or humid weather.  Avoid exercising outdoors when pollen counts are high.  Warm up and cool down when exercising.  Stop exercising right away if asthma symptoms start.  Consider taking part in exercises that are less likely to cause asthma symptoms such as:  Indoor swimming.  Biking.  Walking.  Hiking.  Playing football.  This information is not intended to replace advice given to you by your health care provider. Make sure you discuss any questions you have with your health care provider. Document Released: 05/30/2009 Document Revised: 02/10/2016 Document Reviewed: 11/26/2015 Elsevier Interactive Patient Education  2017 [employer].

## 2017-03-14 ENCOUNTER — Encounter: Payer: Self-pay | Admitting: Internal Medicine

## 2017-04-23 DIAGNOSIS — Z803 Family history of malignant neoplasm of breast: Secondary | ICD-10-CM | POA: Diagnosis not present

## 2017-04-23 DIAGNOSIS — Z1231 Encounter for screening mammogram for malignant neoplasm of breast: Secondary | ICD-10-CM | POA: Diagnosis not present

## 2017-04-23 LAB — HM MAMMOGRAPHY

## 2017-04-24 ENCOUNTER — Encounter: Payer: Self-pay | Admitting: Internal Medicine

## 2017-05-03 ENCOUNTER — Encounter: Payer: Self-pay | Admitting: Obstetrics & Gynecology

## 2017-05-03 ENCOUNTER — Other Ambulatory Visit: Payer: Self-pay

## 2017-05-03 ENCOUNTER — Ambulatory Visit (INDEPENDENT_AMBULATORY_CARE_PROVIDER_SITE_OTHER): Payer: 59 | Admitting: Obstetrics & Gynecology

## 2017-05-03 VITALS — BP 110/68 | HR 76 | Resp 14 | Ht 65.5 in | Wt 177.0 lb

## 2017-05-03 DIAGNOSIS — Z01419 Encounter for gynecological examination (general) (routine) without abnormal findings: Secondary | ICD-10-CM | POA: Diagnosis not present

## 2017-05-03 DIAGNOSIS — Z0000 Encounter for general adult medical examination without abnormal findings: Secondary | ICD-10-CM | POA: Diagnosis not present

## 2017-05-03 DIAGNOSIS — Z1211 Encounter for screening for malignant neoplasm of colon: Secondary | ICD-10-CM | POA: Diagnosis not present

## 2017-05-03 NOTE — Progress Notes (Signed)
51 y.o. [id number] Married Kathleen Farley here for annual exam.  Doing well.  Still busy with work/family.  Sister with hx of breast cancer doing well.  Was so close to getting to the [place] but then with changes with presidential policies, she cannot come now.  Still frustrates pt.    Denies vaginal bleeding.  Patient's last menstrual period was 06/25/2010 (approximate).          Sexually active: Yes.    The current method of family planning is post menopausal.    Exercising: Yes.    pure barr Smoker:  no  Health Maintenance: Pap:  02/03/16 Neg. HR HPV:neg   01/08/14 Neg  History of abnormal Pap:  no MMG:  04/23/17 BIRADS1:Neg. [facility] faxing report.  Colonoscopy:  Never.  Will do after the end of the year. BMD:   Never TDaP:  01/2016  Pneumonia vaccine(s):  Never Zostavax:   No Hep C testing: N/A Screening Labs: Here today    reports that  has never smoked. she has never used smokeless tobacco. She reports that she does not drink alcohol or use drugs.  Past Medical History:  Diagnosis Date  . ASTHMA 04/02/2007  . 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 BREAST LUMPECTOMY Right 1994  . breast papilloma excsision  07/2007  . SALPINGOOPHORECTOMY Right 2004   endometriosis    Current Outpatient Medications  Medication Sig Dispense Refill  . albuterol (PROAIR HFA) 108 (90 Base) MCG/ACT inhaler USE 2 INHALATIONS EVERY 4 HOURS AS NEEDED FOR WHEEZING 3 each 0  . ibuprofen (ADVIL,MOTRIN) 200 MG tablet Take 400 mg by mouth as needed.     No current facility-administered medications for this visit.     Family History  Problem Relation Age of Onset  . Bone cancer Sister 46       deceased  . Breast cancer Sister 47       lives in [place]  . Hypertension Brother   . Hypertension Brother     ROS:  Pertinent items are noted in HPI.  Otherwise, a comprehensive ROS was negative.  Exam:   BP 110/68 (BP Location: Right Arm, Patient Position:  Sitting, Cuff Size: Normal)   Pulse 76   Resp 14   Ht 5' 5.5" (1.664 m)   Wt 177 lb (80.3 kg)   LMP 06/25/2010 (Approximate)   BMI 29.01 kg/m     Height: 5' 5.5" (166.4 cm)  Ht Readings from Last 3 Encounters:  05/03/17 5' 5.5" (1.664 m)  01/18/17 5' 5.75" (1.67 m)  02/23/16 5' 5.75" (1.67 m)    General appearance: alert, cooperative and appears stated age Head: Normocephalic, without obvious abnormality, atraumatic Neck: no adenopathy, supple, symmetrical, trachea midline and thyroid normal to inspection and palpation Lungs: clear to auscultation bilaterally Breasts: normal appearance, no masses or tenderness Heart: regular rate and rhythm Abdomen: soft, non-tender; bowel sounds normal; no masses,  no organomegaly Extremities: extremities normal, atraumatic, no cyanosis or edema Skin: Skin color, texture, turgor normal. No rashes or lesions Lymph nodes: Cervical, supraclavicular, and axillary nodes normal. No abnormal inguinal nodes palpated Neurologic: Grossly normal   Pelvic: External genitalia:  no lesions              Urethra:  normal appearing urethra with no masses, tenderness or lesions              Bartholins and Skenes: normal  Vagina: normal appearing vagina with normal color and discharge, no lesions              Cervix: no lesions              Pap taken: Yes.   Bimanual Exam:  Uterus:  normal size, contour, position, consistency, mobility, non-tender              Adnexa: normal adnexa and no mass, fullness, tenderness               Rectovaginal: Confirms               Anus:  normal sphincter tone, no lesions  Chaperone was present for exam.  A:  Well Woman with normal exam PMP, no HRT H/o breast papilloma s/p resection  Family hx of breast cancer in sister in [place] Hx of Vit D deficiency  P:   Mammogram guidelines reviewed.  Doing 3D. pap smear not indicated today.  Pap and HR HPV neg 2017. CBC, CMP, Vit D , TSH, and lipids obtained  today Pt ok with referral to GI for screening colonoscopy  return annually or prn

## 2017-05-04 LAB — LIPID PANEL
Chol/HDL Ratio: 4.5 ratio — ABNORMAL HIGH (ref 0.0–4.4)
Cholesterol, Total: 251 mg/dL — ABNORMAL HIGH (ref 100–199)
HDL: 56 mg/dL (ref >39)
LDL Calculated: 154 mg/dL — ABNORMAL HIGH (ref 0–99)
Triglycerides: 205 mg/dL — ABNORMAL HIGH (ref 0–149)
VLDL Cholesterol Cal: 41 mg/dL — ABNORMAL HIGH (ref 5–40)

## 2017-05-04 LAB — COMPREHENSIVE METABOLIC PANEL
A/G RATIO: 1.6 (ref 1.2–2.2)
ALBUMIN: 4.4 g/dL (ref 3.5–5.5)
ALK PHOS: 89 IU/L (ref 39–117)
ALT: 18 IU/L (ref 0–32)
AST: 19 IU/L (ref 0–40)
BUN / CREAT RATIO: 17 (ref 9–23)
BUN: 16 mg/dL (ref 6–24)
CHLORIDE: 101 mmol/L (ref 96–106)
CO2: 25 mmol/L (ref 20–29)
Calcium: 10.1 mg/dL (ref 8.7–10.2)
Creatinine, Ser: 0.96 mg/dL (ref 0.57–1.00)
GFR calc non Af Amer: 68 mL/min/{1.73_m2} (ref >59)
GFR, EST AFRICAN AMERICAN: 79 mL/min/{1.73_m2} (ref >59)
Globulin, Total: 2.8 g/dL (ref 1.5–4.5)
Glucose: 80 mg/dL (ref 65–99)
POTASSIUM: 4.5 mmol/L (ref 3.5–5.2)
SODIUM: 142 mmol/L (ref 134–144)
TOTAL PROTEIN: 7.2 g/dL (ref 6.0–8.5)

## 2017-05-04 LAB — VITAMIN D 25 HYDROXY (VIT D DEFICIENCY, FRACTURES): Vit D, 25-Hydroxy: 16.1 ng/mL — ABNORMAL LOW (ref 30.0–100.0)

## 2017-05-04 LAB — CBC
HEMATOCRIT: 43.4 % (ref 34.0–46.6)
Hemoglobin: 14.3 g/dL (ref 11.1–15.9)
MCH: 28.3 pg (ref 26.6–33.0)
MCHC: 32.9 g/dL (ref 31.5–35.7)
MCV: 86 fL (ref 79–97)
PLATELETS: 283 10*3/uL (ref 150–379)
RBC: 5.06 x10E6/uL (ref 3.77–5.28)
RDW: 12.8 % (ref 12.3–15.4)
WBC: 6.8 10*3/uL (ref 3.4–10.8)

## 2017-05-04 LAB — TSH: TSH: 1.42 u[IU]/mL (ref 0.450–4.500)

## 2017-05-07 ENCOUNTER — Other Ambulatory Visit: Payer: Self-pay | Admitting: *Deleted

## 2017-05-07 DIAGNOSIS — E559 Vitamin D deficiency, unspecified: Secondary | ICD-10-CM

## 2017-05-07 DIAGNOSIS — E7800 Pure hypercholesterolemia, unspecified: Secondary | ICD-10-CM

## 2017-05-07 MED ORDER — VITAMIN D (ERGOCALCIFEROL) 1.25 MG (50000 UNIT) PO CAPS: 50000.0000 [IU] | ORAL_CAPSULE | ORAL | 0 refills | Status: DC

## 2017-05-09 ENCOUNTER — Telehealth: Payer: Self-pay | Admitting: Obstetrics & Gynecology

## 2017-05-09 NOTE — Telephone Encounter (Signed)
Advised patient healthcare form is ready for pick up. Must be signed by her before it can be faxed. Patient verbalizes understanding. Advised will leave at the front desk for pick up.  Routing to provider for final review. Patient agreeable to disposition. Will close encounter.

## 2017-05-09 NOTE — Telephone Encounter (Signed)
Patient checking on form that she brought in to be completed for her insurance company.

## 2017-06-04 ENCOUNTER — Other Ambulatory Visit: Payer: Self-pay | Admitting: Internal Medicine

## 2017-06-12 ENCOUNTER — Telehealth: Payer: Self-pay | Admitting: Internal Medicine

## 2017-06-12 MED ORDER — ALBUTEROL SULFATE HFA 108 (90 BASE) MCG/ACT IN AERS: INHALATION_SPRAY | RESPIRATORY_TRACT | 0 refills | Status: DC

## 2017-06-12 NOTE — Telephone Encounter (Signed)
Copied from [place] [id number]. Topic: Quick Communication - See Telephone Encounter >> Jun 12, 2017  9:03 AM Ether Griffins B wrote: CRM for notification. See Telephone encounter for:  Pt forgot inhaler pt needs script faxed to [facility] Fax number [phone] phone number [phone] 06/12/17.

## 2017-06-12 NOTE — Telephone Encounter (Signed)
I spoke with Dr. Raliegh Ip and he did approve the inhaler.

## 2017-06-12 NOTE — Telephone Encounter (Signed)
I tried to call in, however they requested a fax, which I did to below number and I spoke with pt.

## 2017-07-04 ENCOUNTER — Other Ambulatory Visit: Payer: Self-pay | Admitting: Internal Medicine

## 2017-07-12 DIAGNOSIS — J00 Acute nasopharyngitis [common cold]: Secondary | ICD-10-CM | POA: Diagnosis not present

## 2017-07-29 ENCOUNTER — Other Ambulatory Visit: Payer: Self-pay | Admitting: Obstetrics & Gynecology

## 2017-07-29 NOTE — Telephone Encounter (Signed)
Medication refill request: Vit D 50,000 U #12 Last AEX:  05-03-17 Next AEX: 07-18-18 Last MMG (if hormonal medication request): 07-24-16 [id number] Refill authorized: Please advise  Patient has lab appointment 08-06-17 to recheck Vit D

## 2017-08-02 NOTE — Telephone Encounter (Signed)
rx declined as pt has blood work scheduled for repeat Vit D next week.

## 2017-08-06 ENCOUNTER — Other Ambulatory Visit: Payer: 59

## 2017-08-06 ENCOUNTER — Other Ambulatory Visit: Payer: Self-pay | Admitting: Internal Medicine

## 2017-09-03 ENCOUNTER — Other Ambulatory Visit (INDEPENDENT_AMBULATORY_CARE_PROVIDER_SITE_OTHER): Payer: 59

## 2017-09-03 DIAGNOSIS — E559 Vitamin D deficiency, unspecified: Secondary | ICD-10-CM

## 2017-09-03 DIAGNOSIS — E7800 Pure hypercholesterolemia, unspecified: Secondary | ICD-10-CM

## 2017-09-04 LAB — VITAMIN D 25 HYDROXY (VIT D DEFICIENCY, FRACTURES): Vit D, 25-Hydroxy: 26.1 ng/mL — ABNORMAL LOW (ref 30.0–100.0)

## 2017-09-04 LAB — LIPID PANEL
CHOL/HDL RATIO: 4.5 ratio — AB (ref 0.0–4.4)
Cholesterol, Total: 257 mg/dL — ABNORMAL HIGH (ref 100–199)
HDL: 57 mg/dL (ref >39)
LDL Calculated: 168 mg/dL — ABNORMAL HIGH (ref 0–99)
Triglycerides: 159 mg/dL — ABNORMAL HIGH (ref 0–149)
VLDL Cholesterol Cal: 32 mg/dL (ref 5–40)

## 2017-09-06 ENCOUNTER — Ambulatory Visit (INDEPENDENT_AMBULATORY_CARE_PROVIDER_SITE_OTHER): Payer: 59 | Admitting: Family Medicine

## 2017-09-06 ENCOUNTER — Encounter: Payer: Self-pay | Admitting: Family Medicine

## 2017-09-06 VITALS — BP 120/82 | HR 64 | Temp 97.5°F | Wt 177.0 lb

## 2017-09-06 DIAGNOSIS — M542 Cervicalgia: Secondary | ICD-10-CM | POA: Diagnosis not present

## 2017-09-06 MED ORDER — MELOXICAM 7.5 MG PO TABS
7.5000 mg | ORAL_TABLET | Freq: Every day | ORAL | 0 refills | Status: DC
Start: 2017-09-06 — End: 2017-12-25

## 2017-09-06 MED ORDER — CYCLOBENZAPRINE HCL 5 MG PO TABS: 5.0000 mg | ORAL_TABLET | Freq: Three times a day (TID) | ORAL | 0 refills | Status: DC | PRN

## 2017-09-06 NOTE — Patient Instructions (Addendum)
Muscle Strain A muscle strain is an injury that occurs when a muscle is stretched beyond its normal length. Usually a small number of muscle fibers are torn when this happens. Muscle strain is rated in degrees. First-degree strains have the least amount of muscle fiber tearing and pain. Second-degree and third-degree strains have increasingly more tearing and pain. Usually, recovery from muscle strain takes 1-2 weeks. Complete healing takes 5-6 weeks. What are the causes? Muscle strain happens when a sudden, violent force placed on a muscle stretches it too far. This may occur with lifting, sports, or a fall. What increases the risk? Muscle strain is especially common in athletes. What are the signs or symptoms? At the site of the muscle strain, there may be:  Pain.  Bruising.  Swelling.  Difficulty using the muscle due to pain or lack of normal function.  How is this diagnosed? Your health care provider will perform a physical exam and ask about your medical history. How is this treated? Often, the best treatment for a muscle strain is resting, icing, and applying cold compresses to the injured area. Follow these instructions at home:  Use the PRICE method of treatment to promote muscle healing during the first 2-3 days after your injury. The PRICE method involves: ? Protecting the muscle from being injured again. ? Restricting your activity and resting the injured body part. ? Icing your injury. To do this, put ice in a plastic bag. Place a towel between your skin and the bag. Then, apply the ice and leave it on from 15-20 minutes each hour. After the third day, switch to moist heat packs. ? Apply compression to the injured area with a splint or elastic bandage. Be careful not to wrap it too tightly. This may interfere with blood circulation or increase swelling. ? Elevate the injured body part above the level of your heart as often as you can.  Only take over-the-counter or  prescription medicines for pain, discomfort, or fever as directed by your health care provider.  Warming up prior to exercise helps to prevent future muscle strains. Contact a health care provider if:  You have increasing pain or swelling in the injured area.  You have numbness, tingling, or a significant loss of strength in the injured area. This information is not intended to replace advice given to you by your health care provider. Make sure you discuss any questions you have with your health care provider. Document Released: 06/11/2005 Document Revised: 11/17/2015 Document Reviewed: 01/08/2013 Elsevier Interactive Patient Education  2017 [employer] Inc[employer]  

## 2017-09-06 NOTE — Progress Notes (Signed)
Subjective:    Patient ID: Kathleen Farley, female    DOB: [birth year], 52 y.o.   MRN: [id number]  No chief complaint on file.   HPI Patient was seen today for acute concern.  Patient endorses waking up this morning with stiff neck and right shoulder.  Patient states pain is 50/10.  She is taking ibuprofen 400 mg and a TENS machine for this with little relief.  Patient denies fever, chills, headache, nausea, vomiting.  Patient has had this happen to her in the past.  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  Past Medical History:  Diagnosis Date  . ASTHMA 04/02/2007  . 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  Wearing TENS unit. Psych: Denies depression, anxiety, hallucinations     Objective:    Blood pressure 120/82, pulse 64, temperature (!) 97.5 F (36.4 C), weight 177 lb (80.3 kg), last menstrual period 06/25/2010, SpO2 97 %.   Gen. Pleasant, well-nourished, in no distress, normal affect   HEENT: [place]/AT, face symmetric, no scleral icterus, PERRLA, nares patent without drainage Neck: No JVD, no thyromegaly, no carotid bruits Lungs: no accessory muscle use, CTAB, no wheezes or rales Cardiovascular: RRR, no peripheral edema Musculoskeletal: No deformities, no cyanosis or clubbing, normal tone.  Limited active ROM 2/2 pain.  Tenderness/tightness of R upper edge of Trapezius muscle and shoulder. No TTP of midline spine.  Unable to fully flex neck or extend  neck 2/2 pain. Neuro:  A&Ox3, CN II-XII intact, normal gait   Wt Readings from Last 3 Encounters:  09/06/17 177 lb (80.3 kg)  05/03/17 177 lb (80.3 kg)  01/18/17 173 lb (78.5 kg)    Lab Results  Component Value Date   WBC 6.8 05/03/2017   HGB 14.3 05/03/2017   HCT 43.4 05/03/2017   PLT 283 05/03/2017   GLUCOSE 80 05/03/2017   CHOL 257 (H) 09/03/2017   TRIG 159 (H) 09/03/2017   HDL 57 09/03/2017   LDLCALC 168 (H) 09/03/2017   ALT 18 05/03/2017   AST 19 05/03/2017   NA 142 05/03/2017   K 4.5 05/03/2017   CL 101 05/03/2017   CREATININE 0.96 05/03/2017   BUN 16 05/03/2017   CO2 25 05/03/2017   TSH 1.420 05/03/2017    Assessment/Plan:  Neck pain  -likely 2/2 MSK strain. Pt does have a h/o DJD. -discussed heat, massage, ok to continue using the TENS unit. -given handout - Plan: cyclobenzaprine (FLEXERIL) 5 MG tablet, meloxicam (MOBIC) 7.5 MG tablet -f/u prn -given RTC or ED precautions.  Grier Mitts, MD

## 2017-10-03 DIAGNOSIS — M9901 Segmental and somatic dysfunction of cervical region: Secondary | ICD-10-CM | POA: Diagnosis not present

## 2017-10-03 DIAGNOSIS — M5411 Radiculopathy, occipito-atlanto-axial region: Secondary | ICD-10-CM | POA: Diagnosis not present

## 2017-10-07 DIAGNOSIS — M5411 Radiculopathy, occipito-atlanto-axial region: Secondary | ICD-10-CM | POA: Diagnosis not present

## 2017-10-07 DIAGNOSIS — M9901 Segmental and somatic dysfunction of cervical region: Secondary | ICD-10-CM | POA: Diagnosis not present

## 2017-10-08 DIAGNOSIS — M9901 Segmental and somatic dysfunction of cervical region: Secondary | ICD-10-CM | POA: Diagnosis not present

## 2017-10-08 DIAGNOSIS — M5411 Radiculopathy, occipito-atlanto-axial region: Secondary | ICD-10-CM | POA: Diagnosis not present

## 2017-10-09 DIAGNOSIS — M5411 Radiculopathy, occipito-atlanto-axial region: Secondary | ICD-10-CM | POA: Diagnosis not present

## 2017-10-09 DIAGNOSIS — M9901 Segmental and somatic dysfunction of cervical region: Secondary | ICD-10-CM | POA: Diagnosis not present

## 2017-10-15 ENCOUNTER — Ambulatory Visit: Payer: 59 | Admitting: Internal Medicine

## 2017-10-24 ENCOUNTER — Other Ambulatory Visit: Payer: Self-pay | Admitting: Internal Medicine

## 2017-12-25 ENCOUNTER — Ambulatory Visit (INDEPENDENT_AMBULATORY_CARE_PROVIDER_SITE_OTHER): Payer: 59 | Admitting: Internal Medicine

## 2017-12-25 ENCOUNTER — Encounter: Payer: Self-pay | Admitting: Internal Medicine

## 2017-12-25 VITALS — BP 100/70 | HR 61 | Temp 98.5°F | Wt 181.0 lb

## 2017-12-25 DIAGNOSIS — J4520 Mild intermittent asthma, uncomplicated: Secondary | ICD-10-CM

## 2017-12-25 DIAGNOSIS — M542 Cervicalgia: Secondary | ICD-10-CM

## 2017-12-25 MED ORDER — ALBUTEROL SULFATE HFA 108 (90 BASE) MCG/ACT IN AERS: INHALATION_SPRAY | RESPIRATORY_TRACT | 4 refills | Status: DC

## 2017-12-25 MED ORDER — BUDESONIDE 90 MCG/ACT IN AEPB: 1.0000 | INHALATION_SPRAY | Freq: Two times a day (BID) | RESPIRATORY_TRACT | 3 refills | Status: DC

## 2017-12-25 NOTE — Progress Notes (Signed)
Subjective:    Patient ID: Kathleen Farley, female    DOB: 06-Sep-1965, 52 y.o.   MRN: [id number]  HPI 52 year old patient who has a history of mild intermittent asthma.  She has not been seen by me in about 1 year.  She states her asthma has been stable but on closer questioning she does have daily use of rescue albuterol. She continues to have neck pain although better. She has some discomfort in the right shoulder but no definite radicular symptoms.  Cervical CT scan a number of years ago did reveal some degenerative changes  Past Medical History:  Diagnosis Date  . ASTHMA 04/02/2007  . 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 Marital status: Married    Spouse name: Not on file  . Number of children: 2  . Years of education: Not on file  . Highest education level: Not on file  Occupational History    Employer: [employer]  Social Needs  . Financial resource strain: Not on file  . Food insecurity:    Worry: Not on file    Inability: Not on file  . Transportation needs:    Medical: Not on file    Non-medical: Not on file  Tobacco Use  . Smoking status: Never Smoker  . Smokeless tobacco: Never Used  Substance and Sexual Activity  . Alcohol use: No  . Drug use: No  . Sexual activity: Yes    Partners: Male    Birth control/protection: None  Lifestyle  . Physical activity:    Days per week: Not on file    Minutes per session: Not on file  . Stress: Not on file  Relationships  . Social connections:    Talks on phone: Not on file    Gets together: Not on file    Attends religious service: Not on file    Active member of club or organization: Not on file    Attends meetings of clubs or organizations: Not on file    Relationship status: Not on file  . Intimate partner violence:    Fear of current or ex partner: Not on file    Emotionally abused: Not on file    Physically abused: Not on file    Forced sexual activity: Not on file    Other Topics Concern  . Not on file  Social History Narrative  . Not on file    Past Surgical History:  Procedure Laterality Date  . BREAST LUMPECTOMY Right 1994  . breast papilloma excsision  07/2007  . SALPINGOOPHORECTOMY Right 2004   endometriosis    Family History  Problem Relation Age of Onset  . Bone cancer Sister 28       deceased  . Breast cancer Sister 50       lives in [place]  . Hypertension Brother   . Hypertension Brother   . Diabetes Son        on oral agents    No Known Allergies  Current Outpatient Medications on File Prior to Visit  Medication Sig Dispense Refill  . albuterol (PROVENTIL HFA;VENTOLIN HFA) 108 (90 Base) MCG/ACT inhaler USE 2 PUFFS EVERY 4 HOURS AS NEEDED FOR WHEEZING 1 Inhaler 0  . albuterol (PROVENTIL HFA;VENTOLIN HFA) 108 (90 Base) MCG/ACT inhaler USE 2 PUFFS EVERY 4 HOURS AS NEEDED FOR WHEEZING 1 Inhaler 0  . albuterol (PROVENTIL HFA;VENTOLIN HFA) 108 (90 Base) MCG/ACT inhaler USE 2 PUFFS EVERY  4 HOURS AS NEEDED FOR WHEEZING 8.5 Inhaler 0  . cyclobenzaprine (FLEXERIL) 5 MG tablet Take 1 tablet (5 mg total) by mouth 3 (three) times daily as needed for muscle spasms. 30 tablet 0  . ibuprofen (ADVIL,MOTRIN) 200 MG tablet Take 400 mg by mouth as needed.     No current facility-administered medications on file prior to visit.     BP 100/70 (BP Location: Right Arm)   LMP 06/25/2010 (Approximate)      Review of Systems  Constitutional: Negative.   HENT: Negative for congestion, dental problem, hearing loss, rhinorrhea, sinus pressure, sore throat and tinnitus.   Eyes: Negative for pain, discharge and visual disturbance.  Respiratory: Positive for wheezing. Negative for cough and shortness of breath.   Cardiovascular: Negative for chest pain, palpitations and leg swelling.  Gastrointestinal: Negative for abdominal distention, abdominal pain, blood in stool, constipation, diarrhea, nausea and vomiting.  Genitourinary: Negative for  difficulty urinating, dysuria, flank pain, frequency, hematuria, pelvic pain, urgency, vaginal bleeding, vaginal discharge and vaginal pain.  Musculoskeletal: Positive for neck pain and neck stiffness. Negative for arthralgias, gait problem and joint swelling.  Skin: Negative for rash.  Neurological: Negative for dizziness, syncope, speech difficulty, weakness, numbness and headaches.  Hematological: Negative for adenopathy.  Psychiatric/Behavioral: Negative for agitation, behavioral problems and dysphoric mood. The patient is not nervous/anxious.        Objective:   Physical Exam  Constitutional: She appears well-developed and well-nourished. No distress.  Neck: Normal range of motion.  Pulmonary/Chest: Effort normal and breath sounds normal.  Neurological:   Symmetrical grip strength Normal arm extension and flexion  Biceps and triceps reflexes brisk and symmetrical          Assessment & Plan:   Cervical pain.  We will set up for physical therapy Asthma.  Not well controlled albuterol refilled.  Will place on maintenance ICS. Follow-up 3 months   Marletta Lor

## 2017-12-25 NOTE — Patient Instructions (Signed)
Physical therapy as discussed  Return in 3 months for follow-up

## 2017-12-26 ENCOUNTER — Other Ambulatory Visit: Payer: Self-pay | Admitting: Internal Medicine

## 2018-01-01 ENCOUNTER — Telehealth: Payer: Self-pay | Admitting: Internal Medicine

## 2018-01-01 NOTE — Telephone Encounter (Unsigned)
Copied from [place] [phone]. Topic: Quick Communication - See Telephone Encounter >> Jan 01, 2018  4:44 PM Percell Belt A wrote: CRM for notification. See Telephone encounter for: 07/1 [place], [address] [phone] (Phone)   they called in stated that both inhaler needs to be sent to them.  When I looked [facility]  Can this please resent to them.

## 2018-01-02 ENCOUNTER — Other Ambulatory Visit: Payer: Self-pay

## 2018-01-02 MED ORDER — BUDESONIDE 90 MCG/ACT IN AEPB: 1.0000 | INHALATION_SPRAY | Freq: Two times a day (BID) | RESPIRATORY_TRACT | 3 refills | Status: DC

## 2018-01-02 MED ORDER — ALBUTEROL SULFATE HFA 108 (90 BASE) MCG/ACT IN AERS: INHALATION_SPRAY | RESPIRATORY_TRACT | 4 refills | Status: DC

## 2018-01-02 NOTE — Telephone Encounter (Signed)
Rx resent. Pt notified. 

## 2018-01-20 ENCOUNTER — Encounter: Payer: Self-pay | Admitting: Physical Therapy

## 2018-01-20 ENCOUNTER — Ambulatory Visit: Payer: 59 | Attending: Internal Medicine | Admitting: Physical Therapy

## 2018-01-20 ENCOUNTER — Other Ambulatory Visit: Payer: Self-pay

## 2018-01-20 DIAGNOSIS — R293 Abnormal posture: Secondary | ICD-10-CM | POA: Insufficient documentation

## 2018-01-20 DIAGNOSIS — M542 Cervicalgia: Secondary | ICD-10-CM | POA: Diagnosis not present

## 2018-01-20 DIAGNOSIS — M62838 Other muscle spasm: Secondary | ICD-10-CM | POA: Diagnosis not present
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
## 2018-01-29 ENCOUNTER — Ambulatory Visit: Payer: 59 | Attending: Internal Medicine | Admitting: Physical Therapy

## 2018-01-29 DIAGNOSIS — R293 Abnormal posture: Secondary | ICD-10-CM | POA: Insufficient documentation

## 2018-01-29 DIAGNOSIS — M542 Cervicalgia: Secondary | ICD-10-CM | POA: Insufficient documentation

## 2018-01-29 DIAGNOSIS — M62838 Other muscle spasm: Secondary | ICD-10-CM | POA: Insufficient documentation

## 2018-01-31 ENCOUNTER — Ambulatory Visit: Payer: 59 | Admitting: Physical Therapy

## 2018-01-31 ENCOUNTER — Telehealth: Payer: Self-pay | Admitting: Physical Therapy

## 2018-01-31 NOTE — Telephone Encounter (Signed)
Called pt for no show. She is currently out of town. Reports she will be here for next appt.   Park Liter, PTA

## 2018-02-03 ENCOUNTER — Ambulatory Visit: Payer: 59 | Admitting: Physical Therapy

## 2018-02-03 DIAGNOSIS — M542 Cervicalgia: Secondary | ICD-10-CM

## 2018-02-03 DIAGNOSIS — R293 Abnormal posture: Secondary | ICD-10-CM | POA: Diagnosis not present

## 2018-02-03 DIAGNOSIS — M62838 Other muscle spasm: Secondary | ICD-10-CM

## 2018-02-03 NOTE — Patient Instructions (Signed)
Access Code: [email or web address]  URL: [email or web address]  Date: 02/03/2018  Prepared by: Elly Modena   Exercises  Correct Seated Posture - 10 reps - 3 sets - 1x daily - 7x weekly  Supine Shoulder Horizontal Abduction with Resistance - 10 reps - 3 sets - 1x daily - 7x weekly  Supine Shoulder External Rotation with Resistance - 10 reps - 3 sets - 1x daily - 7x weekly  Sidelying Thoracic Rotation - 15 reps - 2x daily - 7x weekly    [name] [address] [place], [place] [address] Phone # [phone] Fax [phone]

## 2018-02-03 NOTE — Therapy (Signed)
[facility] [address], [place], [place], [address] Phone: [phone]   Fax:  [phone]  Physical Therapy Treatment  Patient Details  Name: Kathleen Farley MRN: [id number] Date of Birth: 03-02-1966 Referring Provider: Bluford Kaufmann, MD   Encounter Date: 02/03/2018  PT End of Session - 02/03/18 1638    Visit Number  2    Date for PT Re-Evaluation  03/24/18    Authorization Type  [employer]ited [employer]    Authorization Time Period  01/20/18 to 03/24/18    Authorization - Visit Number  2    Authorization - Number of Visits  10    PT Start Time  5638    PT Stop Time  1655    PT Time Calculation (min)  40 min    Activity Tolerance  No increased pain;Patient tolerated treatment well    Behavior During Therapy  [facility] for tasks assessed/performed       Past Medical History:  Diagnosis Date  . ASTHMA 04/02/2007  . 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 BREAST LUMPECTOMY Right 1994  . breast papilloma excsision  07/2007  . SALPINGOOPHORECTOMY Right 2004   endometriosis    There were no vitals filed for this visit.  Subjective Assessment - 02/03/18 1617    Subjective  Pt reports that she has been traveling alot and this has made sleeping difficult. She was able to sleep last night without the pain waking her up.     Patient Stated Goals  decrease pain     Currently in Pain?  Yes    Pain Score  5     Pain Location  Neck    Pain Orientation  Right    Pain Descriptors / Indicators  Aching;Tightness    Pain Type  Chronic pain    Pain Radiating Towards  none     Pain Onset  More than a month ago    Pain Frequency  Constant    Aggravating Factors   looking Rt and Lt; stress    Pain Relieving Factors  heat and ice help                  UBE L2 x2 min forward/backward     [facility] Adult PT Treatment/Exercise - 02/03/18 0001      Exercises   Exercises  Neck      Neck Exercises: Standing   Other Standing Exercises  rows with green TB 2x15 reps       Neck Exercises: Seated   Shoulder Rolls  Backwards;15 reps    Other Seated Exercise  thoracic flexion x10 reps      Neck Exercises: Supine   Cervical Isometrics  Right lateral flexion;Left lateral flexion;Right rotation;Left rotation;5 secs;10 reps    Shoulder ABduction  10 reps;Both    Shoulder Abduction Limitations  x2 sets, horizontal abduction with red TB     Other Supine Exercise  BUE ER with towel squeeze and red TB 2x10 reps       Neck Exercises: Sidelying   Other Sidelying Exercise  thoracic rotation Lt and Rt x15 reps       Manual Therapy   Manual Therapy  Soft tissue mobilization;Myofascial release    Soft tissue mobilization  STM thoracic paraspinals    Myofascial Release  trigger point release Lt levator scap, Lt and Rt upper trap; Rt rhomboid with cervical active flexion  PT Education - 02/03/18 1635    Education Details  technique with therex; updated HEP    Person(s) Educated  Patient    Methods  Explanation;Handout    Comprehension  Verbalized understanding;Returned demonstration       PT Short Term Goals - 02/03/18 1659      PT SHORT TERM GOAL #1   Title  Pt will demo consistency and independence with her initial HEP to improve posture awareness and decrease cervical pain.     Time  3    Period  Weeks    Status  Achieved      PT SHORT TERM GOAL #2   Title  Pt will demo proper set up and use of a lumbar roll for additional low back support throughout the day at work, which will improve her cervical alignment and decrease strain on the neck.    Time  3    Period  Weeks    Status  On-going        PT Long Term Goals - 01/20/18 1645      PT LONG TERM GOAL #1   Title  Pt will demo improved Rt shoulder strength to 5/5 MMT to reflect a decrease in pain with shoulder movement.     Time  8    Period  Weeks    Status  New    Target Date  03/24/18       PT LONG TERM GOAL #2   Title  Pt will demo improved cervical rotation active ROM to atleast 60 deg each direction and cervical extension active ROM to atleast 50 deg which will allow her to complete daily activity without limitation.     Time  8    Period  Weeks    Status  New      PT LONG TERM GOAL #3   Title  Pt will demo improved posture awareness, evident by her ability to maintain upright sitting posture throughout her session without the need for cuing from the therapist.     Time  8    Period  Weeks    Status  New      PT LONG TERM GOAL #4   Title  Pt will report atleast 75% improvement in her neck pain from the start of PT which will improve her quality of life at home and work.     Time  8    Period  Weeks    Status  New      PT LONG TERM GOAL #5   Title  Pt will report being able to sleep through the night without increase in neck pain atleast 5 days a week, which will improve her quality of sleep.     Time  8    Period  Weeks    Status  New            Plan - 02/03/18 1636    Clinical Impression Statement  Pt has been completing her HEP, but was unable to come to her appointment the past 2 weeks since her evaluation. Session focused on updating pt's HEP with exercises to improve upper thoracic strength with intermittent cues to improve posture and technique. Therapist completed trigger point release to the upper traps and surrounding musculature, which pt reports decreased her pain 50% end of session. Pt demonstrated understanding of HEP updates at this time. Will continue with current POC.    Rehab Potential  Good    PT Frequency  2x /  week    PT Duration  8 weeks    PT Treatment/Interventions  ADLs/Self Care Home Management;Moist Heat;Electrical Stimulation;Cryotherapy;Therapeutic exercise;Therapeutic activities;Neuromuscular re-education;Manual techniques;Patient/family education;Passive range of motion;Taping;Dry needling    PT Next Visit Plan  STM to decrease  muscle spasm as needed; thoracic mobility; scap strengthening progression    PT Home Exercise Plan  Access Code: [email or web address]     Consulted and Agree with Plan of Care  Patient       Patient will benefit from skilled therapeutic intervention in order to improve the following deficits and impairments:  Decreased activity tolerance, Decreased strength, Impaired flexibility, Postural dysfunction, Pain, Improper body mechanics, Decreased range of motion, Increased muscle spasms, Hypomobility  Visit Diagnosis: Other muscle spasm  Cervicalgia  Abnormal posture     Problem List Patient Active Problem List   Diagnosis Date Noted  . Anxiety state, unspecified 07/24/2013  . Chest pain 01/22/2013  . History of migraine headaches 02/20/2012  . Asthma 04/02/2007    5:00 PM,02/03/18 Sherol Dade PT, DPT [place] [facility] [address], [place], [place], [address] Phone: [phone]   Fax:  [phone]  Name: Kathleen Farley MRN: [id number] Date of Birth: 1966-05-31

## 2018-02-05 ENCOUNTER — Ambulatory Visit: Payer: 59 | Admitting: Physical Therapy

## 2018-02-10 ENCOUNTER — Encounter: Payer: Self-pay | Admitting: Physical Therapy

## 2018-02-10 ENCOUNTER — Ambulatory Visit: Payer: 59 | Admitting: Physical Therapy

## 2018-02-10 DIAGNOSIS — M542 Cervicalgia: Secondary | ICD-10-CM

## 2018-02-10 DIAGNOSIS — M62838 Other muscle spasm: Secondary | ICD-10-CM | POA: Diagnosis not present

## 2018-02-10 DIAGNOSIS — R293 Abnormal posture: Secondary | ICD-10-CM

## 2018-02-11 NOTE — Therapy (Addendum)
[facility] [address] [place], [place], [address] Phone: [phone]   Fax:  [phone]  Physical Therapy Treatment/Discharge  Patient Details  Name: Kathleen Farley MRN: [id number] Date of Birth: 1965-11-30 Referring Provider: Bluford Kaufmann, MD   Encounter Date: 02/10/2018  PT End of Session - 02/10/18 1654    Visit Number  3    Date for PT Re-Evaluation  03/24/18    Authorization Type  [employer]    Authorization Time Period  01/20/18 to 03/24/18    Authorization - Visit Number  3    Authorization - Number of Visits  10    PT Start Time  3300    PT Stop Time  7622    PT Time Calculation (min)  38 min    Activity Tolerance  No increased pain;Patient tolerated treatment well    Behavior During Therapy  [facility] for tasks assessed/performed       Past Medical History:  Diagnosis Date  . ASTHMA 04/02/2007  . 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 BREAST LUMPECTOMY Right 1994  . breast papilloma excsision  07/2007  . SALPINGOOPHORECTOMY Right 2004   endometriosis    There were no vitals filed for this visit.  Subjective Assessment - 02/10/18 1620    Subjective  Pt reports that she was sore after her last session, but was able to complete her exercises some.     Patient Stated Goals  decrease pain     Currently in Pain?  Yes    Pain Score  4     Pain Location  Neck    Pain Orientation  Right    Pain Descriptors / Indicators  Aching;Tightness    Pain Type  Chronic pain    Pain Radiating Towards  none     Pain Onset  More than a month ago    Pain Frequency  Constant    Aggravating Factors   looking Rt and Lt; stress; exercise    Pain Relieving Factors  heat and ice help                      UBE L2 x2 min forward/backward for shoulder endurance  [facility] Adult PT Treatment/Exercise - 02/10/18 0001      Exercises   Exercises  Neck;Other  Exercises    Other Exercises   plank serratus press x15 reps       Neck Exercises: Standing   Other Standing Exercises  rows with green TB 2x15 reps     Other Standing Exercises  BUE extension with red TB 2x15 reps; Lt and Rt shoulder ER with red TB x10 reps elbow by side and at 90 deg abduction (yellow TB for Lt at 90 eg)      Neck Exercises: Supine   Other Supine Exercise  B serratus press with green TB x15 reps, serratus press with horizontal abduction x15 reps       Neck Exercises: Sidelying   Other Sidelying Exercise  Lt and Rt shoulder abduction with 2# x15 reps each; Lt and Rt shoulder flexion with 2# x15 reps       Neck Exercises: Stretches   Other Neck Stretches  seated lat stretch over foam roll with end range thoracic extension x15 reps     Other Neck Stretches  cross arm stretch 3x20 sec  PT Education - 02/11/18 0745    Education Details  technique with therex    Person(s) Educated  Patient    Methods  Explanation;Verbal cues;Tactile cues    Comprehension  Returned demonstration;Verbalized understanding       PT Short Term Goals - 02/03/18 1659      PT SHORT TERM GOAL #1   Title  Pt will demo consistency and independence with her initial HEP to improve posture awareness and decrease cervical pain.     Time  3    Period  Weeks    Status  Achieved      PT SHORT TERM GOAL #2   Title  Pt will demo proper set up and use of a lumbar roll for additional low back support throughout the day at work, which will improve her cervical alignment and decrease strain on the neck.    Time  3    Period  Weeks    Status  On-going        PT Long Term Goals - 01/20/18 1645      PT LONG TERM GOAL #1   Title  Pt will demo improved Rt shoulder strength to 5/5 MMT to reflect a decrease in pain with shoulder movement.     Time  8    Period  Weeks    Status  New    Target Date  03/24/18      PT LONG TERM GOAL #2   Title  Pt will demo improved cervical rotation  active ROM to atleast 60 deg each direction and cervical extension active ROM to atleast 50 deg which will allow her to complete daily activity without limitation.     Time  8    Period  Weeks    Status  New      PT LONG TERM GOAL #3   Title  Pt will demo improved posture awareness, evident by her ability to maintain upright sitting posture throughout her session without the need for cuing from the therapist.     Time  8    Period  Weeks    Status  New      PT LONG TERM GOAL #4   Title  Pt will report atleast 75% improvement in her neck pain from the start of PT which will improve her quality of life at home and work.     Time  8    Period  Weeks    Status  New      PT LONG TERM GOAL #5   Title  Pt will report being able to sleep through the night without increase in neck pain atleast 5 days a week, which will improve her quality of sleep.     Time  8    Period  Weeks    Status  New            Plan - 02/11/18 0745    Clinical Impression Statement  Today's session focused on improving scapular strength with several new exercises introduced. Pt was able to complete exercises with noted muscle fatigue and did require cuing for improved posture during shoulder elevation. Pt was encouraged to monitor her mechanics and posture during her exercise classes throughout the week and she verbalized understanding of this.  Ended without increase in neck pain.     Rehab Potential  Good    PT Frequency  2x / week    PT Duration  8 weeks    PT Treatment/Interventions  ADLs/Self  Care Home Management;Moist Heat;Electrical Stimulation;Cryotherapy;Therapeutic exercise;Therapeutic activities;Neuromuscular re-education;Manual techniques;Patient/family education;Passive range of motion;Taping;Dry needling    PT Next Visit Plan  STM to decrease muscle spasm as needed; thoracic mobility; scap strengthening progression (add sidelying therex to HEP)     PT Home Exercise Plan  Access Code: [email or web address]      Consulted and Agree with Plan of Care  Patient       Patient will benefit from skilled therapeutic intervention in order to improve the following deficits and impairments:  Decreased activity tolerance, Decreased strength, Impaired flexibility, Postural dysfunction, Pain, Improper body mechanics, Decreased range of motion, Increased muscle spasms, Hypomobility  Visit Diagnosis: Other muscle spasm  Cervicalgia  Abnormal posture     Problem List Patient Active Problem List   Diagnosis Date Noted  . Anxiety state, unspecified 07/24/2013  . Chest pain 01/22/2013  . History of migraine headaches 02/20/2012  . Asthma 04/02/2007      7:51 AM,02/11/18 Sherol Dade PT, DPT [facility] [address], [place], [place], [address] Phone: [phone]   Fax:  [phone]  Name: MAIRYN LENAHAN MRN: [id number] Date of Birth: [birth year]  *addendum to resolve episode of care and d/c pt from PT  [place]  Visits from Start of Care: 3  Current functional level related to goals / functional outcomes: See above for more details    Remaining deficits: See above for more details    Education / Equipment: See above for more details   Plan: Patient agrees to discharge.  Patient goals were partially met. Patient is being discharged due to not returning since the last visit.  ?????     Pt has had several no shows/cancellations over the past several weeks. She is being discharged at this time due to poor compliance.   11:36 AM,03/06/18 [place], [place] at [place]

## 2018-02-12 ENCOUNTER — Encounter: Payer: 59 | Admitting: Physical Therapy

## 2018-02-12 ENCOUNTER — Telehealth: Payer: Self-pay | Admitting: Physical Therapy

## 2018-02-12 NOTE — Telephone Encounter (Signed)
No show. Called and left voicemail for pt to return call at her earliest convenience.  4:32 PM,02/12/18 Kathleen Farley, [place] at [place]

## 2018-02-17 ENCOUNTER — Encounter: Payer: 59 | Admitting: Physical Therapy

## 2018-02-19 ENCOUNTER — Encounter: Payer: 59 | Admitting: Physical Therapy

## 2018-03-03 ENCOUNTER — Ambulatory Visit: Payer: 59 | Attending: Internal Medicine | Admitting: Physical Therapy

## 2018-03-05 ENCOUNTER — Encounter: Payer: 59 | Admitting: Physical Therapy

## 2018-06-29 DIAGNOSIS — Z23 Encounter for immunization: Secondary | ICD-10-CM | POA: Diagnosis not present

## 2018-07-17 ENCOUNTER — Encounter: Payer: Self-pay | Admitting: Obstetrics & Gynecology

## 2018-07-17 DIAGNOSIS — Z803 Family history of malignant neoplasm of breast: Secondary | ICD-10-CM | POA: Diagnosis not present

## 2018-07-17 DIAGNOSIS — Z1231 Encounter for screening mammogram for malignant neoplasm of breast: Secondary | ICD-10-CM | POA: Diagnosis not present

## 2018-07-18 ENCOUNTER — Other Ambulatory Visit: Payer: Self-pay

## 2018-07-18 ENCOUNTER — Other Ambulatory Visit (HOSPITAL_COMMUNITY)
Admission: RE | Admit: 2018-07-18 | Discharge: 2018-07-18 | Disposition: A | Discharge status: Home / Self Care | Payer: 59 | Source: Ambulatory Visit | Attending: Obstetrics & Gynecology | Admitting: Obstetrics & Gynecology

## 2018-07-18 ENCOUNTER — Ambulatory Visit (INDEPENDENT_AMBULATORY_CARE_PROVIDER_SITE_OTHER): Payer: 59 | Admitting: Obstetrics & Gynecology

## 2018-07-18 ENCOUNTER — Encounter: Payer: Self-pay | Admitting: Obstetrics & Gynecology

## 2018-07-18 VITALS — BP 94/60 | HR 68 | Resp 16 | Ht 65.5 in | Wt 186.6 lb

## 2018-07-18 DIAGNOSIS — R21 Rash and other nonspecific skin eruption: Secondary | ICD-10-CM | POA: Diagnosis not present

## 2018-07-18 DIAGNOSIS — L292 Pruritus vulvae: Secondary | ICD-10-CM

## 2018-07-18 DIAGNOSIS — Z124 Encounter for screening for malignant neoplasm of cervix: Secondary | ICD-10-CM | POA: Insufficient documentation

## 2018-07-18 DIAGNOSIS — Z0000 Encounter for general adult medical examination without abnormal findings: Secondary | ICD-10-CM

## 2018-07-18 DIAGNOSIS — Z01419 Encounter for gynecological examination (general) (routine) without abnormal findings: Secondary | ICD-10-CM | POA: Diagnosis not present

## 2018-07-18 MED ORDER — CLOBETASOL PROPIONATE 0.05 % EX OINT: 1.0000 "application " | TOPICAL_OINTMENT | Freq: Two times a day (BID) | CUTANEOUS | 0 refills | Status: DC

## 2018-07-18 NOTE — Progress Notes (Signed)
53 y.o. [id number] Married [place] or Caucasian female here for annual exam.  Doing well.  She did have some neck pain and she did go to PT which really helped.  Having recurrent issues with skin itching that has been present for about a year.  She is taking OTC claritin or an equivalent about once a week.  Within another 5-6 days, she will start to have itching which is usually on the abdomen and vulvar regions.  Itching will intensify and turn into a rash but if she takes an antihistamine, this controls.   Reports son had issues this past year with anemia.  Had full GI evaluation including endoscopy, small bowel evaluation, and colonoscopy.  Saw a second GI who found three ulcerations at the hiatal hernia.  He had to have surgery but is much improved now.  Has received IV iron.    Patient's last menstrual period was 06/25/2010 (approximate).          Sexually active: Yes.    The current method of family planning is post menopausal status.    Exercising: Yes.    walk Smoker:  no  Health Maintenance: Pap:  02/03/16 neg. HR HPV:neg   01/08/14 Neg   History of abnormal Pap:  no MMG:  04/23/17 BIRADS1:neg. Had 1 yesterday 07/17/18.  Reports she's received a normal result yesterday Colonoscopy:  Never BMD:   Never TDaP:  2017  Pneumonia vaccine(s):  n/a Shingrix:   No Hep C testing: n/a Screening Labs: Here today - not fasting    reports that she has never smoked. She has never used smokeless tobacco. She reports current alcohol use. She reports that she does not use drugs.  Past Medical History:  Diagnosis Date  . ASTHMA 04/02/2007  . 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 BREAST LUMPECTOMY Right 1994  . breast papilloma excsision  07/2007  . SALPINGOOPHORECTOMY Right 2004   endometriosis    Current Outpatient Medications  Medication Sig Dispense Refill  . albuterol (PROVENTIL HFA;VENTOLIN HFA) 108 (90 Base) MCG/ACT inhaler USE 2 PUFFS  EVERY 4 HOURS AS NEEDED FOR WHEEZING 8.5 Inhaler 4  . cholecalciferol (VITAMIN D3) 25 MCG (1000 UT) tablet Take 1,000 Units by mouth daily.    . vitamin B-12 (CYANOCOBALAMIN) 100 MCG tablet Take 100 mcg by mouth daily.     No current facility-administered medications for this visit.     Family History  Problem Relation Age of Onset  . Bone cancer Sister 41       deceased  . Breast cancer Sister 14       lives in [place]  . Hypertension Brother   . Hypertension Brother   . Diabetes Son        on oral agents    Review of Systems  Endocrine: Positive for cold intolerance and heat intolerance.  Skin:       Itching   All other systems reviewed and are negative.   Exam:   BP 94/60 (BP Location: Right Arm, Patient Position: Sitting, Cuff Size: Large)   Pulse 68   Resp 16   Ht 5' 5.5" (1.664 m)   Wt 186 lb 9.6 oz (84.6 kg)   LMP 06/25/2010 (Approximate)   BMI 30.58 kg/m   Height: 5' 5.5" (166.4 cm)  Ht Readings from Last 3 Encounters:  07/18/18 5' 5.5" (1.664 m)  05/03/17 5' 5.5" (1.664 m)  01/18/17 5' 5.75" (1.67 m)  General appearance: alert, cooperative and appears stated age Head: Normocephalic, without obvious abnormality, atraumatic Neck: no adenopathy, supple, symmetrical, trachea midline and thyroid normal to inspection and palpation Lungs: clear to auscultation bilaterally Breasts: normal appearance, no masses or tenderness Heart: regular rate and rhythm Abdomen: soft, non-tender; bowel sounds normal; no masses,  no organomegaly Extremities: extremities normal, atraumatic, no cyanosis or edema Skin: Skin color, texture, turgor normal. No rashes or lesions Lymph nodes: Cervical, supraclavicular, and axillary nodes normal. No abnormal inguinal nodes palpated Neurologic: Grossly normal   Pelvic: External genitalia:  Vulvar skin thickening that is completely symmetric and involved the entire vulva c/w itching              Urethra:  normal appearing urethra  with no masses, tenderness or lesions              Bartholins and Skenes: normal                 Vagina: normal appearing vagina with normal color and discharge, no lesions              Cervix: no lesions              Pap taken: Yes.   Bimanual Exam:  Uterus:  normal size, contour, position, consistency, mobility, non-tender              Adnexa: normal adnexa and no mass, fullness, tenderness               Rectovaginal: Confirms               Anus:  normal sphincter tone, no lesions  Chaperone was present for exam.  A:  Well Woman with normal exam PMP, no HRT H/O breast papilloma s/p resection Family hx of breast cancer in sister who lives in [place] Low Vit D deficiency  Recurrent vulvar and abdominal skin itching with vulvar skin changes that are consistent with this finding today  P:   Mammogram guidelines reviewed.  Pt did it yesterday pap smear obtained today Affirm pending TSH, Vit D, Lipids, CMP, CBC obtained today Referral to allergist placed Cologuard ordered for pt Advised stopping fragranced and dyed skin products, toilet papers, detergents to see if this is contributing Topical clobetasol 0.05% ointment BID for up to 14 days given.  Then decrease to nightly until allergy appt is scheduled to help control itching. Return annually or prn

## 2018-07-19 LAB — LIPID PANEL
CHOLESTEROL TOTAL: 233 mg/dL — AB (ref 100–199)
Chol/HDL Ratio: 5.4 ratio — ABNORMAL HIGH (ref 0.0–4.4)
HDL: 43 mg/dL (ref >39)
LDL CALC: 127 mg/dL — AB (ref 0–99)
TRIGLYCERIDES: 314 mg/dL — AB (ref 0–149)
VLDL CHOLESTEROL CAL: 63 mg/dL — AB (ref 5–40)

## 2018-07-19 LAB — CBC
HEMOGLOBIN: 14.1 g/dL (ref 11.1–15.9)
Hematocrit: 42.4 % (ref 34.0–46.6)
MCH: 28.4 pg (ref 26.6–33.0)
MCHC: 33.3 g/dL (ref 31.5–35.7)
MCV: 85 fL (ref 79–97)
Platelets: 348 10*3/uL (ref 150–450)
RBC: 4.97 x10E6/uL (ref 3.77–5.28)
RDW: 12.4 % (ref 11.7–15.4)
WBC: 6.4 10*3/uL (ref 3.4–10.8)

## 2018-07-19 LAB — COMPREHENSIVE METABOLIC PANEL
ALBUMIN: 4.4 g/dL (ref 3.8–4.9)
ALK PHOS: 76 IU/L (ref 39–117)
ALT: 19 IU/L (ref 0–32)
AST: 21 IU/L (ref 0–40)
Albumin/Globulin Ratio: 2 (ref 1.2–2.2)
BUN / CREAT RATIO: 16 (ref 9–23)
BUN: 15 mg/dL (ref 6–24)
Bilirubin Total: 0.2 mg/dL (ref 0.0–1.2)
CO2: 24 mmol/L (ref 20–29)
CREATININE: 0.92 mg/dL (ref 0.57–1.00)
Calcium: 10 mg/dL (ref 8.7–10.2)
Chloride: 104 mmol/L (ref 96–106)
GFR calc Af Amer: 82 mL/min/{1.73_m2} (ref >59)
GFR calc non Af Amer: 71 mL/min/{1.73_m2} (ref >59)
GLUCOSE: 63 mg/dL — AB (ref 65–99)
Globulin, Total: 2.2 g/dL (ref 1.5–4.5)
Potassium: 4.8 mmol/L (ref 3.5–5.2)
Sodium: 143 mmol/L (ref 134–144)
Total Protein: 6.6 g/dL (ref 6.0–8.5)

## 2018-07-19 LAB — VAGINITIS/VAGINOSIS, DNA PROBE
Candida Species: NEGATIVE
Gardnerella vaginalis: NEGATIVE
Trichomonas vaginosis: NEGATIVE

## 2018-07-19 LAB — VITAMIN D 25 HYDROXY (VIT D DEFICIENCY, FRACTURES): Vit D, 25-Hydroxy: 14.6 ng/mL — ABNORMAL LOW (ref 30.0–100.0)

## 2018-07-19 LAB — TSH: TSH: 1.65 u[IU]/mL (ref 0.450–4.500)

## 2018-07-22 LAB — CYTOLOGY - PAP: Diagnosis: NEGATIVE

## 2018-07-24 ENCOUNTER — Telehealth: Payer: Self-pay | Admitting: *Deleted

## 2018-07-24 DIAGNOSIS — E559 Vitamin D deficiency, unspecified: Secondary | ICD-10-CM

## 2018-07-24 DIAGNOSIS — E782 Mixed hyperlipidemia: Secondary | ICD-10-CM

## 2018-07-24 MED ORDER — VITAMIN D (ERGOCALCIFEROL) 1.25 MG (50000 UNIT) PO CAPS: 50000.0000 [IU] | ORAL_CAPSULE | ORAL | 0 refills | Status: AC

## 2018-07-24 NOTE — Telephone Encounter (Signed)
LM for pt to call back.

## 2018-07-24 NOTE — Telephone Encounter (Signed)
-----   Message from Megan Salon, MD sent at 07/23/2018 12:05 PM EST ----- Please let pt know her CBC, and TSH were normal.  CMP fine too.  Her vaginitis testing was negative.  Pap was normal.  02 recall.    Vit D low at 14.  Needs 50K weekly for 12 weeks and then repeat Vit D.  Also, cholesterol was mildly elevated at 233 and LDLs 127 but TGs were 314.  If this was not fasting, should repeat fasting and can do with Vit D check.  If was fasting, needs to see PCP for treatment options.

## 2018-07-24 NOTE — Telephone Encounter (Signed)
Patient returning call.

## 2018-07-24 NOTE — Telephone Encounter (Signed)
Patient notified. Patient states she was not fasting that day.  Lab appt scheduled 10/27/18 Vit D rx sent to pharmacy. Orders placed.   Encounter closed.

## 2018-08-05 ENCOUNTER — Ambulatory Visit: Payer: 59 | Admitting: Allergy and Immunology

## 2018-08-10 DIAGNOSIS — Z1211 Encounter for screening for malignant neoplasm of colon: Secondary | ICD-10-CM | POA: Diagnosis not present

## 2018-08-10 DIAGNOSIS — Z1212 Encounter for screening for malignant neoplasm of rectum: Secondary | ICD-10-CM | POA: Diagnosis not present

## 2018-08-14 LAB — COLOGUARD: Cologuard: NEGATIVE

## 2018-08-28 ENCOUNTER — Other Ambulatory Visit: Payer: Self-pay | Admitting: *Deleted

## 2018-08-28 ENCOUNTER — Telehealth: Payer: Self-pay | Admitting: *Deleted

## 2018-08-28 DIAGNOSIS — Z1212 Encounter for screening for malignant neoplasm of rectum: Secondary | ICD-10-CM

## 2018-08-28 DIAGNOSIS — Z1211 Encounter for screening for malignant neoplasm of colon: Secondary | ICD-10-CM

## 2018-08-28 NOTE — Telephone Encounter (Signed)
Call to patient. Patient notified of negative cologuard results and verbalized understanding. Patient states she has recently been seen by Nutritionist and was told her estrogen levels were low. Patient requesting appointment with Dr. Sabra Heck to discuss. Patient scheduled for Monday 09-01-2018 at 1445. Patient agreeable to date and time of appointment.   Routing to provider and will close encounter.

## 2018-09-01 ENCOUNTER — Encounter: Payer: Self-pay | Admitting: Obstetrics & Gynecology

## 2018-09-01 ENCOUNTER — Ambulatory Visit (INDEPENDENT_AMBULATORY_CARE_PROVIDER_SITE_OTHER): Payer: 59 | Admitting: Obstetrics & Gynecology

## 2018-09-01 ENCOUNTER — Other Ambulatory Visit: Payer: Self-pay

## 2018-09-01 VITALS — BP 120/70 | Resp 16 | Ht 65.5 in | Wt 190.0 lb

## 2018-09-01 DIAGNOSIS — Z803 Family history of malignant neoplasm of breast: Secondary | ICD-10-CM

## 2018-09-01 DIAGNOSIS — N951 Menopausal and female climacteric states: Secondary | ICD-10-CM | POA: Diagnosis not present

## 2018-09-01 NOTE — Progress Notes (Signed)
GYNECOLOGY  VISIT  CC:   Weight gain and fatigued  HPI: 53 y.o. [id number] Married White or Caucasian female here for lab result consult.  She went to [facility] to discuss frustration with weight and hormonal testing was obtained.  Estradiol was low and progesterone was low so HRT was recommended.  She wants to discuss this.  Pt has family hx of breast cancer in her sister and she had a breast papilloma that has been excised.  I really do not think she is going to tolerate being on HRT as she will just be anxious about this.  She agrees.  I did review lab work.  She is considering following their weight loss recommendations and taking some vitamins/supplements.  I do not now what the recommendations will be for weight loss so encouraged her to just call once she knows if there are questions/concerns.  GYNECOLOGIC HISTORY: Patient's last menstrual period was 06/25/2010 (approximate). Contraception: PMP Menopausal hormone therapy: none  Patient Active Problem List   Diagnosis Date Noted  . Anxiety state, unspecified 07/24/2013  . Chest pain 01/22/2013  . History of migraine headaches 02/20/2012  . Asthma 04/02/2007    Past Medical History:  Diagnosis Date  . ASTHMA 04/02/2007  . 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 BREAST LUMPECTOMY Right 1994  . breast papilloma excsision  07/2007  . SALPINGOOPHORECTOMY Right 2004   endometriosis    MEDS:   Current Outpatient Medications on File Prior to Visit  Medication Sig Dispense Refill  . albuterol (PROVENTIL HFA;VENTOLIN HFA) 108 (90 Base) MCG/ACT inhaler USE 2 PUFFS EVERY 4 HOURS AS NEEDED FOR WHEEZING 8.5 Inhaler 4  . cholecalciferol (VITAMIN D3) 25 MCG (1000 UT) tablet Take 1,000 Units by mouth daily.    . clobetasol ointment (TEMOVATE) 7.40 % Apply 1 application topically 2 (two) times daily. Apply as directed twice daily x 2 weeks.  Then decrease to nightly. 60 g 0  . vitamin B-12  (CYANOCOBALAMIN) 100 MCG tablet Take 100 mcg by mouth daily.    . Vitamin D, Ergocalciferol, (DRISDOL) 1.25 MG (50000 UT) CAPS capsule Take 1 capsule (50,000 Units total) by mouth every 7 (seven) days for 12 doses. 12 capsule 0   No current facility-administered medications on file prior to visit.     ALLERGIES: Patient has no known allergies.  Family History  Problem Relation Age of Onset  . Bone cancer Sister 71       deceased  . Breast cancer Sister 15       lives in [place]  . Hypertension Brother   . Hypertension Brother   . Diabetes Son        on oral agents    SH:  Married, non smoker  Review of Systems  All other systems reviewed and are negative.   PHYSICAL EXAMINATION:    BP 120/70 (BP Location: Right Arm, Patient Position: Sitting, Cuff Size: Normal)   Resp 16   Ht 5' 5.5" (1.664 m)   Wt 190 lb (86.2 kg)   LMP 06/25/2010 (Approximate)   BMI 31.14 kg/m     General appearance: alert, cooperative and appears stated age No exam performed today  Assessment: Menopausal female here for consultation regarding lab work (that is appropriate given menopausal state) Family hx of breast cancer in sister who was diagnosed when she was 15  Plan: Pt will call with any further questions or concerns.   ~15 minutes spent  with patient >50% of time was in face to face discussion of above.

## 2018-09-03 ENCOUNTER — Ambulatory Visit (INDEPENDENT_AMBULATORY_CARE_PROVIDER_SITE_OTHER): Payer: 59 | Admitting: Allergy

## 2018-09-03 ENCOUNTER — Other Ambulatory Visit: Payer: Self-pay

## 2018-09-03 ENCOUNTER — Encounter: Payer: Self-pay | Admitting: Allergy

## 2018-09-03 VITALS — BP 102/82 | HR 76 | Temp 98.4°F | Resp 16 | Ht 64.92 in | Wt 188.8 lb

## 2018-09-03 DIAGNOSIS — J4542 Moderate persistent asthma with status asthmaticus: Secondary | ICD-10-CM | POA: Insufficient documentation

## 2018-09-03 DIAGNOSIS — J3089 Other allergic rhinitis: Secondary | ICD-10-CM | POA: Insufficient documentation

## 2018-09-03 DIAGNOSIS — J4540 Moderate persistent asthma, uncomplicated: Secondary | ICD-10-CM | POA: Diagnosis not present

## 2018-09-03 DIAGNOSIS — R21 Rash and other nonspecific skin eruption: Secondary | ICD-10-CM

## 2018-09-03 DIAGNOSIS — J4520 Mild intermittent asthma, uncomplicated: Secondary | ICD-10-CM | POA: Insufficient documentation

## 2018-09-03 MED ORDER — FLUTICASONE FUROATE-VILANTEROL 100-25 MCG/INH IN AEPB: 1.0000 | INHALATION_SPRAY | Freq: Every day | RESPIRATORY_TRACT | 0 refills | Status: DC

## 2018-09-03 NOTE — Assessment & Plan Note (Signed)
Some mild rhinoconjunctivitis symptoms for the past 20+ years mainly during the spring.  Used over-the-counter antihistamines with good benefit.  Testing done over 25 years ago was positive to pollen per patient report.  Today's skin testing showed: Positive to dust mites, ragweed, tree pollen, grass, mold, cat, and dog.  Discussed environmental control measures.  Zyrtec should also help with her rhinoconjunctivitis symptoms.

## 2018-09-03 NOTE — Patient Instructions (Addendum)
Today's skin testing showed: Positive to dust mites, ragweed, tree pollen, grass, mold, cat, and dog. Negative to foods. No dietary restrictions.   Your breathing test was not so good today.   Rash:  Continue zyrtec 10mg  once a day and may increase to twice a day if needed as long as it does not cause drowsiness.  Consider patch testing in future - will need to come in on Monday/Wednesday/Friday.  Asthma . Daily controller medication(s): start Breo 100 1 puff daily and rinse mouth afterwards.  . Prior to physical activity: May use albuterol rescue inhaler 2 puffs 5 to 15 minutes prior to strenuous physical activities. Marland Kitchen Rescue medications: May use albuterol rescue inhaler 2 puffs or nebulizer every 4 to 6 hours as needed for shortness of breath, chest tightness, coughing, and wheezing. Monitor frequency of use.  . Asthma control goals:  o Full participation in all desired activities (may need albuterol before activity) o Albuterol use two times or less a week on average (not counting use with activity) o Cough interfering with sleep two times or less a month o Oral steroids no more than once a year o No hospitalizations  Follow up in 2 months  Skin care recommendations  Bath time: . Always use lukewarm water. AVOID very hot or cold water. Marland Kitchen Keep bathing time to 5-10 minutes. . Do NOT use bubble bath. . Use a mild soap and use just enough to wash the dirty areas. . Do NOT scrub skin vigorously.  . After bathing, pat dry your skin with a towel. Do NOT rub or scrub the skin.  Moisturizers and prescriptions:  . ALWAYS apply moisturizers immediately after bathing (within 3 minutes). This helps to lock-in moisture. . Use the moisturizer several times a day over the whole body. Kermit Balo summer moisturizers include: Aveeno, CeraVe, Cetaphil. Kermit Balo winter moisturizers include: Aquaphor, Vaseline, Cerave, Cetaphil, Eucerin, Vanicream. . When using moisturizers along with medications, the  moisturizer should be applied about one hour after applying the medication to prevent diluting effect of the medication or moisturize around where you applied the medications. When not using medications, the moisturizer can be continued twice daily as maintenance.  Laundry and clothing: . Avoid laundry products with added color or perfumes. . Use unscented hypo-allergenic laundry products such as Tide free, Cheer free & gentle, and All free and clear.  . If the skin still seems dry or sensitive, you can try double-rinsing the clothes. . Avoid tight or scratchy clothing such as wool. . Do not use fabric softeners or dyer sheets.  Reducing Pollen Exposure . Pollen seasons: trees (spring), grass (summer) and ragweed/weeds (fall). Marland Kitchen Keep windows closed in your home and car to lower pollen exposure.  Susa Simmonds air conditioning in the bedroom and throughout the house if possible.  . Avoid going out in dry windy days - especially early morning. . Pollen counts are highest between 5 - 10 AM and on dry, hot and windy days.  . Save outside activities for late afternoon or after a heavy rain, when pollen levels are lower.  . Avoid mowing of grass if you have grass pollen allergy. Marland Kitchen Be aware that pollen can also be transported indoors on people and pets.  . Dry your clothes in an automatic dryer rather than hanging them outside where they might collect pollen.  . Rinse hair and eyes before bedtime. Control of House Dust Mite Allergen . Dust mite allergens are a common trigger of allergy and asthma  symptoms. While they can be found throughout the house, these microscopic creatures thrive in warm, humid environments such as bedding, upholstered furniture and carpeting. . Because so much time is spent in the bedroom, it is essential to reduce mite levels there.  . Encase pillows, mattresses, and box springs in special allergen-proof fabric covers or airtight, zippered plastic covers.  . Bedding should be  washed weekly in hot water (130 F) and dried in a hot dryer. Allergen-proof covers are available for comforters and pillows that can't be regularly washed.  Wendee Copp the allergy-proof covers every few months. Minimize clutter in the bedroom. Keep pets out of the bedroom.  Marland Kitchen Keep humidity less than 50% by using a dehumidifier or air conditioning. You can buy a humidity measuring device called a hygrometer to monitor this.  . If possible, replace carpets with hardwood, linoleum, or washable area rugs. If that's not possible, vacuum frequently with a vacuum that has a HEPA filter. . Remove all upholstered furniture and non-washable window drapes from the bedroom. . Remove all non-washable stuffed toys from the bedroom.  Wash stuffed toys weekly. Pet Allergen Avoidance: . Contrary to popular opinion, there are no "hypoallergenic" breeds of dogs or cats. That is because people are not allergic to an animal's hair, but to an allergen found in the animal's saliva, dander (dead skin flakes) or urine. Pet allergy symptoms typically occur within minutes. For some people, symptoms can build up and become most severe 8 to 12 hours after contact with the animal. People with severe allergies can experience reactions in public places if dander has been transported on the pet owners' clothing. Marland Kitchen Keeping an animal outdoors is only a partial solution, since homes with pets in the yard still have higher concentrations of animal allergens. . Before getting a pet, ask your allergist to determine if you are allergic to animals. If your pet is already considered part of your family, try to minimize contact and keep the pet out of the bedroom and other rooms where you spend a great deal of time. . As with dust mites, vacuum carpets often or replace carpet with a hardwood floor, tile or linoleum. . High-efficiency particulate air (HEPA) cleaners can reduce allergen levels over time. . While dander and saliva are the source of cat  and dog allergens, urine is the source of allergens from rabbits, hamsters, mice and [place] pigs; so ask a non-allergic family member to clean the animal's cage. . If you have a pet allergy, talk to your allergist about the potential for allergy immunotherapy (allergy shots). This strategy can often provide long-term relief. Mold Control . Mold and fungi can grow on a variety of surfaces provided certain temperature and moisture conditions exist.  . Outdoor molds grow on plants, decaying vegetation and soil. The major outdoor mold, Alternaria and Cladosporium, are found in very high numbers during hot and dry conditions. Generally, a late summer - fall peak is seen for common outdoor fungal spores. Rain will temporarily lower outdoor mold spore count, but counts rise rapidly when the rainy period ends. . The most important indoor molds are Aspergillus and Penicillium. Dark, humid and poorly ventilated basements are ideal sites for mold growth. The next most common sites of mold growth are the bathroom and the kitchen. Outdoor (Seasonal) Mold Control . Use air conditioning and keep windows closed. . Avoid exposure to decaying vegetation. Marland Kitchen Avoid leaf raking. . Avoid grain handling. . Consider wearing a face mask if working in  moldy areas.  Indoor (Perennial) Mold Control  . Maintain humidity below 50%. . Get rid of mold growth on hard surfaces with water, detergent and, if necessary, 5% bleach (do not mix with other cleaners). Then dry the area completely. If mold covers an area more than 10 square feet, consider hiring an indoor environmental professional. . For clothing, washing with soap and water is best. If moldy items cannot be cleaned and dried, throw them away. . Remove sources e.g. contaminated carpets. . Repair and seal leaking roofs or pipes. Using dehumidifiers in damp basements may be helpful, but empty the water and clean units regularly to prevent mildew from forming. All rooms,  especially basements, bathrooms and kitchens, require ventilation and cleaning to deter mold and mildew growth. Avoid carpeting on concrete or damp floors, and storing items in damp areas.

## 2018-09-03 NOTE — Progress Notes (Signed)
New Patient Note  RE: Kathleen Farley MRN: [id number] DOB: 05-19-1966 Date of Office Visit: 09/03/2018  Referring provider: No ref. provider found Primary care provider: Patient, No Pcp Per  Chief Complaint: Rash (rash that spreads onto stomach and underneath the bra area, also in between fingers) and itching (in between thighs by vaginal area)  History of Present Illness: I had the pleasure of seeing Kathleen Farley for initial evaluation at the [facility] on 09/03/2018. She is a 53 y.o. female, who is referred here by Dr. Sabra Heck (Ob/gyn) for the evaluation of rash.   Rash started about 1+ year ago. This started on her inner thigh initially but sometimes it would travel to the stomach, under the breast, and hands . Describes them as a flat rash which is pruritic. Individual rashes lasts about 1 hour after antihistamines. This usually occurs a few times a week. No ecchymosis upon resolution. Associated symptoms include: none. Suspected triggers are unknown. Denies any fevers, chills, changes in medications, foods, personal care products or recent infections. She has tried the following therapies: zyrtec with some benefit. Systemic steroids none. Currently on antihistamines.  Previous work up includes: none. Previous history of rash/hives: 3 years ago she had a rash under her breast which was thought to be shingles. Patient is up to date with the following cancer screening tests: stool study, mammogram, pap smears. She had a cat for the last 1 year but the rash appeared before this. The cat does go into the bedroom. She also had dogs in her home in the basement prior to this.   Assessment and Plan: Riley is a 53 y.o. female with: Rash and other nonspecific skin eruption Breaking out in rash mainly on her inner thigh, under the breasts and hands for the past 1 year.  Describes it as pruritic and red.  Symptoms resolved with antihistamines within 1 hour.  Denies any changes in  diet, medications, personal care products.  She did get a cat about a year ago but had the rash prior to that. CBC, TSH, CMP was all unremarkable.  Today's skin testing showed: Positive to dust mites, ragweed, tree pollen, grass, mold, cat, and dog. Negative to foods. No dietary restrictions.   Discussed with patient that I am concerned that the cat is contributing to her rash.  Discussed environmental control measures.  Continue zyrtec 10mg  once a day and may increase to twice a day if needed as long as it does not cause drowsiness.  Consider patch testing in future if symptoms are still persistent.  Discussed proper skin care measures.  Not well controlled moderate persistent asthma Patient had issues with wheezing for 25 years.  She used to be on Advair but stopped for unknown reasons.  Recently noticed worsening symptoms and using albuterol 1-2 times a day with good benefit.  Today's spirometry showed: mild obstructive disease with 49% improvement in FEV1 post bronchodilator treatment.  Patient has asthma which is most likely exacerbated by her environmental allergies. . Daily controller medication(s): Start Breo 100 1 puff daily and rinse mouth afterwards.  Demonstrated proper use and sample given. . Prior to physical activity: May use albuterol rescue inhaler 2 puffs 5 to 15 minutes prior to strenuous physical activities. Marland Kitchen Rescue medications: May use albuterol rescue inhaler 2 puffs or nebulizer every 4 to 6 hours as needed for shortness of breath, chest tightness, coughing, and wheezing. Monitor frequency of use.   Other allergic rhinitis Some mild rhinoconjunctivitis  symptoms for the past 20+ years mainly during the spring.  Used over-the-counter antihistamines with good benefit.  Testing done over 25 years ago was positive to pollen per patient report.  Today's skin testing showed: Positive to dust mites, ragweed, tree pollen, grass, mold, cat, and dog.  Discussed environmental  control measures.  Zyrtec should also help with her rhinoconjunctivitis symptoms.  Return in about 2 months (around 11/03/2018).  Meds ordered this encounter  Medications  . fluticasone furoate-vilanterol (BREO ELLIPTA) 100-25 MCG/INH AEPB    Sig: Inhale 1 puff into the lungs daily.    Dispense:  60 each    Refill:  0   Other allergy screening: Asthma: yes  She reports symptoms of wheezing for 25 years. Current medications include albuterol prn which help. She tried the following inhalers: Advair. Main asthma triggers are cold weather, spring time, exercise. In the last month, frequency of asthma symptoms: 0x/week. Frequency of nocturnal symptoms: 0x/month. Frequency of SABA use: 1-2x/daily. Interference with physical activity: yes. Sleep is undisturbed. In the last 12 months, emergency room visits/urgent care visits/doctor office visits or hospitalizations due to asthma: 0. In the last 12 months, oral steroids courses: 0. Lifetime history of hospitalization for asthma: no. Prior intubations: no. Asthma was diagnosed at age 32s. History of pneumonia: no. She was evaluated by pulmonologist in the past. Smoking exposure: no. Up to date with flu vaccine: yes.   Rhino conjunctivitis: yes  She reports symptoms of itchy eyes. Symptoms have been going on for 20+ years. The symptoms are present during the spring. She has used OTC antihistamines with fair improvement in symptoms. Previous work up includes: skin testing over 25 years ago was positive to pollen per patient report.  Food allergy: no  Not avoiding any foods but had positive skin testing over 20 years ago to pecan, banana and tomatoes per patient report. Patient consumes these foods with no issues.  Medication allergy: no Hymenoptera allergy: no Urticaria: no Eczema:yes History of recurrent infections suggestive of immunodeficency: no  Diagnostics: Spirometry:  Tracings reviewed. Her effort: Good reproducible efforts. FVC: 2.36L  FEV1: 1.42L, 52% predicted FEV1/FVC ratio: 60% Interpretation: Spirometry consistent with mild obstructive disease with 49% improvement in FEV1 post bronchodilator treatment. Please see scanned spirometry results for details.  Skin Testing: Environmental allergy panel and select foods.  Positive test to: dust mites, ragweed, tree pollen, grass, mold, cat, and dog. Negative test to: foods.  Results discussed with patient/family.   Food Adult Perc - 09/03/18 1000    Time Antigen Placed  5686    Allergen Manufacturer  Lavella Hammock    Location  Back    Number of allergen test  13    Control-Histamine 1 mg/ml  4+    1. Peanut  Negative    2. Soybean  Negative    3. Wheat  Negative    4. Sesame  Negative    5. Milk, cow  Negative    6. Egg White, Chicken  Negative    7. Casein  Negative    8. Shellfish Mix  Negative    9. Fish Mix  Negative    10. Cashew  Negative    11. Pecan Food  Negative    42. Tomato  Negative    57. Banana  Negative       Past Medical History: Patient Active Problem List   Diagnosis Date Noted  . Not well controlled moderate persistent asthma 09/03/2018  . Other allergic rhinitis 09/03/2018  . Anxiety  state, unspecified 07/24/2013  . Chest pain 01/22/2013  . History of migraine headaches 02/20/2012  . Rash and other nonspecific skin eruption 05/16/2009  . Asthma 04/02/2007   Past Medical History:  Diagnosis Date  . ASTHMA 04/02/2007  . Asthma   . 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 BREAST LUMPECTOMY Right 1994  . breast papilloma excsision  07/2007  . SALPINGOOPHORECTOMY Right 2004   endometriosis   Medication List:  Current Outpatient Medications  Medication Sig Dispense Refill  . albuterol (PROVENTIL HFA;VENTOLIN HFA) 108 (90 Base) MCG/ACT inhaler USE 2 PUFFS EVERY 4 HOURS AS NEEDED FOR WHEEZING 8.5 Inhaler 4  . clobetasol ointment (TEMOVATE) 6.28 % Apply 1 application topically  2 (two) times daily. Apply as directed twice daily x 2 weeks.  Then decrease to nightly. 60 g 0  . Vitamin D, Ergocalciferol, (DRISDOL) 1.25 MG (50000 UT) CAPS capsule Take 1 capsule (50,000 Units total) by mouth every 7 (seven) days for 12 doses. 12 capsule 0  . fluticasone furoate-vilanterol (BREO ELLIPTA) 100-25 MCG/INH AEPB Inhale 1 puff into the lungs daily. 60 each 0   No current facility-administered medications for this visit.    Allergies: No Known Allergies Social History: Social History   Socioeconomic History  . Marital status: Married    Spouse name: Not on file  . Number of children: 2  . Years of education: Not on file  . Highest education level: Not on file  Occupational History    Employer: [employer]  Social Needs  . Financial resource strain: Not on file  . Food insecurity:    Worry: Not on file    Inability: Not on file  . Transportation needs:    Medical: Not on file    Non-medical: Not on file  Tobacco Use  . Smoking status: Never Smoker  . Smokeless tobacco: Never Used  Substance and Sexual Activity  . Alcohol use: Yes    Alcohol/week: 0.0 - 1.0 standard drinks  . Drug use: No  . Sexual activity: Yes    Partners: Male    Birth control/protection: Post-menopausal  Lifestyle  . Physical activity:    Days per week: Not on file    Minutes per session: Not on file  . Stress: Not on file  Relationships  . Social connections:    Talks on phone: Not on file    Gets together: Not on file    Attends religious service: Not on file    Active member of club or organization: Not on file    Attends meetings of clubs or organizations: Not on file    Relationship status: Not on file  Other Topics Concern  . Not on file  Social History Narrative  . Not on file   Lives in a 54 year old home. Smoking: denies Occupation: direct of workforce  Environmental History: Water Damage/mildew in the house: no Carpet in the family room: no Carpet in the  bedroom: no Heating: gas and electricc Cooling: central Pet: yes 1 cat x 1 year but had rash before this.   Family History: Family History  Problem Relation Age of Onset  . Bone cancer Sister 92       deceased  . Breast cancer Sister 37       lives in [place]  . Hypertension Brother   . Hypertension Brother   . Diabetes Son  on oral agents   Problem                               Relation Asthma                                   No  Eczema                                No  Food allergy                          No  Allergic rhino conjunctivitis     No   Review of Systems  Constitutional: Negative for appetite change, chills, fever and unexpected weight change.  HENT: Negative for congestion and rhinorrhea.   Eyes: Positive for itching.  Respiratory: Positive for wheezing. Negative for cough, chest tightness and shortness of breath.   Cardiovascular: Negative for chest pain.  Gastrointestinal: Negative for abdominal pain.  Genitourinary: Negative for difficulty urinating.  Skin: Positive for rash.  Allergic/Immunologic: Positive for environmental allergies. Negative for food allergies.  Neurological: Negative for headaches.   Objective: BP 102/82 (BP Location: Left Arm, Patient Position: Sitting, Cuff Size: Normal)   Pulse 76   Temp 98.4 F (36.9 C) (Oral)   Resp 16   Ht 5' 4.92" (1.649 m)   Wt 188 lb 12.8 oz (85.6 kg)   LMP 06/25/2010 (Approximate)   SpO2 96%   BMI 31.49 kg/m  Body mass index is 31.49 kg/m. Physical Exam  Constitutional: She is oriented to person, place, and time. She appears well-developed and well-nourished.  HENT:  Head: Normocephalic and atraumatic.  Right Ear: External ear normal.  Left Ear: External ear normal.  Nose: Nose normal.  Mouth/Throat: Oropharynx is clear and moist.  Eyes: Conjunctivae and EOM are normal.  Neck: Neck supple.  Cardiovascular: Normal rate, regular rhythm and normal heart sounds. Exam reveals no gallop  and no friction rub.  No murmur heard. Pulmonary/Chest: Effort normal and breath sounds normal. She has no wheezes. She has no rales.  Abdominal: Soft.  Lymphadenopathy:    She has no cervical adenopathy.  Neurological: She is alert and oriented to person, place, and time.  Skin: Skin is warm. No rash noted.  Psychiatric: She has a normal mood and affect. Her behavior is normal.  Nursing note and vitals reviewed.  The plan was reviewed with the patient/family, and all questions/concerned were addressed.  It was my pleasure to see Lakela today and participate in her care. Please feel free to contact me with any questions or concerns.  Sincerely,  Rexene Alberts, DO Allergy & Immunology  [facility] office: [phone] [facility] office: [phone]

## 2018-09-03 NOTE — Assessment & Plan Note (Addendum)
Breaking out in rash mainly on her inner thigh, under the breasts and hands for the past 1 year.  Describes it as pruritic and red.  Symptoms resolved with antihistamines within 1 hour.  Denies any changes in diet, medications, personal care products.  She did get a cat about a year ago but had the rash prior to that. CBC, TSH, CMP was all unremarkable.  Today's skin testing showed: Positive to dust mites, ragweed, tree pollen, grass, mold, cat, and dog. Negative to foods. No dietary restrictions.   Discussed with patient that I am concerned that the cat is contributing to her rash.  Discussed environmental control measures.  Continue zyrtec 10mg  once a day and may increase to twice a day if needed as long as it does not cause drowsiness.  Consider patch testing in future if symptoms are still persistent.  Discussed proper skin care measures.

## 2018-09-03 NOTE — Assessment & Plan Note (Signed)
Patient had issues with wheezing for 25 years.  She used to be on Advair but stopped for unknown reasons.  Recently noticed worsening symptoms and using albuterol 1-2 times a day with good benefit.  Today's spirometry showed: mild obstructive disease with 49% improvement in FEV1 post bronchodilator treatment.  Patient has asthma which is most likely exacerbated by her environmental allergies. . Daily controller medication(s): Start Breo 100 1 puff daily and rinse mouth afterwards.  Demonstrated proper use and sample given. . Prior to physical activity: May use albuterol rescue inhaler 2 puffs 5 to 15 minutes prior to strenuous physical activities. Marland Kitchen Rescue medications: May use albuterol rescue inhaler 2 puffs or nebulizer every 4 to 6 hours as needed for shortness of breath, chest tightness, coughing, and wheezing. Monitor frequency of use.

## 2018-09-08 ENCOUNTER — Telehealth: Payer: Self-pay | Admitting: Allergy

## 2018-09-08 MED ORDER — ALBUTEROL SULFATE HFA 108 (90 BASE) MCG/ACT IN AERS: INHALATION_SPRAY | RESPIRATORY_TRACT | 1 refills | Status: DC

## 2018-09-08 NOTE — Telephone Encounter (Signed)
Pt called and needs a refill on albuterol inhaler. [facility] [address] [phone].

## 2018-09-08 NOTE — Telephone Encounter (Signed)
Medication sent to pharmacy  

## 2018-09-10 ENCOUNTER — Ambulatory Visit: Payer: 59 | Admitting: Allergy

## 2018-09-29 ENCOUNTER — Other Ambulatory Visit: Payer: Self-pay | Admitting: Allergy

## 2018-10-14 ENCOUNTER — Other Ambulatory Visit: Payer: Self-pay | Admitting: Obstetrics & Gynecology

## 2018-10-16 ENCOUNTER — Encounter: Payer: Self-pay | Admitting: Obstetrics & Gynecology

## 2018-10-17 ENCOUNTER — Telehealth: Payer: Self-pay | Admitting: Obstetrics & Gynecology

## 2018-10-17 NOTE — Telephone Encounter (Signed)
Call to patient, left detailed message, ok per dpr. Advised our office will contact you to reschedule your Vit D and lipid panel labs once Covid19 restrictions are lifted. Will make additional recommendations once labs are repeated. If you have any additional questions/concerns. Return call to office.   Routing to covering provider for final review. Will close encounter.

## 2018-10-17 NOTE — Telephone Encounter (Signed)
Patient sent the following message through [facility]. Routing to triage to assist patient with request.  Hi dr Stanton Kidney. I hope you and your family are staying safe and healthy. I know my appointment to test for vitamin d got cancelled and I already finished my prescription. Do I need to take more or should I wait until we do the test again? If I need to take it again, can you please call it in for me?    Thanks so much and stay safe we need you now more than ever.   Kathleen Farley

## 2018-10-27 ENCOUNTER — Other Ambulatory Visit: Payer: 59

## 2018-10-31 ENCOUNTER — Other Ambulatory Visit: Payer: Self-pay | Admitting: Allergy

## 2018-11-03 ENCOUNTER — Other Ambulatory Visit: Payer: Self-pay

## 2018-11-03 ENCOUNTER — Encounter: Payer: Self-pay | Admitting: Allergy

## 2018-11-03 ENCOUNTER — Ambulatory Visit (INDEPENDENT_AMBULATORY_CARE_PROVIDER_SITE_OTHER): Payer: 59 | Admitting: Allergy

## 2018-11-03 DIAGNOSIS — J4540 Moderate persistent asthma, uncomplicated: Secondary | ICD-10-CM

## 2018-11-03 DIAGNOSIS — J302 Other seasonal allergic rhinitis: Secondary | ICD-10-CM | POA: Diagnosis not present

## 2018-11-03 DIAGNOSIS — H1010 Acute atopic conjunctivitis, unspecified eye: Secondary | ICD-10-CM | POA: Diagnosis not present

## 2018-11-03 DIAGNOSIS — J3089 Other allergic rhinitis: Secondary | ICD-10-CM

## 2018-11-03 DIAGNOSIS — R21 Rash and other nonspecific skin eruption: Secondary | ICD-10-CM

## 2018-11-03 MED ORDER — FLUTICASONE FUROATE-VILANTEROL 100-25 MCG/INH IN AEPB: 1.0000 | INHALATION_SPRAY | Freq: Every day | RESPIRATORY_TRACT | 5 refills | Status: DC

## 2018-11-03 MED ORDER — ALBUTEROL SULFATE HFA 108 (90 BASE) MCG/ACT IN AERS: INHALATION_SPRAY | RESPIRATORY_TRACT | 1 refills | Status: DC

## 2018-11-03 NOTE — Patient Instructions (Addendum)
Seasonal and perennial allergic rhinoconjunctivitis Past history - 2020 skin testing showed: Positive to dust mites, ragweed, tree pollen, grass, mold, cat, and dog.  Continue environmental control measures.  Continue zyrtec 10mg  daily.   Rash and other nonspecific skin eruption  Continue zyrtec 10mg  once a day and may increase to twice a day if needed as long as it does not cause drowsiness.  Continue proper skin care measures.  Asthma, well controlled, moderate persistent . Daily controller medication(s): continue Breo 100 1 puff daily and rinse mouth afterwards.   . Prior to physical activity: May use albuterol rescue inhaler 2 puffs 5 to 15 minutes prior to strenuous physical activities. Marland Kitchen Rescue medications: May use albuterol rescue inhaler 2 puffs or nebulizer every 4 to 6 hours as needed for shortness of breath, chest tightness, coughing, and wheezing. Monitor frequency of use.  Asthma control goals:  Full participation in all desired activities (may need albuterol before activity) Albuterol use two times or less a week on average (not counting use with activity) Cough interfering with sleep two times or less a month Oral steroids no more than once a year No hospitalizations  Follow up in 3 months

## 2018-11-03 NOTE — Assessment & Plan Note (Signed)
Past history - Patient had issues with wheezing for 25 years.  She used to be on Advair but stopped for unknown reasons.  Recently noticed worsening symptoms and using albuterol 1-2 times a day with good benefit. Interim history - Well-controlled with below regimen.  . Daily controller medication(s): continue Breo 100 1 puff daily and rinse mouth afterwards.   . Prior to physical activity: May use albuterol rescue inhaler 2 puffs 5 to 15 minutes prior to strenuous physical activities. Marland Kitchen Rescue medications: May use albuterol rescue inhaler 2 puffs or nebulizer every 4 to 6 hours as needed for shortness of breath, chest tightness, coughing, and wheezing. Monitor frequency of use.  . Get spirometry at next visit.

## 2018-11-03 NOTE — Assessment & Plan Note (Signed)
Past history - 2020 skin testing showed: Positive to dust mites, ragweed, tree pollen, grass, mold, cat, and dog. Interim history - Doing well with daily zyrtec.   Continue environmental control measures.  Continue zyrtec 10mg  daily.

## 2018-11-03 NOTE — Assessment & Plan Note (Signed)
Past history - Breaking out in rash mainly on her inner thigh, under the breasts and hands for the past 1 year.  Describes it as pruritic and red. CBC, TSH, CMP was all unremarkable. Interim history - Resolved with daily zyrtec.   Continue zyrtec 10mg  once a day and may increase to twice a day if needed as long as it does not cause drowsiness.  Continue proper skin care measures.

## 2018-11-03 NOTE — Progress Notes (Signed)
Start time:  71 Finish Time:  4696 Where are you located:  In [place] Do you give [place] permission to bill your insurance:  yes Are you signed up for my chart:  yes  Pt states that she has been taking Zyrtec and it is working and the rash is gone. Has not used her albuterol very often, feels like Memory Dance is working also.

## 2018-11-03 NOTE — Progress Notes (Signed)
RE: Kathleen Farley MRN: [id number] DOB: 04/23/1966 Date of Telemedicine Visit: 11/03/2018  Referring provider: No ref. provider found Primary care provider: Patient, No Pcp Per  Chief Complaint: Rash (rash is gone)   Telemedicine Follow Up Visit via WebEx: I connected with Kathleen Farley for a follow up on 11/03/18 by WebEx and verified that I am speaking with the correct person using two identifiers.   I discussed the limitations, risks, security and privacy concerns of performing an evaluation and management service by telemedicine and the availability of in person appointments. I also discussed with the patient that there may be a patient responsible charge related to this service. The patient expressed understanding and agreed to proceed.  Patient is at home. Provider is at the office.  Visit start time: 10:30AM Visit end time: 10:49AM Insurance consent/check in by: Dorris Fetch. Medical consent and medical assistant/nurse: Zerita Boers.  History of Present Illness: She is a 53 y.o. female, who is being followed for rash, asthma, allergic rhino conjunctivitis. Her previous allergy office visit was on 09/03/2018 with Dr. Maudie Mercury. Today is a regular follow up visit Rash/Allergic rhino conjunctivitis.  Doing much better on zyrtec and asymptomatic as long as she remembers taking the medication.   Not using eye drops or nasal sprays as symptoms are controlled with zyrtec.  Currently in [place] visiting her son.  Asthma Using breo daily with good benefit. Only used albuterol 1-2 times after running indoors since the last visit. Denies any SOB, coughing, wheezing, chest tightness, nocturnal awakenings, ER/urgent care visits or prednisone use since the last visit.  Assessment and Plan: Kathleen Farley is a 53 y.o. female with: Seasonal and perennial allergic rhinoconjunctivitis Past history - 2020 skin testing showed: Positive to dust mites, ragweed, tree pollen, grass, mold, cat, and dog. Interim  history - Doing well with daily zyrtec.   Continue environmental control measures.  Continue zyrtec 10mg  daily.   Rash and other nonspecific skin eruption Past history - Breaking out in rash mainly on her inner thigh, under the breasts and hands for the past 1 year.  Describes it as pruritic and red. CBC, TSH, CMP was all unremarkable. Interim history - Resolved with daily zyrtec.   Continue zyrtec 10mg  once a day and may increase to twice a day if needed as long as it does not cause drowsiness.  Continue proper skin care measures.  Asthma, well controlled, moderate persistent Past history - Patient had issues with wheezing for 25 years.  She used to be on Advair but stopped for unknown reasons.  Recently noticed worsening symptoms and using albuterol 1-2 times a day with good benefit. Interim history - Well-controlled with below regimen.  . Daily controller medication(s): continue Breo 100 1 puff daily and rinse mouth afterwards.   . Prior to physical activity: May use albuterol rescue inhaler 2 puffs 5 to 15 minutes prior to strenuous physical activities. Marland Kitchen Rescue medications: May use albuterol rescue inhaler 2 puffs or nebulizer every 4 to 6 hours as needed for shortness of breath, chest tightness, coughing, and wheezing. Monitor frequency of use.  . Get spirometry at next visit.  Return in about 3 months (around 02/03/2019).  Meds ordered this encounter  Medications  . albuterol (VENTOLIN HFA) 108 (90 Base) MCG/ACT inhaler    Sig: USE 2 PUFFS EVERY 4 HOURS AS NEEDED FOR WHEEZING    Dispense:  1 Inhaler    Refill:  1  . fluticasone furoate-vilanterol (BREO ELLIPTA) 100-25 MCG/INH AEPB  Sig: Inhale 1 puff into the lungs daily.    Dispense:  60 each    Refill:  5   Diagnostics: None.  Medication List:  Current Outpatient Medications  Medication Sig Dispense Refill  . albuterol (VENTOLIN HFA) 108 (90 Base) MCG/ACT inhaler USE 2 PUFFS EVERY 4 HOURS AS NEEDED FOR WHEEZING 1  Inhaler 1  . clobetasol ointment (TEMOVATE) 2.13 % Apply 1 application topically 2 (two) times daily. Apply as directed twice daily x 2 weeks.  Then decrease to nightly. 60 g 0  . fluticasone furoate-vilanterol (BREO ELLIPTA) 100-25 MCG/INH AEPB Inhale 1 puff into the lungs daily. 60 each 5   No current facility-administered medications for this visit.    Allergies: No Known Allergies I reviewed her past medical history, social history, family history, and environmental history and no significant changes have been reported from previous visit on 09/03/2018.  Review of Systems  Constitutional: Negative for appetite change, chills, fever and unexpected weight change.  HENT: Negative for congestion and rhinorrhea.   Eyes: Negative for itching.  Respiratory: Negative for cough, chest tightness, shortness of breath and wheezing.   Cardiovascular: Negative for chest pain.  Gastrointestinal: Negative for abdominal pain.  Genitourinary: Negative for difficulty urinating.  Skin: Negative for rash.  Allergic/Immunologic: Positive for environmental allergies. Negative for food allergies.  Neurological: Negative for headaches.   Objective: Physical Exam Not obtained as encounter was done via WebEx.  Previous notes and tests were reviewed.  I discussed the assessment and treatment plan with the patient. The patient was provided an opportunity to ask questions and all were answered. The patient agreed with the plan and demonstrated an understanding of the instructions. After visit summary/patient instructions available via mychart.   The patient was advised to call back or seek an in-person evaluation if the symptoms worsen or if the condition fails to improve as anticipated.  I provided 19 minutes of video-face-to-face time during this encounter.  It was my pleasure to participate in [place] Doner's care today. Please feel free to contact me with any questions or concerns.   Sincerely,  Rexene Alberts, DO Allergy & Immunology  [facility] office: [phone] [facility] office: [phone]

## 2018-12-02 ENCOUNTER — Ambulatory Visit (INDEPENDENT_AMBULATORY_CARE_PROVIDER_SITE_OTHER): Payer: 59 | Admitting: Family Medicine

## 2018-12-02 ENCOUNTER — Encounter: Payer: Self-pay | Admitting: Family Medicine

## 2018-12-02 ENCOUNTER — Telehealth: Payer: 59 | Admitting: Physician Assistant

## 2018-12-02 DIAGNOSIS — M5410 Radiculopathy, site unspecified: Secondary | ICD-10-CM | POA: Diagnosis not present

## 2018-12-02 DIAGNOSIS — E559 Vitamin D deficiency, unspecified: Secondary | ICD-10-CM

## 2018-12-02 DIAGNOSIS — E782 Mixed hyperlipidemia: Secondary | ICD-10-CM | POA: Insufficient documentation

## 2018-12-02 DIAGNOSIS — M79605 Pain in left leg: Secondary | ICD-10-CM

## 2018-12-02 DIAGNOSIS — M545 Low back pain, unspecified: Secondary | ICD-10-CM

## 2018-12-02 MED ORDER — PREDNISONE 20 MG PO TABS: ORAL_TABLET | ORAL | 0 refills | Status: AC

## 2018-12-02 MED ORDER — NAPROXEN 500 MG PO TABS: 500.0000 mg | ORAL_TABLET | Freq: Two times a day (BID) | ORAL | 0 refills | Status: DC

## 2018-12-02 MED ORDER — CYCLOBENZAPRINE HCL 10 MG PO TABS: 5.0000 mg | ORAL_TABLET | Freq: Three times a day (TID) | ORAL | 0 refills | Status: DC | PRN

## 2018-12-02 NOTE — Assessment & Plan Note (Signed)
She completed treatment with ergocalciferol. Recommend starting OTC vitamin D 2000 units daily. 25 OH vitamin D can be rechecked in 01/2019.

## 2018-12-02 NOTE — Progress Notes (Signed)
Virtual Visit via Video Note   I connected with Kathleen Farley on 12/03/18 at  9:30 AM EDT by a video enabled telemedicine application and verified that I am speaking with the correct person using two identifiers.  Location patient: home Location provider:home office Persons participating in the virtual visit: patient, provider  I discussed the limitations of evaluation and management by telemedicine and the availability of in person appointments. The patient expressed understanding and agreed to proceed.   HPI: Kathleen Farley is a 53 yo female with Hx og HLD,allergies,and asthma, who is being seen today to establish care. Former PCP: Dr Kennieth Rad. Last CPE:On 07/18/18 with Dr Ninfa Meeker.  HLD,she is on non pharmacologic treatment. She has not been consistent with following low fat diet.  Lab Results  Component Value Date   CHOL 233 (H) 07/18/2018   HDL 43 07/18/2018   LDLCALC 127 (H) 07/18/2018   TRIG 314 (H) 07/18/2018   CHOLHDL 5.4 (H) 07/18/2018   Vit D deficiency, she completed Ergocalciferol 50,000 U weekly x 12 weeks. She is not on vit D supplementation at this time.  Concerned about LLE pain.  For a while she has had pain that extends from posterior aspect of left knee to posterior aspect of ankle. No associated edema or erythema. Pain is severe, exacerbated by walking for half mile (8 minutes), improves with rest but does not resolve right away. She describes pain like a "cramp" but she does not feel muscle tight. Denies distal cyanosis or cold sensation. Problem has been stable. Negative for trauma. She has not tried OTC treatments.  No Hx of tobacco use.  For the past 2 days she has pain at the iliac crest level, lateral aspect, radiated to posterior aspect of the right thigh, also having intermittent numbness. Negative for back pain, saddle anesthesia, or urine/bowel incontinence. No prior Hx.  ROS: See pertinent positives and negatives per HPI.  Past Medical History:   Diagnosis Date  . ASTHMA 04/02/2007  . Asthma   . 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 BREAST LUMPECTOMY Right 1994  . breast papilloma excsision  07/2007  . SALPINGOOPHORECTOMY Right 2004   endometriosis    Family History  Problem Relation Age of Onset  . Bone cancer Sister 73       deceased  . Breast cancer Sister 29       lives in [place]  . Hypertension Brother   . Hypertension Brother   . Diabetes Son        on oral agents    Social History   Socioeconomic History  . Marital status: Married    Spouse name: Not on file  . Number of children: 2  . Years of education: Not on file  . Highest education level: Not on file  Occupational History    Employer: [employer]  Social Needs  . Financial resource strain: Not on file  . Food insecurity:    Worry: Not on file    Inability: Not on file  . Transportation needs:    Medical: Not on file    Non-medical: Not on file  Tobacco Use  . Smoking status: Never Smoker  . Smokeless tobacco: Never Used  Substance and Sexual Activity  . Alcohol use: Yes    Alcohol/week: 0.0 - 1.0 standard drinks  . Drug use: No  . Sexual activity: Yes    Partners: Male    Birth control/protection: Post-menopausal  Lifestyle  . Physical activity:    Days per week: Not on file    Minutes per session: Not on file  . Stress: Not on file  Relationships  . Social connections:    Talks on phone: Not on file    Gets together: Not on file    Attends religious service: Not on file    Active member of club or organization: Not on file    Attends meetings of clubs or organizations: Not on file    Relationship status: Not on file  . Intimate partner violence:    Fear of current or ex partner: Not on file    Emotionally abused: Not on file    Physically abused: Not on file    Forced sexual activity: Not on file  Other Topics Concern  . Not on file  Social History Narrative  . Not on  file      Current Outpatient Medications:  .  albuterol (VENTOLIN HFA) 108 (90 Base) MCG/ACT inhaler, USE 2 PUFFS EVERY 4 HOURS AS NEEDED FOR WHEEZING, Disp: 1 Inhaler, Rfl: 1 .  clobetasol ointment (TEMOVATE) 6.96 %, Apply 1 application topically 2 (two) times daily. Apply as directed twice daily x 2 weeks.  Then decrease to nightly., Disp: 60 g, Rfl: 0 .  cyclobenzaprine (FLEXERIL) 10 MG tablet, Take 0.5-1 tablets (5-10 mg total) by mouth 3 (three) times daily as needed for muscle spasms., Disp: 30 tablet, Rfl: 0 .  fluticasone furoate-vilanterol (BREO ELLIPTA) 100-25 MCG/INH AEPB, Inhale 1 puff into the lungs daily., Disp: 60 each, Rfl: 5 .  naproxen (NAPROSYN) 500 MG tablet, Take 1 tablet (500 mg total) by mouth 2 (two) times daily with a meal., Disp: 30 tablet, Rfl: 0 .  predniSONE (DELTASONE) 20 MG tablet, 3 tabs for 3 days, 2 tabs for 3 days, 1 tabs for 3 days, and 1/2 tab for 3 days. Take tables together with breakfast., Disp: 20 tablet, Rfl: 0  EXAM:  VITALS per patient if applicable:N/A  GENERAL: alert, oriented, appears well and in no acute distress  HEENT: atraumatic, conjunctiva clear, no obvious facial abnormalities on inspection.  NECK: normal movements of the head and neck  LUNGS: on inspection no signs of respiratory distress, breathing rate appears normal, no obvious gross SOB, gasping or wheezing  CV: no obvious cyanosis  Kathleen: moves all visible extremities without noticeable abnormality  PSYCH/NEURO: pleasant and cooperative, no obvious depression or anxiety, speech and thought processing grossly intact  ASSESSMENT AND PLAN:  Discussed the following assessment and plan:  Pain of left lower extremity We discussed possible etiologies,including PAD,vein disease,radicular pain among some. No further work up at this time,will consider ABI if symptom is persistent after completing steroid taper. For now we will treat as radicular pain. Avoid trigger factors for  now. She was clearly instructed about warning signs.  Radicular pain - Plan: predniSONE (DELTASONE) 20 MG tablet After discussion of some side effects,she agrees with trying Prednisone taper. Instructed about warning signs. If numbness of LLE is persistent for 4-6 weeks or gets worse,lumbar MRI needs to be considered.  Vitamin D deficiency, unspecified She completed treatment with ergocalciferol. Recommend starting OTC vitamin D 2000 units daily. 25 OH vitamin D can be rechecked in 01/2019.  Mixed hyperlipidemia For now she is not interested in pharmacologic treatment. Low-fat diet discussed and recommended. Lipid panel can be repeated in 01/2019.     I discussed the assessment and treatment plan with the patient. She was provided an  opportunity to ask questions and all were answered. The patient agreed with the plan and demonstrated an understanding of the instructions.   The patient was advised to call back or seek an in-person evaluation if the symptoms worsen or if the condition fails to improve as anticipated.  Return in about 4 weeks (around 12/30/2018) for ideally in office..    Betty Martinique, MD

## 2018-12-02 NOTE — Assessment & Plan Note (Signed)
For now she is not interested in pharmacologic treatment. Low-fat diet discussed and recommended. Lipid panel can be repeated in 01/2019.

## 2018-12-02 NOTE — Progress Notes (Signed)
We are sorry that you are not feeling well.  Here is how we plan to help!  Based on what you have shared with me it looks like you mostly have acute back pain.  Acute back pain is defined as musculoskeletal pain that can resolve in 1-3 weeks with conservative treatment.  I have prescribed Naprosyn 500 mg twice a day non-steroid anti-inflammatory (NSAID) as well as Flexeril 10 mg every eight hours as needed which is a muscle relaxer  Some patients experience stomach irritation or in increased heartburn with anti-inflammatory drugs.  Please keep in mind that muscle relaxer's can cause fatigue and should not be taken while at work or driving.  Back pain is very common.  The pain often gets better over time.  The cause of back pain is usually not dangerous.  Most people can learn to manage their back pain on their own.  Home Care Stay active.  Start with short walks on flat ground if you can.  Try to walk farther each day. Do not sit, drive or stand in one place for more than 30 minutes.  Do not stay in bed. Do not avoid exercise or work.  Activity can help your back heal faster. Be careful when you bend or lift an object.  Bend at your knees, keep the object close to you, and do not twist. Sleep on a firm mattress.  Lie on your side, and bend your knees.  If you lie on your back, put a pillow under your knees. Only take medicines as told by your doctor. Put ice on the injured area. Put ice in a plastic bag Place a towel between your skin and the bag Leave the ice on for 15-20 minutes, 3-4 times a day for the first 2-3 days. 210 After that, you can switch between ice and heat packs. Ask your doctor about back exercises or massage. Avoid feeling anxious or stressed.  Find good ways to deal with stress, such as exercise.  Get Help Right Way If: Your pain does not go away with rest or medicine. Your pain does not go away in 1 week. You have new problems. You do not feel well. The pain spreads  into your legs. You cannot control when you poop (bowel movement) or pee (urinate) You feel sick to your stomach (nauseous) or throw up (vomit) You have belly (abdominal) pain. You feel like you may pass out (faint). If you develop a fever.  Make Sure you: Understand these instructions. Will watch your condition Will get help right away if you are not doing well or get worse.  Your e-visit answers were reviewed by a board certified advanced clinical practitioner to complete your personal care plan.  Depending on the condition, your plan could have included both over the counter or prescription medications.  If there is a problem please reply once you have received a response from your provider.  Your safety is important to [place].  If you have drug allergies check your prescription carefully.    You can use MyChart to ask questions about today's visit, request a non-urgent call back, or ask for a work or school excuse for 24 hours related to this e-Visit. If it has been greater than 24 hours you will need to follow up with your provider, or enter a new e-Visit to address those concerns.  You will get an e-mail in the next two days asking about your experience.  I hope that your e-visit has been valuable  and will speed your recovery. Thank you for using e-visits.   ===View-only below this line===   ----- Message -----    From: Kathleen Farley    Sent: 12/02/2018  9:37 AM EDT      To: E-Visit Mailing List Subject: E-Visit Submission: Back Pain  E-Visit Submission: Back Pain --------------------------------  Question: Where are you having pain Answer:   Buttocks  Question: Does the pain extend into your legs? Answer:   Yes, into my left leg  Question: Are you having any numbness or weakness of the legs? Answer:   Yes  Question: Does this pain radiate to the abdomen or groin? Answer:   No  Question: How bad is the pain? Answer:   The pain is severe  Question: Did you have an  injury that caused the pain? Answer:   No, I cannot remember an injury  Question: How long has the pain been present? Answer:   More than 4 weeks  Question: Have you had back pain in the past? Answer:   I have had back pain before, but this is markedly different  Question: Do you have a history of fever associated with your back pain? Answer:   No  Question: Please list any medications you have previously taken for back pain. Answer:   albuterol and fluticasone  Question: Do you have a fever? Answer:   No, I do not have a fever  Question: Do you have any of the following? Answer:   None of the above  Question: What makes the pain worse? Answer:   Strenuous activity  Question: What makes the pain better Answer:   None of the above  Question: Do you get relief with certain positions (even if only partial relief)? Answer:   No  Question: Have you ever been diagnosed with cancer? Answer:   No  Question: Have you ever been diagnosed with arthritis? Answer:   No  Question: Have you ever been diagnosed with osteoporosis or any other bone weakness? Answer:   No  Question: Have you ever had surgery on your back or spine? Answer:   No  Question: Do you have a history of Intravenous Drug Use? Answer:   No  Question: What is your usual health status? Answer:   I am active and can move normally  Question: Please list your medication allergies that you may have ? (If 'none' , please list as 'none') Answer:   none  Question: Please list any additional comments  Answer:     A total of 5-10 minutes was spent evaluating this patients questionnaire and formulating a plan of care.

## 2019-01-05 ENCOUNTER — Ambulatory Visit: Payer: 59 | Admitting: Physician Assistant

## 2019-02-04 ENCOUNTER — Ambulatory Visit: Payer: 59 | Admitting: Allergy

## 2019-02-05 ENCOUNTER — Other Ambulatory Visit: Payer: Self-pay

## 2019-02-05 ENCOUNTER — Ambulatory Visit (INDEPENDENT_AMBULATORY_CARE_PROVIDER_SITE_OTHER): Payer: 59 | Admitting: Allergy

## 2019-02-05 ENCOUNTER — Encounter: Payer: Self-pay | Admitting: Allergy

## 2019-02-05 DIAGNOSIS — J4540 Moderate persistent asthma, uncomplicated: Secondary | ICD-10-CM | POA: Diagnosis not present

## 2019-02-05 DIAGNOSIS — H1010 Acute atopic conjunctivitis, unspecified eye: Secondary | ICD-10-CM

## 2019-02-05 DIAGNOSIS — J302 Other seasonal allergic rhinitis: Secondary | ICD-10-CM

## 2019-02-05 DIAGNOSIS — L299 Pruritus, unspecified: Secondary | ICD-10-CM

## 2019-02-05 DIAGNOSIS — J3089 Other allergic rhinitis: Secondary | ICD-10-CM

## 2019-02-05 MED ORDER — BREO ELLIPTA 100-25 MCG/INH IN AEPB: 1.0000 | INHALATION_SPRAY | Freq: Every day | RESPIRATORY_TRACT | 5 refills | Status: DC

## 2019-02-05 MED ORDER — BREO ELLIPTA 100-25 MCG/INH IN AEPB: 1.0000 | INHALATION_SPRAY | Freq: Every day | RESPIRATORY_TRACT | 1 refills | Status: DC

## 2019-02-05 NOTE — Assessment & Plan Note (Addendum)
Past history - 2020 skin testing showed: Positive to dust mites, ragweed, tree pollen, grass, mold, cat, and dog. Has 1 dog and 1 cat at home.  Interim history - stable with daily zyrtec.   Continue environmental control measures.  Continue zyrtec 10mg  daily.

## 2019-02-05 NOTE — Assessment & Plan Note (Signed)
Past history - Patient had issues with wheezing for 25 years.  She used to be on Advair but stopped for unknown reasons.  Recently noticed worsening symptoms and using albuterol 1-2 times a day with good benefit. Interim history - Well-controlled with below regimen. Ran out of Breo 2 weeks ago and had to use albuterol once.  Today's spirometry showed mixed obstruction and restriction. Slightly improved from previous one.  . Daily controller medication(s): restart Breo 100 1 puff daily and rinse mouth afterwards.   . Prior to physical activity: May use albuterol rescue inhaler 2 puffs 5 to 15 minutes prior to strenuous physical activities. Marland Kitchen Rescue medications: May use albuterol rescue inhaler 2 puffs or nebulizer every 4 to 6 hours as needed for shortness of breath, chest tightness, coughing, and wheezing. Monitor frequency of use.

## 2019-02-05 NOTE — Addendum Note (Signed)
Addended by: Herbie Drape on: 02/05/2019 12:07 PM   Modules accepted: Orders

## 2019-02-05 NOTE — Progress Notes (Signed)
Follow Up Note  RE: Kathleen Farley MRN: [id number] DOB: 1966/05/26 Date of Office Visit: 02/05/2019  Referring provider: No ref. provider found Primary care provider: Patient, No Pcp Per  Chief Complaint: Asthma  History of Present Illness: I had the pleasure of seeing Kathleen Farley for a follow up visit at the [facility] on 02/05/2019. She is a 53 y.o. female, who is being followed for allergic rhino conjunctivitis, rash and asthma. Today she is here for regular follow up visit. Her previous allergy office visit was on 11/03/2018 via telemedicine with Dr. Maudie Mercury.   Seasonal and perennial allergic rhinoconjunctivitis Doing well with daily zyrtec and minimal symptoms. Does not cause drowsiness.   Rash/pruritus No rash not noticed itching if doesn't take zyrtec daily.   Asthma, well controlled, moderate persistent Patient ran out of Breo about 2 weeks ago and used albuterol once since then. While on Breo was doing well and was even able to go out for a run with no issues.  Denies any SOB, coughing, wheezing, chest tightness, nocturnal awakenings, ER/urgent care visits or prednisone use since the last visit.  Assessment and Plan: Kathleen Farley is a 53 y.o. female with: Asthma, well controlled, moderate persistent Past history - Patient had issues with wheezing for 25 years.  She used to be on Advair but stopped for unknown reasons.  Recently noticed worsening symptoms and using albuterol 1-2 times a day with good benefit. Interim history - Well-controlled with below regimen. Ran out of Breo 2 weeks ago and had to use albuterol once.  Today's spirometry showed mixed obstruction and restriction. Slightly improved from previous one.  . Daily controller medication(s): restart Breo 100 1 puff daily and rinse mouth afterwards.   . Prior to physical activity: May use albuterol rescue inhaler 2 puffs 5 to 15 minutes prior to strenuous physical activities. Marland Kitchen Rescue medications: May  use albuterol rescue inhaler 2 puffs or nebulizer every 4 to 6 hours as needed for shortness of breath, chest tightness, coughing, and wheezing. Monitor frequency of use.   Seasonal and perennial allergic rhinoconjunctivitis Past history - 2020 skin testing showed: Positive to dust mites, ragweed, tree pollen, grass, mold, cat, and dog. Has 1 dog and 1 cat at home.  Interim history - stable with daily zyrtec.   Continue environmental control measures.  Continue zyrtec 10mg  daily.   Pruritus Past history - Breaking out in rash mainly on her inner thigh, under the breasts and hands for the past 1 year. CBC, TSH, CMP was all unremarkable. Interim history - No rash but noticed itching if misses daily zyrtec. Most likely due to environmental allergies including her indoor cat and dog.   Continue zyrtec 10mg  once a day and may increase to twice a day if needed as long as it does not cause drowsiness.  Continue proper skin care measures.  Return in about 3 months (around 05/08/2019).  Meds ordered this encounter  Medications  . fluticasone furoate-vilanterol (BREO ELLIPTA) 100-25 MCG/INH AEPB    Sig: Inhale 1 puff into the lungs daily.    Dispense:  60 each    Refill:  5   Diagnostics: Spirometry:  Tracings reviewed. Her effort: Good reproducible efforts. FVC: 2.65L FEV1: 1.74L, 94% predicted FEV1/FVC ratio: 66% Interpretation: Spirometry consistent with mixed obstructive and restrictive disease.  Please see scanned spirometry results for details.  Medication List:  Current Outpatient Medications  Medication Sig Dispense Refill  . albuterol (VENTOLIN HFA) 108 (90 Base) MCG/ACT  inhaler USE 2 PUFFS EVERY 4 HOURS AS NEEDED FOR WHEEZING 1 Inhaler 1  . clobetasol ointment (TEMOVATE) 5.99 % Apply 1 application topically 2 (two) times daily. Apply as directed twice daily x 2 weeks.  Then decrease to nightly. 60 g 0  . cyclobenzaprine (FLEXERIL) 10 MG tablet Take 0.5-1 tablets (5-10 mg  total) by mouth 3 (three) times daily as needed for muscle spasms. 30 tablet 0  . diclofenac (VOLTAREN) 50 MG EC tablet Take 50 mg by mouth 2 (two) times daily with a meal.    . fluticasone furoate-vilanterol (BREO ELLIPTA) 100-25 MCG/INH AEPB Inhale 1 puff into the lungs daily. 60 each 5  . Ibuprofen 200 MG CAPS ibuprofen 200 mg capsule  Take 1 capsule every 6 hours by oral route.    . naproxen (NAPROSYN) 500 MG tablet Take 1 tablet (500 mg total) by mouth 2 (two) times daily with a meal. 30 tablet 0   No current facility-administered medications for this visit.    Allergies: No Known Allergies I reviewed her past medical history, social history, family history, and environmental history and no significant changes have been reported from previous visit on 11/03/2018.  Review of Systems  Constitutional: Negative for appetite change, chills, fever and unexpected weight change.  HENT: Negative for congestion and rhinorrhea.   Eyes: Negative for itching.  Respiratory: Negative for cough, chest tightness, shortness of breath and wheezing.   Cardiovascular: Negative for chest pain.  Gastrointestinal: Negative for abdominal pain.  Genitourinary: Negative for difficulty urinating.  Skin: Negative for rash.  Allergic/Immunologic: Positive for environmental allergies. Negative for food allergies.  Neurological: Negative for headaches.   Objective: LMP 06/25/2010 (Approximate)  There is no height or weight on file to calculate BMI. Physical Exam  Constitutional: She is oriented to person, place, and time. She appears well-developed and well-nourished.  HENT:  Head: Normocephalic and atraumatic.  Right Ear: External ear normal.  Left Ear: External ear normal.  Nose: Nose normal.  Mouth/Throat: Oropharynx is clear and moist.  Eyes: Conjunctivae and EOM are normal.  Neck: Neck supple.  Cardiovascular: Normal rate, regular rhythm and normal heart sounds. Exam reveals no gallop and no friction  rub.  No murmur heard. Pulmonary/Chest: Effort normal and breath sounds normal. She has no wheezes. She has no rales.  Neurological: She is alert and oriented to person, place, and time.  Skin: Skin is warm. No rash noted.  Psychiatric: She has a normal mood and affect. Her behavior is normal.  Nursing note and vitals reviewed.  Previous notes and tests were reviewed. The plan was reviewed with the patient/family, and all questions/concerned were addressed.  It was my pleasure to see Kathleen Farley today and participate in her care. Please feel free to contact me with any questions or concerns.  Sincerely,  Rexene Alberts, DO Allergy & Immunology  [facility] office: [phone] [facility] office: [place] office: [phone]

## 2019-02-05 NOTE — Assessment & Plan Note (Addendum)
Past history - Breaking out in rash mainly on her inner thigh, under the breasts and hands for the past 1 year. CBC, TSH, CMP was all unremarkable. Interim history - No rash but noticed itching if misses daily zyrtec. Most likely due to environmental allergies including her indoor cat and dog.   Continue zyrtec 10mg  once a day and may increase to twice a day if needed as long as it does not cause drowsiness.  Continue proper skin care measures.

## 2019-02-05 NOTE — Patient Instructions (Addendum)
Seasonal and perennial allergic rhinoconjunctivitis 2020 skin testing showed: Positive to dust mites, ragweed, tree pollen, grass, mold, cat, and dog.  Continue environmental control measures.  Continue zyrtec 10mg  daily.   Itching   Continue zyrtec 10mg  once a day and may increase to twice a day if needed as long as it does not cause drowsiness.  Continue proper skin care measures.  Asthma, well controlled, moderate persistent  Daily controller medication(s):restart Breo 100 1 puff daily and rinse mouth afterwards.   Prior to physical activity:May use albuterol rescue inhaler 2 puffs 5 to 15 minutes prior to strenuous physical activities.  Rescue medications:May use albuterol rescue inhaler 2 puffs or nebulizer every 4 to 6 hours as needed for shortness of breath, chest tightness, coughing, and wheezing. Monitor frequency of use.  Asthma control goals:  Full participation in all desired activities (may need albuterol before activity) Albuterol use two times or less a week on average (not counting use with activity) Cough interfering with sleep two times or less a month Oral steroids no more than once a year No hospitalizations  Follow up in 3 months via telemedicine or sooner if needed.

## 2019-05-14 ENCOUNTER — Ambulatory Visit: Payer: 59 | Admitting: Allergy

## 2019-08-21 ENCOUNTER — Other Ambulatory Visit: Payer: Self-pay | Admitting: Obstetrics & Gynecology

## 2019-08-26 MED ORDER — CLOBETASOL PROPIONATE 0.05 % EX OINT: TOPICAL_OINTMENT | CUTANEOUS | 0 refills | Status: DC

## 2019-11-17 NOTE — Telephone Encounter (Signed)
Message sent to Dr. Jordan for review. 

## 2019-11-17 NOTE — Telephone Encounter (Signed)
I am not sure which medication is being request. May need to be seen. I have not seen pt sinec 11/2018, she was supposed to follow in 12/2018. Thanks, BJ

## 2019-11-17 NOTE — Telephone Encounter (Signed)
Can this please be signed.  ° °Thanks  ° °

## 2019-11-20 ENCOUNTER — Ambulatory Visit: Payer: 59 | Admitting: Obstetrics & Gynecology

## 2019-11-20 NOTE — Telephone Encounter (Signed)
Message Routed to PCP for signature.   PCP out of the office until 11/30/2019.

## 2019-11-20 NOTE — Telephone Encounter (Signed)
This is an old message I didn't have authority to sign off on and just needed the encounter signed.   Thank you,

## 2019-12-01 ENCOUNTER — Encounter: Payer: Self-pay | Admitting: Obstetrics & Gynecology

## 2019-12-02 NOTE — Telephone Encounter (Signed)
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 Raliegh Ip and he did approve the inhaler." I do not understand why I am being asked to sign this, I do feel comfortable doing so. I prescribed Prednisone in 11/2018, virtual visit.  Thanks, BJ

## 2019-12-02 NOTE — Telephone Encounter (Signed)
Please see response from Dr. Martinique. Thanks

## 2019-12-11 NOTE — Progress Notes (Signed)
54 y.o. [id number] Married White or Caucasian female here for annual exam.  Doing well.  Didn't see one son for 13 months.  This was really hard for her.  The live in [place].   Didn't see other son for 4 months.  He is in [place].    Having some eye issues.  Followed by [facility] opthalmology.  Reports she is much better.  Vision is much improved at this point.  Denies vaginal bleeding.    Did get Covid vaccination.    Is involved in a study for weight loss medication.  Not sure if she is on the medication or placebo but does has more energy.  Has been on it for three weeks.    Patient's last menstrual period was 06/25/2010 (approximate).          Sexually active: No.  The current method of family planning is post menopausal status.    Exercising: Yes.    pilates & running Smoker:  no  Health Maintenance: Pap: 02-03-2016 neg HPV HR neg, 07-18-2018 neg History of abnormal Pap:  no MMG: 11-10-2019 category c, 11-19-2019 category b density birads 2:neg Colonoscopy:  cologuard neg 2020 BMD:  none TDaP: 2017 Pneumonia vaccine(s):  no Shingrix:   discussed this today Hep C testing: not done Screening Labs: obtained   reports that she has never smoked. She has never used smokeless tobacco. She reports current alcohol use. She reports that she does not use drugs.  Past Medical History:  Diagnosis Date  . ASTHMA 04/02/2007  . Asthma   . 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 BREAST LUMPECTOMY Right 1994  . breast papilloma excsision  07/2007  . SALPINGOOPHORECTOMY Right 2004   endometriosis    Current Outpatient Medications  Medication Sig Dispense Refill  . albuterol (VENTOLIN HFA) 108 (90 Base) MCG/ACT inhaler USE 2 PUFFS EVERY 4 HOURS AS NEEDED FOR WHEEZING 1 Inhaler 1  . cetirizine (ZYRTEC) 10 MG tablet Take by mouth.    . clobetasol ointment (TEMOVATE) 0.05 % Apply as directed twice daily for up to 5 days.  Do not use more than 5 days per  month. 60 g 0  . fluticasone furoate-vilanterol (BREO ELLIPTA) 100-25 MCG/INH AEPB Inhale 1 puff into the lungs daily. 84 each 1  . Ibuprofen 200 MG CAPS ibuprofen 200 mg capsule  Take 1 capsule every 6 hours by oral route.     No current facility-administered medications for this visit.    Family History  Problem Relation Age of Onset  . Bone cancer Sister 60       deceased  . Breast cancer Sister 38       lives in [place]  . Hypertension Brother   . Hypertension Brother   . Diabetes Son        on oral agents    Review of Systems  Constitutional: Negative.   HENT: Negative.   Eyes: Negative.   Respiratory: Negative.   Cardiovascular: Negative.   Gastrointestinal: Negative.   Endocrine: Negative.   Genitourinary: Negative.   Musculoskeletal: Negative.   Skin: Negative.   Allergic/Immunologic: Negative.   Neurological: Negative.   Hematological: Negative.   Psychiatric/Behavioral: Negative.     Exam:   BP 100/66   Pulse 74   Temp (!) 97.5 F (36.4 C) (Skin)   Resp 16   Ht 5\' 5"  (1.651 m)   Wt 186 lb (84.4 kg)   LMP 06/25/2010 (Approximate)  BMI 30.95 kg/m   Height: 5\' 5"  (165.1 cm)  General appearance: alert, cooperative and appears stated age Head: Normocephalic, without obvious abnormality, atraumatic Neck: no adenopathy, supple, symmetrical, trachea midline and thyroid normal to inspection and palpation Lungs: clear to auscultation bilaterally Breasts: normal appearance, no masses or tenderness Heart: regular rate and rhythm Abdomen: soft, non-tender; bowel sounds normal; no masses,  no organomegaly Extremities: extremities normal, atraumatic, no cyanosis or edema Skin: Skin color, texture, turgor normal. No rashes or lesions Lymph nodes: Cervical, supraclavicular, and axillary nodes normal. No abnormal inguinal nodes palpated Neurologic: Grossly normal   Pelvic: External genitalia:  no lesions              Urethra:  normal appearing urethra  with no masses, tenderness or lesions              Bartholins and Skenes: normal                 Vagina: normal appearing vagina with normal color and discharge, no lesions              Cervix: no lesions              Pap taken: Yes.   Bimanual Exam:  Uterus:  normal size, contour, position, consistency, mobility, non-tender              Adnexa: normal adnexa and no mass, fullness, tenderness               Rectovaginal: Confirms               Anus:  normal sphincter tone, no lesions  Chaperone, Terence Lux, CMA, was present for exam.  A:  Well Woman with normal exam PMP, no HRT H/o breast papilloma s/p resection Family hx of breast cancer in sister Tobey Bride Cusick lifetime risk for breast cancer 14.8% Low Vit D  Elevated lipids  P:   Mammogram guidelines reviewed.  Limited breast MRI discussed today.  Information given. pap smear with HR HPV obtained today Vit D and lipid testing obtained today Planning on having shingrix vaccination later this year Return annually or prn

## 2019-12-15 ENCOUNTER — Ambulatory Visit (INDEPENDENT_AMBULATORY_CARE_PROVIDER_SITE_OTHER): Payer: 59 | Admitting: Obstetrics & Gynecology

## 2019-12-15 ENCOUNTER — Other Ambulatory Visit: Payer: Self-pay

## 2019-12-15 ENCOUNTER — Encounter: Payer: Self-pay | Admitting: Obstetrics & Gynecology

## 2019-12-15 ENCOUNTER — Other Ambulatory Visit (HOSPITAL_COMMUNITY)
Admission: RE | Admit: 2019-12-15 | Discharge: 2019-12-15 | Disposition: A | Discharge status: Home / Self Care | Payer: 59 | Source: Ambulatory Visit | Attending: Obstetrics & Gynecology | Admitting: Obstetrics & Gynecology

## 2019-12-15 ENCOUNTER — Telehealth: Payer: Self-pay | Admitting: *Deleted

## 2019-12-15 VITALS — BP 100/66 | HR 74 | Temp 97.5°F | Resp 16 | Ht 65.0 in | Wt 186.0 lb

## 2019-12-15 DIAGNOSIS — Z124 Encounter for screening for malignant neoplasm of cervix: Secondary | ICD-10-CM

## 2019-12-15 DIAGNOSIS — Z0000 Encounter for general adult medical examination without abnormal findings: Secondary | ICD-10-CM

## 2019-12-15 DIAGNOSIS — Z9189 Other specified personal risk factors, not elsewhere classified: Secondary | ICD-10-CM

## 2019-12-15 DIAGNOSIS — Z803 Family history of malignant neoplasm of breast: Secondary | ICD-10-CM

## 2019-12-15 DIAGNOSIS — Z01419 Encounter for gynecological examination (general) (routine) without abnormal findings: Secondary | ICD-10-CM

## 2019-12-15 DIAGNOSIS — E785 Hyperlipidemia, unspecified: Secondary | ICD-10-CM | POA: Diagnosis not present

## 2019-12-15 NOTE — Telephone Encounter (Signed)
-----   Message from Megan Salon, MD sent at 12/15/2019 11:22 AM EDT ----- Regarding: abbreviated breast MRI Sharee Pimple, This pt has lifetime risk of 14.8% for breast cancer and would like ot have an abbreviated breast MRI. Can you call breast center to get scheduling started.  Thanks.  Vinnie Level

## 2019-12-15 NOTE — Telephone Encounter (Signed)
Order placed for screening abbreviated breast MRI at [facility].   Call to patient, advised as seen above. Advised patient [facility] will contact her directly to schedule and review out of pocket cost. Questions answered.  Patient is aware to call if any additional assistance is needed.   Placed in [place] hold.   Routing to provider for final review. Patient is agreeable to disposition. Will close encounter.  Cc: Hayley Carder

## 2019-12-16 ENCOUNTER — Encounter: Payer: Self-pay | Admitting: Obstetrics & Gynecology

## 2019-12-16 ENCOUNTER — Telehealth: Payer: Self-pay

## 2019-12-16 LAB — LIPID PANEL
Chol/HDL Ratio: 4 ratio (ref 0.0–4.4)
Cholesterol, Total: 242 mg/dL — ABNORMAL HIGH (ref 100–199)
HDL: 61 mg/dL (ref >39)
LDL Chol Calc (NIH): 160 mg/dL — ABNORMAL HIGH (ref 0–99)
Triglycerides: 118 mg/dL (ref 0–149)
VLDL Cholesterol Cal: 21 mg/dL (ref 5–40)

## 2019-12-16 LAB — CYTOLOGY - PAP
Comment: NEGATIVE
Diagnosis: NEGATIVE
High risk HPV: NEGATIVE

## 2019-12-16 LAB — VITAMIN D 25 HYDROXY (VIT D DEFICIENCY, FRACTURES): Vit D, 25-Hydroxy: 24.5 ng/mL — ABNORMAL LOW (ref 30.0–100.0)

## 2019-12-16 NOTE — Telephone Encounter (Signed)
My chart message sent to pt to start taking OTC Vit D and also regarding elevated cholesterol but low risk for cardiovascular disease as this point.  Ok to close encounter.  Thanks.

## 2019-12-16 NOTE — Telephone Encounter (Signed)
Routing to Dr. Sabra Heck to review 12/15/19 results and advise.

## 2019-12-16 NOTE — Telephone Encounter (Signed)
Encounter closed

## 2019-12-16 NOTE — Telephone Encounter (Signed)
[name], Kathleen Farley [facility] Good morning f. Based on my test results. Do I need to take any prescription for vitamin D? If yes can she send one for me.

## 2019-12-18 ENCOUNTER — Encounter: Payer: Self-pay | Admitting: Obstetrics & Gynecology

## 2020-01-04 ENCOUNTER — Telehealth: Payer: Self-pay

## 2020-01-04 NOTE — Telephone Encounter (Signed)
AEX 11/2019 PMP, no HRT H/o breast papilloma s/p resection Family hx of breast cancer in sister Tobey Bride Cusick lifetime risk for breast cancer 14.8% Low Vit D  Elevated lipids  Spoke with pt. Pt calling to give update to Dr Sabra Heck. Pt states got approval to use Testosterone for decreased libido from pharmquest study.  Advised pt, will review with Dr Sabra Heck and return call to pt with recommendations and advice. Pt agreeable.   Routing to Dr Sabra Heck.

## 2020-01-04 NOTE — Telephone Encounter (Signed)
Patient called in regards to discuss hormone replacements.

## 2020-01-20 ENCOUNTER — Telehealth: Payer: Self-pay | Admitting: Obstetrics & Gynecology

## 2020-01-20 NOTE — Telephone Encounter (Signed)
Spoke with patient. Patient is scheduled for an abbreviated breast MRI on 01/25/20, is requesting Rx for anxiety. Confirmed patient will have a driver. Confirmed pharmacy on file. Advised patient Dr. Sabra Heck is out of the office, will forward to her for review when she returns on 7/29, patient agreeable.   Dr. Sabra Heck -please advise on Rx.

## 2020-01-20 NOTE — Telephone Encounter (Signed)
Patient is scheduled for MRI and would like something for anxiety.

## 2020-01-22 ENCOUNTER — Other Ambulatory Visit: Payer: Self-pay | Admitting: Obstetrics & Gynecology

## 2020-01-22 MED ORDER — DIAZEPAM 5 MG PO TABS: ORAL_TABLET | ORAL | 0 refills | Status: DC

## 2020-01-22 NOTE — Telephone Encounter (Signed)
Spoke with patient. Advised patient of message as seen below from [place]. Patient verbalizes understanding. Encounter closed.

## 2020-01-22 NOTE — Telephone Encounter (Signed)
Ok to take valium 5mg  po x 1, one hour before procedure.  Can repeat in 1 hour if needed.  Rx for 2 will be sent to pharmacy.  I want to see results of MRI before ordering testosterone for her.  Just please let her know this.  Thanks.

## 2020-01-22 NOTE — Telephone Encounter (Signed)
Patient is calling to follow up on discussion.

## 2020-01-25 ENCOUNTER — Ambulatory Visit
Admission: RE | Admit: 2020-01-25 | Discharge: 2020-01-25 | Disposition: A | Discharge status: Home / Self Care | Payer: 59 | Source: Ambulatory Visit | Attending: Obstetrics & Gynecology | Admitting: Obstetrics & Gynecology

## 2020-01-25 ENCOUNTER — Other Ambulatory Visit: Payer: Self-pay

## 2020-01-25 DIAGNOSIS — Z9189 Other specified personal risk factors, not elsewhere classified: Secondary | ICD-10-CM

## 2020-01-25 DIAGNOSIS — Z803 Family history of malignant neoplasm of breast: Secondary | ICD-10-CM

## 2020-01-25 MED ORDER — GADOBUTROL 1 MMOL/ML IV SOLN
8.0000 mL | Freq: Once | INTRAVENOUS | Status: AC | PRN
  Administered 2020-01-25: 8 mL via INTRAVENOUS

## 2020-01-26 ENCOUNTER — Telehealth: Payer: Self-pay | Admitting: *Deleted

## 2020-01-26 NOTE — Telephone Encounter (Signed)
-----   Message from Megan Salon, MD sent at 01/26/2020  8:33 AM EDT ----- Please let pt know her breast MRI was negative.  Ok to remove from imaging hold.

## 2020-01-26 NOTE — Telephone Encounter (Signed)
Burnice Logan, RN  01/26/2020 11:10 AM EDT Back to Top    Spoke with patient, advised as seen below per Dr. Sabra Heck.  Patient verbalizes understanding and is agreeable.  Patient has additional questions about RX request, see telephone encounter dated 01/26/20.   Removed from IMG hold.      Patient is requesting to proceed with testosterone RX since MRI was negative.   Routing request to Dr. Sabra Heck.

## 2020-01-27 NOTE — Telephone Encounter (Signed)
As MRI was negative, would recommend topical 2% testosterone propionate 1/4 tsp to skin two times weekly with recheck testosterone level in 3 months.  Where does she want this rx sent?  Her son has a Journalist, newspaper that is out of state.

## 2020-01-27 NOTE — Telephone Encounter (Signed)
Left message for pt to return call to triage RN. 

## 2020-01-28 MED ORDER — NONFORMULARY OR COMPOUNDED ITEM: 0 refills | Status: DC

## 2020-01-28 NOTE — Telephone Encounter (Signed)
Spoke with pt. Pt given results and recommendations per Dr Sabra Heck. Pt agreeable and verbalized understanding. Pharmacy verified. Rx sent to pharmacy via fax after reviewed and signed by Dr Sabra Heck.   Pt has 3 month lab appt to recheck testosterone level on 05/03/20 at 845 am. Pt agreeable to date and time of appt.   Encounter closed.

## 2020-01-29 ENCOUNTER — Telehealth: Payer: Self-pay

## 2020-01-29 NOTE — Telephone Encounter (Signed)
Spoke with Sam at [facility]. Calling to confirm compounded  testosterone Rx apply 1/4 tsp. Confirming to apply 1.25 mL?   Placed on hold, reviewed with Dr. Sabra Heck.   Confirmed apply 1.25 mL to skin two times weekly . Read back Rx to Sam.   Encounter closed.

## 2020-01-29 NOTE — Telephone Encounter (Signed)
Pharmacy called in regards to discuss directions and quantity of compounded medication.

## 2020-04-22 ENCOUNTER — Encounter: Payer: Self-pay | Admitting: Family Medicine

## 2020-04-22 ENCOUNTER — Ambulatory Visit (INDEPENDENT_AMBULATORY_CARE_PROVIDER_SITE_OTHER): Payer: 59 | Admitting: Family Medicine

## 2020-04-22 ENCOUNTER — Other Ambulatory Visit: Payer: Self-pay

## 2020-04-22 VITALS — BP 120/70 | HR 83 | Resp 16 | Ht 65.0 in | Wt 164.0 lb

## 2020-04-22 DIAGNOSIS — N200 Calculus of kidney: Secondary | ICD-10-CM

## 2020-04-22 NOTE — Patient Instructions (Signed)
A few things to remember from today's visit:   Nephrolithiasis - Plan: Ambulatory referral to Urology  Bladder Stone  A bladder stone is a buildup of crystals made from the proteins and minerals found in urine. These substances build up when urine becomes too concentrated. Urine is concentrated when there is less water and more proteins and minerals in it. Bladder stones usually develop when a person has another medical condition that prevents the bladder from emptying completely. Crystals can form in the small amount of urine that is left in the bladder. Bladder stones that grow large can become painful and may block the flow of urine. What are the causes? This condition may be caused by:  An enlarged prostate, which prevents the bladder from emptying well.  An infection of a part of your urinary system (urinary tract infection, or UTI). This includes the: ? Kidneys. ? Bladder. ? Ureters. These are the tubes that carry urine to your bladder. ? Urethra. This is the tube that drains urine from your bladder.  A weak spot in the bladder that creates a small pouch (bladder diverticulum).  Nerve damage that may interfere with the signals from your brain to your bladder muscles (neurogenic bladder). This can result from conditions such as Parkinson's disease or spinal cord injuries. What increases the risk? This condition is more likely to develop in people who:  Get frequent UTIs.  Have another medical condition that affects the bladder.  Have a history of bladder surgery.  Have a spinal cord injury.  Have an abnormal shape of the bladder (deformity). What are the signs or symptoms? Common symptoms of this condition include:  Pain in the abdomen.  A need to urinate more often.  Difficulty or pain when urinating.  Blood in the urine.  Cloudy urine or urine that is dark in color.  Pain in the penis or testicles in men. Small bladder stones do not always cause symptoms. How  is this diagnosed? This condition may be diagnosed based on your symptoms, medical history, and physical exam. The physical exam will check for tenderness in your abdomen. For men, an exam in the rectum may be done to check the prostate gland.  You may have tests, such as: ? A urine test (urinalysis). ? A urine sample test to check for other infections (culture). ? Blood tests, including tests to look for a certain substance (creatinine). A creatinine level that is higher than normal could indicate a blockage. ? A procedure to check your bladder using a scope with a camera (cystoscopy).  You may also have imaging studies, such as: ? CT scan or ultrasound of your abdomen and the area between your hip bones (pelvis or pelvic area). ? An X-ray of your urinary system. How is this treated? This condition may be treated with:  Cystolitholapaxy. This procedure uses a laser, ultrasound, or other device to break the stone into smaller pieces. Fluids are used to flush the small pieces from the area.  Surgery to remove the stone.  A stent. This is a small mesh tube that is threaded into your ureter to make urine flow.  Medicines to treat pain. Follow these instructions at home: Medicines  Take over-the-counter and prescription medicines only as told by your health care provider.  Ask your health care provider if the medicine prescribed to you: ? Requires you to avoid driving or using heavy machinery. ? Can cause constipation. You may need to take these actions to prevent or treat constipation:  Take over-the-counter or prescription medicines.  Eat foods that are high in fiber, such as beans, whole grains, and fresh fruits and vegetables.  Limit foods that are high in fat and processed sugars, such as fried or sweet foods. Alcohol use  Do not drink alcohol if: ? Your health care provider tells you not to drink. ? You are pregnant, may be pregnant, or are planning to become pregnant.  If  you drink alcohol: ? Limit how much you drink to:  0-1 drink a day for women.  0-2 drinks a day for men. ? Be aware of how much alcohol is in your drink. In the [place] one drink equals one 12 oz bottle of beer (355 mL), one 5 oz glass of wine (148 mL), or one 1 oz glass of hard liquor (44 mL). Activity  Rest as told by your health care provider.  Return to your normal activities as told by your health care provider. Ask your health care provider what activities are safe for you. General instructions   Drink enough fluid to keep your urine pale yellow.  Tell your health care provider about any unusual symptoms related to urinating. Early diagnosis of an enlarged prostate and other bladder conditions may reduce your risk of getting bladder stones.  Do not use any products that contain nicotine or tobacco, such as cigarettes, e-cigarettes, or chewing tobacco. If you need help quitting, ask your health care provider.  Do not use drugs. Where to find more information Urology [place] [facility]): www.urologyhealth.org Contact a health care provider if you:  Have a fever.  Feel nauseous or vomit.  Are unable to urinate.  Have a large amount of blood in your urine. Get help right away if you:  Have severe back pain or pain in the lower part of your abdomen.  Cannot eat or drink without vomiting.  Vomit after taking your medicine. Summary  A bladder stone is a buildup of crystals made from the proteins and minerals found in urine. These substances build up when urine becomes too concentrated.  Bladder stones that grow large can become painful and may block the flow of urine.  Bladder stones may be treated with a laser, a stent, surgery, or pain medicines. This information is not intended to replace advice given to you by your health care provider. Make sure you discuss any questions you have with your health care provider. Document Revised: 01/01/2019 Document Reviewed:  01/01/2019 Elsevier Patient Education  [place].  Please be sure medication list is accurate. If a new problem present, please set up appointment sooner than planned today.

## 2020-04-22 NOTE — Progress Notes (Signed)
Chief Complaint  Patient presents with  . discuss bladder stone   HPI: [name] is a 54 y.o. female, who is here today with above concern. No history of hydronephrosis. She was visiting family in [place], where she was evaluated in the ER twice because of vomiting and diarrhea.  Abdominal US was done and incidentally a bladder stone was seen,18 mm bladder stone. She states that she gets similar symptoms, vomiting or diarrhea, every time she goes to see area.  Usually symptoms last 1 day but this time she was symptomatic for longer. Symptoms have resolved.  She is not having any fever, chills, abdominal pain, N/V, gross hematuria, or dysuria.  Patient has a copy of report, it is in arabic, so she did translated for me.  Review of Systems  Constitutional: Negative for appetite change and fatigue.  Gastrointestinal:       No changes in bowel habits.  Genitourinary: Negative for decreased urine volume, frequency, pelvic pain and urgency.  Musculoskeletal: Negative for back pain and gait problem.  Neurological: Negative for syncope and weakness.  Rest see pertinent positives and negatives per HPI.  Current Outpatient Medications on File Prior to Visit  Medication Sig Dispense Refill  . albuterol (VENTOLIN HFA) 108 (90 Base) MCG/ACT inhaler USE 2 PUFFS EVERY 4 HOURS AS NEEDED FOR WHEEZING 1 Inhaler 1  . cetirizine (ZYRTEC) 10 MG tablet Take by mouth.    . clobetasol ointment (TEMOVATE) 0.05 % Apply as directed twice daily for up to 5 days.  Do not use more than 5 days per month. 60 g 0  . fluticasone furoate-vilanterol (BREO ELLIPTA) 100-25 MCG/INH AEPB Inhale 1 puff into the lungs daily. 84 each 1  . Ibuprofen 200 MG CAPS ibuprofen 200 mg capsule  Take 1 capsule every 6 hours by oral route.    . NONFORMULARY OR COMPOUNDED ITEM topical 2% testosterone propionate 1/4 tsp to skin two times weekly. 1 each 0   No current facility-administered medications on file prior to visit.       Past Medical History:  Diagnosis Date  . ASTHMA 04/02/2007  . Asthma   . 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 Marital status: Married    Spouse name: Not on file  . Number of children: 2  . Years of education: Not on file  . Highest education level: Not on file  Occupational History    Employer: [employer]  Tobacco Use  . Smoking status: Never Smoker  . Smokeless tobacco: Never Used  Vaping Use  . Vaping Use: Never used  Substance and Sexual Activity  . Alcohol use: Yes    Alcohol/week: 0.0 - 1.0 standard drinks  . Drug use: No  . Sexual activity: Not Currently    Partners: Male    Birth control/protection: Post-menopausal  Other Topics Concern  . Not on file  Social History Narrative  . Not on file   Social Determinants of Health   Financial Resource Strain:   . Difficulty of Paying Living Expenses: Not on file  Food Insecurity:   . Worried About Charity fundraiser in the Last Year: Not on file  . Ran Out of Food in the Last Year: Not on file  Transportation Needs:   . Lack of Transportation (Medical): Not on file  . Lack of Transportation (Non-Medical): Not on file  Physical Activity:   . Days of Exercise per  Week: Not on file  . Minutes of Exercise per Session: Not on file  Stress:   . Feeling of Stress : Not on file  Social Connections:   . Frequency of Communication with Friends and Family: Not on file  . Frequency of Social Gatherings with Friends and Family: Not on file  . Attends Religious Services: Not on file  . Active Member of Clubs or Organizations: Not on file  . Attends Archivist Meetings: Not on file  . Marital Status: Not on file   Vitals:   04/22/20 1412  BP: 120/70  Pulse: 83  Resp: 16  SpO2: 98%   Body mass index is 27.29 kg/m.  Physical Exam Vitals and nursing note reviewed.  Constitutional:      General: She is not in acute distress.    Appearance:  She is well-developed.  HENT:     Head: Normocephalic and atraumatic.  Eyes:     Conjunctiva/sclera: Conjunctivae normal.  Cardiovascular:     Rate and Rhythm: Normal rate and regular rhythm.     Heart sounds: No murmur heard.   Pulmonary:     Effort: Pulmonary effort is normal. No respiratory distress.     Breath sounds: Normal breath sounds.  Abdominal:     Palpations: Abdomen is soft. There is no hepatomegaly or mass.     Tenderness: There is no abdominal tenderness. There is no right CVA tenderness or left CVA tenderness.  Lymphadenopathy:     Cervical: No cervical adenopathy.  Skin:    General: Skin is warm.     Findings: No erythema or rash.  Neurological:     Mental Status: She is alert and oriented to person, place, and time.   ASSESSMENT AND PLAN:  [name] was seen today for discuss bladder stone.  Diagnoses and all orders for this visit:  Nephrolithiasis -     Ambulatory referral to Urology  New problem. We discussed some clinical features of the disease as well as treatment options. She has copy of abdominal US in arabic.. For now we will hold on imaging, I will let the urologist to decide which imaging is better at this time. Adequate hydration. She was clearly instructed about warning signs. Urine strainer given.  Return if symptoms worsen or fail to improve.   Germaine Ripp G. [place], MD  [facility]. [place] office.   A few things to remember from today's visit:   Nephrolithiasis - Plan: Ambulatory referral to Urology  Bladder Stone  A bladder stone is a buildup of crystals made from the proteins and minerals found in urine. These substances build up when urine becomes too concentrated. Urine is concentrated when there is less water and more proteins and minerals in it. Bladder stones usually develop when a person has another medical condition that prevents the bladder from emptying completely. Crystals can form in the small amount of  urine that is left in the bladder. Bladder stones that grow large can become painful and may block the flow of urine. What are the causes? This condition may be caused by:  An enlarged prostate, which prevents the bladder from emptying well.  An infection of a part of your urinary system (urinary tract infection, or UTI). This includes the: ? Kidneys. ? Bladder. ? Ureters. These are the tubes that carry urine to your bladder. ? Urethra. This is the tube that drains urine from your bladder.  A weak spot in the bladder that creates a small pouch (bladder  diverticulum).  Nerve damage that may interfere with the signals from your brain to your bladder muscles (neurogenic bladder). This can result from conditions such as Parkinson's disease or spinal cord injuries. What increases the risk? This condition is more likely to develop in people who:  Get frequent UTIs.  Have another medical condition that affects the bladder.  Have a history of bladder surgery.  Have a spinal cord injury.  Have an abnormal shape of the bladder (deformity). What are the signs or symptoms? Common symptoms of this condition include:  Pain in the abdomen.  A need to urinate more often.  Difficulty or pain when urinating.  Blood in the urine.  Cloudy urine or urine that is dark in color.  Pain in the penis or testicles in men. Small bladder stones do not always cause symptoms. How is this diagnosed? This condition may be diagnosed based on your symptoms, medical history, and physical exam. The physical exam will check for tenderness in your abdomen. For men, an exam in the rectum may be done to check the prostate gland.  You may have tests, such as: ? A urine test (urinalysis). ? A urine sample test to check for other infections (culture). ? Blood tests, including tests to look for a certain substance (creatinine). A creatinine level that is higher than normal could indicate a blockage. ? A  procedure to check your bladder using a scope with a camera (cystoscopy).  You may also have imaging studies, such as: ? CT scan or ultrasound of your abdomen and the area between your hip bones (pelvis or pelvic area). ? An X-ray of your urinary system. How is this treated? This condition may be treated with:  Cystolitholapaxy. This procedure uses a laser, ultrasound, or other device to break the stone into smaller pieces. Fluids are used to flush the small pieces from the area.  Surgery to remove the stone.  A stent. This is a small mesh tube that is threaded into your ureter to make urine flow.  Medicines to treat pain. Follow these instructions at home: Medicines  Take over-the-counter and prescription medicines only as told by your health care provider.  Ask your health care provider if the medicine prescribed to you: ? Requires you to avoid driving or using heavy machinery. ? Can cause constipation. You may need to take these actions to prevent or treat constipation:  Take over-the-counter or prescription medicines.  Eat foods that are high in fiber, such as beans, whole grains, and fresh fruits and vegetables.  Limit foods that are high in fat and processed sugars, such as fried or sweet foods. Alcohol use  Do not drink alcohol if: ? Your health care provider tells you not to drink. ? You are pregnant, may be pregnant, or are planning to become pregnant.  If you drink alcohol: ? Limit how much you drink to:  0-1 drink a day for women.  0-2 drinks a day for men. ? Be aware of how much alcohol is in your drink. In the [place] one drink equals one 12 oz bottle of beer (355 mL), one 5 oz glass of wine (148 mL), or one 1 oz glass of hard liquor (44 mL). Activity  Rest as told by your health care provider.  Return to your normal activities as told by your health care provider. Ask your health care provider what activities are safe for you. General  instructions   Drink enough fluid to keep your urine pale yellow.  Tell your health care provider about any unusual symptoms related to urinating. Early diagnosis of an enlarged prostate and other bladder conditions may reduce your risk of getting bladder stones.  Do not use any products that contain nicotine or tobacco, such as cigarettes, e-cigarettes, or chewing tobacco. If you need help quitting, ask your health care provider.  Do not use drugs. Where to find more information Urology [facility]): www.urologyhealth.org Contact a health care provider if you:  Have a fever.  Feel nauseous or vomit.  Are unable to urinate.  Have a large amount of blood in your urine. Get help right away if you:  Have severe back pain or pain in the lower part of your abdomen.  Cannot eat or drink without vomiting.  Vomit after taking your medicine. Summary  A bladder stone is a buildup of crystals made from the proteins and minerals found in urine. These substances build up when urine becomes too concentrated.  Bladder stones that grow large can become painful and may block the flow of urine.  Bladder stones may be treated with a laser, a stent, surgery, or pain medicines. This information is not intended to replace advice given to you by your health care provider. Make sure you discuss any questions you have with your health care provider. Document Revised: 01/01/2019 Document Reviewed: 01/01/2019 Elsevier Patient Education  [place].  Please be sure medication list is accurate. If a new problem present, please set up appointment sooner than planned today.

## 2020-05-03 ENCOUNTER — Other Ambulatory Visit: Payer: Self-pay

## 2020-05-17 ENCOUNTER — Other Ambulatory Visit: Payer: Self-pay | Admitting: Allergy

## 2020-11-11 ENCOUNTER — Encounter: Payer: Self-pay | Admitting: Obstetrics & Gynecology

## 2020-12-20 NOTE — Progress Notes (Signed)
55 y.o. [id number] Married White or Caucasian female here for annual exam.  Doing well.  Denies vaginal bleeding.    Did a weight loss study this past year.  Is taking Mounjaro with the study.  Because of significant weight loss, she is now taking every 10 days. She's been stable for 5 weeks with weight.    Patient's last menstrual period was 06/25/2010 (approximate).          Sexually active: Yes.    The current method of family planning is post menopausal status.    Exercising: Yes.     2 hours every day Smoker:  no  Health Maintenance: Pap:  12/15/2019 Negative with neg HR HPV History of abnormal Pap:  no MMG:  11/11/2020 negative, abbreviated MRI 2021 Colonoscopy:  cologuard neg 2020 BMD:   not indicated TDaP:  2017 Shingrix:   declines Hep C testing: will order with lab work Screening Labs: every three months with study   reports that she has never smoked. She has never used smokeless tobacco. She reports current alcohol use. She reports that she does not use drugs.  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     Ibuprofen 200 MG CAPS ibuprofen 200 mg capsule  Take 1 capsule every 6 hours by oral route.     clobetasol ointment (TEMOVATE) 0.05 % Apply as directed twice daily for up to 5 days.  Do not use more than 5 days per month. (Patient not taking: Reported on 12/21/2020) 60 g 0   fluticasone furoate-vilanterol (BREO ELLIPTA) 100-25 MCG/INH AEPB Inhale 1 puff into the lungs daily. (Patient not taking: Reported on  12/21/2020) 84 each 1   NONFORMULARY OR COMPOUNDED ITEM topical 2% testosterone propionate 1/4 tsp to skin two times weekly. (Patient not taking: Reported on 12/21/2020) 1 each 0   No current facility-administered medications for this visit.    Family History  Problem Relation Age of Onset   Bone cancer Sister 61       deceased   Breast cancer Sister 29       lives in [place]   Hypertension Brother    Hypertension Brother    Diabetes Son        on oral agents    Review of Systems  All other systems reviewed and are negative.  Exam:   BP 115/69 (BP Location: Right Arm, Patient Position: Sitting, Cuff Size: Small)   Pulse 68   Ht 5' 5.5" (1.664 m)   Wt 136 lb 4 oz (61.8 kg)   LMP 06/25/2010 (Approximate)   BMI 22.33 kg/m   Height: 5' 5.5" (166.4 cm)  General appearance: alert, cooperative and appears stated age Head: Normocephalic, without obvious abnormality, atraumatic Neck: no adenopathy, supple, symmetrical, trachea midline and thyroid normal to inspection and palpation Lungs: clear to auscultation bilaterally Breasts: normal appearance, no masses or tenderness Heart: regular rate and rhythm Abdomen: soft, non-tender; bowel sounds normal; no masses,  no organomegaly Extremities: extremities normal,  atraumatic, no cyanosis or edema Skin: Skin color, texture, turgor normal. No rashes or lesions Lymph nodes: Cervical, supraclavicular, and axillary nodes normal. No abnormal inguinal nodes palpated Neurologic: Grossly normal   Pelvic: External genitalia:  no lesions              Urethra:  normal appearing urethra with no masses, tenderness or lesions              Bartholins and Skenes: normal                 Vagina: normal appearing vagina with normal color and no discharge, no lesions              Cervix: no lesions              Pap taken: No. Bimanual Exam:  Uterus:  normal size, contour, position, consistency, mobility, non-tender              Adnexa: normal  adnexa and no mass, fullness, tenderness               Rectovaginal: Confirms               Anus:  normal sphincter tone, no lesions  Chaperone, Octaviano Batty, CMA, was present for exam.  Assessment/Plan: 1. Well woman exam with routine gynecological exam - Pap with neg HR HPV 2021.  Not indicated today. - MMG 10/2020, abbreviated breast MRI 2021.  Consider again next year. - Cologuard negative 2020 - plan BMD closer to age 89 - lab work obtained today - vaccines updated  2. Postmenopausal - no HRT  3. Family history of breast cancer - Tyrer Cusick model risk calculation is about 14.8% lifetime risk  4. Ductal papilloma breast, unspecified laterality - s/p resection  5. Weight loss - involved in medication trial and under supervision  6. Encounter for hepatitis C screening test for low risk patient - Hepatitis C antibody; Future  7. Blood tests for routine general physical examination - Hemoglobin A1c; Future - Lipid panel; Future - Hepatitis C antibody; Future  8. Elevated lipids - Lipid panel; Future

## 2020-12-21 ENCOUNTER — Encounter (HOSPITAL_BASED_OUTPATIENT_CLINIC_OR_DEPARTMENT_OTHER): Payer: Self-pay | Admitting: Obstetrics & Gynecology

## 2020-12-21 ENCOUNTER — Ambulatory Visit (INDEPENDENT_AMBULATORY_CARE_PROVIDER_SITE_OTHER): Payer: 59 | Admitting: Obstetrics & Gynecology

## 2020-12-21 ENCOUNTER — Other Ambulatory Visit: Payer: Self-pay

## 2020-12-21 VITALS — BP 115/69 | HR 68 | Ht 65.5 in | Wt 136.2 lb

## 2020-12-21 DIAGNOSIS — Z803 Family history of malignant neoplasm of breast: Secondary | ICD-10-CM | POA: Diagnosis not present

## 2020-12-21 DIAGNOSIS — N6019 Diffuse cystic mastopathy of unspecified breast: Secondary | ICD-10-CM

## 2020-12-21 DIAGNOSIS — Z01419 Encounter for gynecological examination (general) (routine) without abnormal findings: Secondary | ICD-10-CM | POA: Diagnosis not present

## 2020-12-21 DIAGNOSIS — Z780 Asymptomatic menopausal state: Secondary | ICD-10-CM | POA: Diagnosis not present

## 2020-12-21 DIAGNOSIS — E785 Hyperlipidemia, unspecified: Secondary | ICD-10-CM

## 2020-12-21 DIAGNOSIS — Z1159 Encounter for screening for other viral diseases: Secondary | ICD-10-CM

## 2020-12-21 DIAGNOSIS — Z0000 Encounter for general adult medical examination without abnormal findings: Secondary | ICD-10-CM

## 2020-12-21 DIAGNOSIS — R634 Abnormal weight loss: Secondary | ICD-10-CM

## 2020-12-21 MED ORDER — CLOBETASOL PROPIONATE 0.05 % EX OINT: TOPICAL_OINTMENT | CUTANEOUS | 0 refills | Status: AC

## 2020-12-22 ENCOUNTER — Ambulatory Visit (HOSPITAL_BASED_OUTPATIENT_CLINIC_OR_DEPARTMENT_OTHER): Payer: 59

## 2020-12-22 ENCOUNTER — Other Ambulatory Visit (HOSPITAL_BASED_OUTPATIENT_CLINIC_OR_DEPARTMENT_OTHER): Payer: Self-pay

## 2020-12-23 DIAGNOSIS — Z803 Family history of malignant neoplasm of breast: Secondary | ICD-10-CM | POA: Insufficient documentation

## 2020-12-23 DIAGNOSIS — Z780 Asymptomatic menopausal state: Secondary | ICD-10-CM | POA: Insufficient documentation

## 2020-12-27 LAB — HEPATITIS C ANTIBODY: Hep C Virus Ab: 0.4 s/co ratio (ref 0.0–0.9)

## 2020-12-29 ENCOUNTER — Encounter (HOSPITAL_BASED_OUTPATIENT_CLINIC_OR_DEPARTMENT_OTHER): Payer: Self-pay

## 2020-12-29 LAB — HGB A1C W/O EAG: Hgb A1c MFr Bld: 5.5 % (ref 4.8–5.6)

## 2020-12-29 LAB — LIPID PANEL W/O CHOL/HDL RATIO
Cholesterol, Total: 217 mg/dL — ABNORMAL HIGH (ref 100–199)
HDL: 55 mg/dL (ref >39)
LDL Chol Calc (NIH): 143 mg/dL — ABNORMAL HIGH (ref 0–99)
Triglycerides: 107 mg/dL (ref 0–149)
VLDL Cholesterol Cal: 19 mg/dL (ref 5–40)

## 2020-12-29 LAB — SPECIMEN STATUS REPORT

## 2020-12-30 NOTE — Telephone Encounter (Signed)
Called and spoke to patient about lab results and recommendations. Patient expressed understanding. tbw

## 2021-02-16 ENCOUNTER — Ambulatory Visit: Payer: 59

## 2021-03-22 ENCOUNTER — Ambulatory Visit: Payer: 59 | Admitting: Family Medicine

## 2021-04-19 ENCOUNTER — Encounter: Payer: Self-pay | Admitting: Family Medicine

## 2021-04-19 ENCOUNTER — Other Ambulatory Visit: Payer: Self-pay

## 2021-04-19 ENCOUNTER — Ambulatory Visit (INDEPENDENT_AMBULATORY_CARE_PROVIDER_SITE_OTHER): Payer: 59 | Admitting: Family Medicine

## 2021-04-19 VITALS — BP 110/70 | HR 67 | Resp 16 | Ht 65.5 in | Wt 134.0 lb

## 2021-04-19 DIAGNOSIS — R233 Spontaneous ecchymoses: Secondary | ICD-10-CM | POA: Diagnosis not present

## 2021-04-19 DIAGNOSIS — E559 Vitamin D deficiency, unspecified: Secondary | ICD-10-CM | POA: Diagnosis not present

## 2021-04-19 DIAGNOSIS — L723 Sebaceous cyst: Secondary | ICD-10-CM | POA: Diagnosis not present

## 2021-04-19 DIAGNOSIS — Z23 Encounter for immunization: Secondary | ICD-10-CM

## 2021-04-19 DIAGNOSIS — H9190 Unspecified hearing loss, unspecified ear: Secondary | ICD-10-CM

## 2021-04-19 DIAGNOSIS — R6889 Other general symptoms and signs: Secondary | ICD-10-CM | POA: Diagnosis not present

## 2021-04-19 NOTE — Progress Notes (Addendum)
Chief Complaint  Patient presents with  . feeling cold   HPI: Ms.Fabiana B Depolo is a 55 y.o. female, who is here today complaining of cold intolerance. This is not a new problem but seems to be getting worse. This year she has felt colder, wearing sweaters during summer. Negative for fatigue,palpitations,changes in bowel habits,sleep changes,skin color changes,edema,or tremor.  She has lost wt, she is eating healthier and exercising regularly daily (walking or pilates) She is participating on a weight loss study and on pharmacologic treatment for obesity, Tirzepatide. She is having blood work done every 3 months, initially every month.Tirzepatide weekly shot, now every 10 days.  -Lesion on back back for a couple of years, growing. It is not tender or pruritic. She also has hyperpigmented areas on her face and would like to see a dermatologist.  -"Sometimes" she has difficulty hearing. This has been going on for year. Trouble hearing on the phone,she had to increase volume. Her husband keeps the TV volume up, she has not noted difference. Negative for tinnitus,earache,or ear drainage. Parents with hx of hearing loss.  -On examination noted petechial confluent rash on anterior neck. No hx of trauma. She denies nose/gum bleeding,melena,blood in stool,or easy bruising. States that she has been grabbing skin of neck frequently. She is not taking aspirin.  Vit D def, she is not on vit D supplementation.  Review of Systems  HENT:  Negative for congestion, mouth sores, rhinorrhea and sore throat.   Respiratory:  Negative for cough, shortness of breath and wheezing.   Cardiovascular:  Negative for chest pain, palpitations and leg swelling.  Gastrointestinal:  Negative for abdominal pain, nausea and vomiting.  Endocrine: Negative for polydipsia, polyphagia and polyuria.  Musculoskeletal:  Negative for gait problem and myalgias.  Skin:  Negative for pallor.  Neurological:  Negative  for syncope, weakness and headaches.  Hematological:  Negative for adenopathy.  Rest see pertinent positives and negatives per HPI.  Current Outpatient Medications on File Prior to Visit  Medication Sig Dispense Refill  . albuterol (VENTOLIN HFA) 108 (90 Base) MCG/ACT inhaler USE 2 PUFFS EVERY 4 HOURS AS NEEDED FOR WHEEZING 1 Inhaler 1  . cetirizine (ZYRTEC) 10 MG tablet Take by mouth.    . clobetasol ointment (TEMOVATE) 0.05 % Apply as directed twice daily for up to 5 days.  Do not use more than 5 days per month. 60 g 0  . fluticasone furoate-vilanterol (BREO ELLIPTA) 100-25 MCG/INH AEPB Inhale 1 puff into the lungs daily. 84 each 1  . Ibuprofen 200 MG CAPS ibuprofen 200 mg capsule  Take 1 capsule every 6 hours by oral route.     No current facility-administered medications on file prior to visit.   Past Medical History:  Diagnosis Date  . ASTHMA 04/02/2007  . Asthma   . 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 Marital status: Married    Spouse name: Not on file  . Number of children: 2  . Years of education: Not on file  . Highest education level: Not on file  Occupational History    Employer: [employer]  Tobacco Use  . Smoking status: Never  . Smokeless tobacco: Never  Vaping Use  . Vaping Use: Never used  Substance and Sexual Activity  . Alcohol use: Yes    Alcohol/week: 0.0 - 1.0 standard drinks  . Drug use: No  . Sexual activity: Not Currently    Partners:  Male    Birth control/protection: Post-menopausal  Other Topics Concern  . Not on file  Social History Narrative  . Not on file   Social Determinants of Health   Financial Resource Strain: Not on file  Food Insecurity: Not on file  Transportation Needs: Not on file  Physical Activity: Not on file  Stress: Not on file  Social Connections: Not on file   Vitals:   04/19/21 1500  BP: 110/70  Pulse: 67  Resp: 16  SpO2: 97%   Wt Readings from Last 3  Encounters:  04/19/21 134 lb (60.8 kg)  12/21/20 136 lb 4 oz (61.8 kg)  04/22/20 164 lb (74.4 kg)   Body mass index is 21.96 kg/m.  Physical Exam Vitals and nursing note reviewed.  Constitutional:      General: She is not in acute distress.    Appearance: She is well-developed.  HENT:     Head: Normocephalic and atraumatic.     Right Ear: Tympanic membrane, ear canal and external ear normal.     Left Ear: Tympanic membrane, ear canal and external ear normal.     Mouth/Throat:     Mouth: Mucous membranes are moist.     Pharynx: Oropharynx is clear.  Eyes:     Conjunctiva/sclera: Conjunctivae normal.  Cardiovascular:     Rate and Rhythm: Normal rate and regular rhythm.     Heart sounds: No murmur heard. Pulmonary:     Effort: Pulmonary effort is normal. No respiratory distress.     Breath sounds: Normal breath sounds.  Abdominal:     Palpations: Abdomen is soft. There is no hepatomegaly or mass.     Tenderness: There is no abdominal tenderness.  Lymphadenopathy:     Cervical: No cervical adenopathy.     Upper Body:     Right upper body: No supraclavicular adenopathy.     Left upper body: No supraclavicular adenopathy.  Skin:    General: Skin is warm.     Findings: Lesion and petechiae present. No erythema or rash.       Neurological:     General: No focal deficit present.     Mental Status: She is alert and oriented to person, place, and time.     Cranial Nerves: No cranial nerve deficit.     Gait: Gait normal.  Psychiatric:     Comments: Well groomed, good eye contact.   ASSESSMENT AND PLAN:  Ms.Nasia was seen today for feeling cold.  Diagnoses and all orders for this visit: Orders Placed This Encounter  Procedures  . Flu Vaccine QUAD 58mo+IM (Fluarix, Fluzone & Alfiuria Quad PF)  . TSH  . VITAMIN D 25 Hydroxy (Vit-D Deficiency, Fractures)  . CBC with Differential/Platelet  . Ambulatory referral to Dermatology   Lab Results  Component Value Date   WBC  6.1 04/19/2021   HGB 14.4 04/19/2021   HCT 42.6 04/19/2021   MCV 85.0 04/19/2021   PLT 274.0 04/19/2021   Lab Results  Component Value Date   TSH 1.46 04/19/2021   Cold intolerance Hx and examination do not suggest a serious process. Could be related to wt loss , not common side effect of GLP-1. She has had blood work done monthly and now q 3 months. Further recommendations according to lab results.  Sebaceous cyst Reassured. Discussed Dx,prognosis,and treatment. Dermatology referral placed.  Petechial rash We discussed possible etiologies. Localized, so could be caused by local irritation. Monitor for new symptoms. Instructed about warning signs.  Vitamin  D deficiency, unspecified She is not on vit D supplementation. Further recommendations according to 25 OH vit D.  Hearing loss, unspecified hearing loss type, unspecified laterality Problem is not constant.Marland Kitchen Hearing test in office normal.  Hearing Screening   500Hz  1000Hz  2000Hz  4000Hz   Right ear Pass Pass Pass Pass  Left ear Pass Pass Pass Pass   Need for influenza vaccination -     Flu Vaccine QUAD 61mo+IM (Fluarix, Fluzone & Alfiuria Quad PF)  Return if symptoms worsen or fail to improve.   Soraida Vickers G. [place], MD  [facility]. [place] office.

## 2021-04-19 NOTE — Patient Instructions (Addendum)
A few things to remember from today's visit:   Sebaceous cyst - Plan: Ambulatory referral to Dermatology  Petechial rash - Plan: CBC with Differential/Platelet  Cold intolerance - Plan: TSH  Vitamin D deficiency, unspecified - Plan: VITAMIN D 25 Hydroxy (Vit-D Deficiency, Fractures)  Hearing loss, unspecified hearing loss type, unspecified laterality  If you need refills please call your pharmacy. Do not use My Chart to request refills or for acute issues that need immediate attention.   Hearing test normal. Appt with dermatologist will be arranged. Monitor for worsening rash.   Please be sure medication list is accurate. If a new problem present, please set up appointment sooner than planned today.

## 2021-04-20 ENCOUNTER — Encounter: Payer: Self-pay | Admitting: Family Medicine

## 2021-04-20 LAB — CBC WITH DIFFERENTIAL/PLATELET
Basophils Absolute: 0 10*3/uL (ref 0.0–0.1)
Basophils Relative: 0.6 % (ref 0.0–3.0)
Eosinophils Absolute: 0 10*3/uL (ref 0.0–0.7)
Eosinophils Relative: 0.7 % (ref 0.0–5.0)
HCT: 42.6 % (ref 36.0–46.0)
Hemoglobin: 14.4 g/dL (ref 12.0–15.0)
Lymphocytes Relative: 39.1 % (ref 12.0–46.0)
Lymphs Abs: 2.4 10*3/uL (ref 0.7–4.0)
MCHC: 33.8 g/dL (ref 30.0–36.0)
MCV: 85 fl (ref 78.0–100.0)
Monocytes Absolute: 0.3 10*3/uL (ref 0.1–1.0)
Monocytes Relative: 5 % (ref 3.0–12.0)
Neutro Abs: 3.3 10*3/uL (ref 1.4–7.7)
Neutrophils Relative %: 54.6 % (ref 43.0–77.0)
Platelets: 274 10*3/uL (ref 150.0–400.0)
RBC: 5.01 Mil/uL (ref 3.87–5.11)
RDW: 12.9 % (ref 11.5–15.5)
WBC: 6.1 10*3/uL (ref 4.0–10.5)

## 2021-04-20 LAB — TSH: TSH: 1.46 u[IU]/mL (ref 0.35–5.50)

## 2021-04-20 LAB — VITAMIN D 25 HYDROXY (VIT D DEFICIENCY, FRACTURES): VITD: 23.46 ng/mL — ABNORMAL LOW (ref 30.00–100.00)

## 2021-04-26 NOTE — Telephone Encounter (Signed)
See result note.  

## 2021-09-17 IMAGING — MR MR BREAST WO/W CM  BILAT
5 series · 30 of 48 positions shown · IV contrast (gadavist)
Comparison: 11/19/2019, 11/10/2019

CLINICAL DATA: Abbreviated Breast MRI for breast cancer screening.
Breast cancer diagnosed in the patient's sister. 14.8% lifetime risk
for breast cancer.

LABS:  None obtained at the time of imaging.
EXAM:
BILATERAL ABBREVIATED BREAST MRI WITH AND WITHOUT CONTRAST
TECHNIQUE: Multiplanar, multisequence MR images of both breasts were obtained
prior to and following the intravenous administration of 8 ml of
Gadavist

[Series 3: t2_tirm_tra ipat (a-p) · axial · 3.0mm · 0.70mm/px · z∈[-31,+131]mm · 5 of 55 slices shown]
[im 1/55]
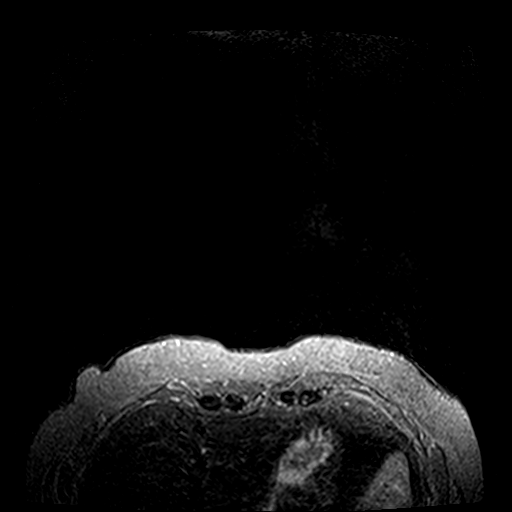
[im 14/55]
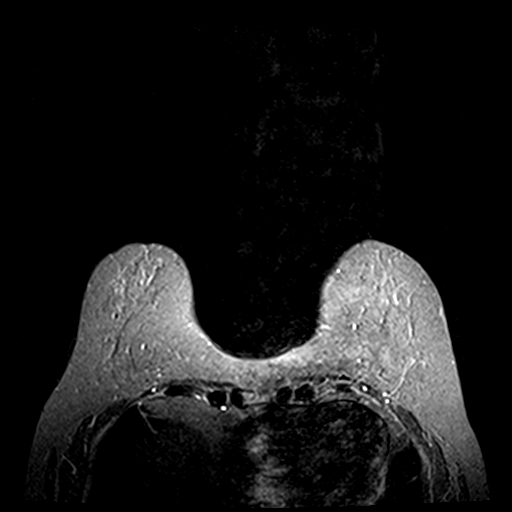
[im 28/55]
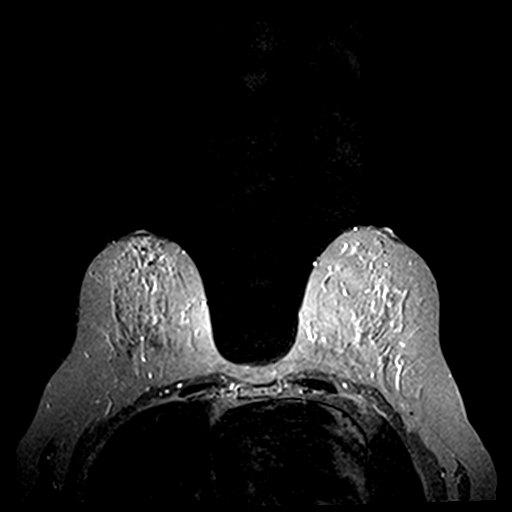
[im 41/55]
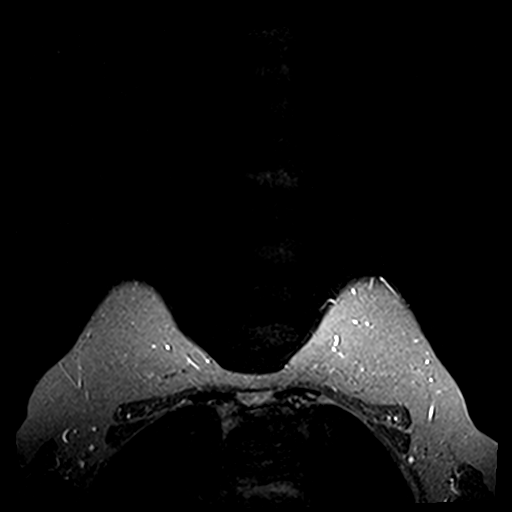
[im 55/55]
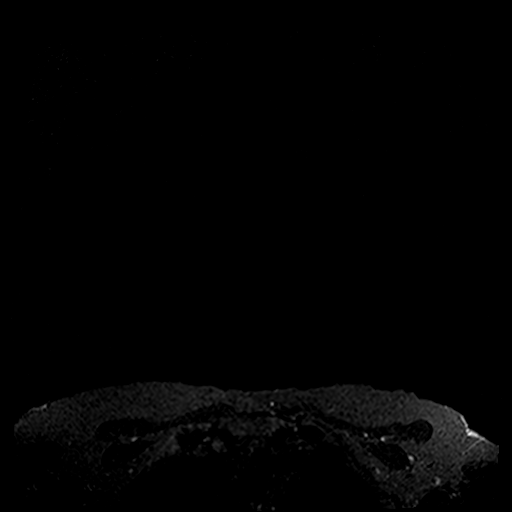

[Series 4: fl3d pre-cm · axial · non-contrast · 1.2mm · 0.94mm/px · z∈[-36,+136]mm · 8 of 144 slices shown]
[im 1/144]
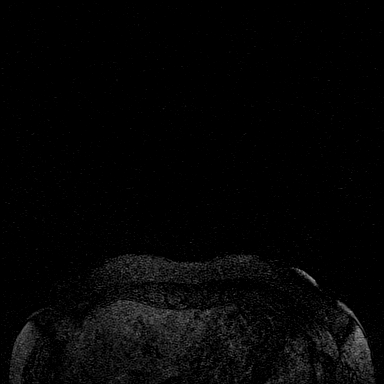
[im 23/144]
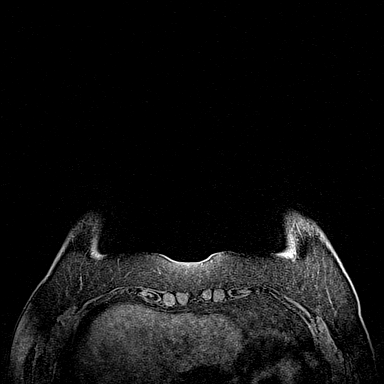
[im 45/144]
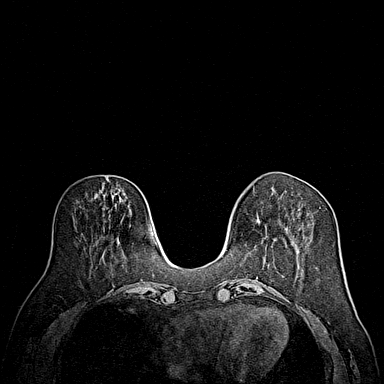
[im 67/144]
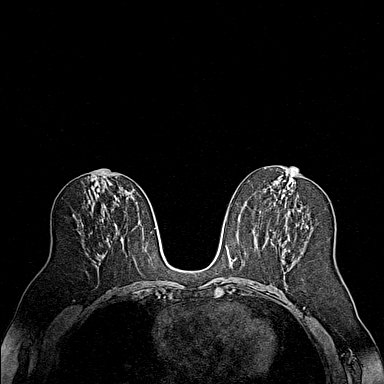
[im 78/144]
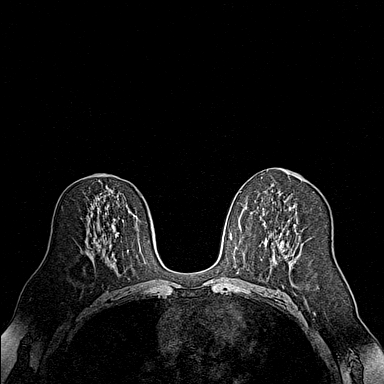
[im 100/144]
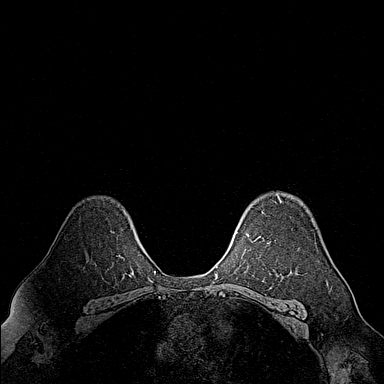
[im 122/144]
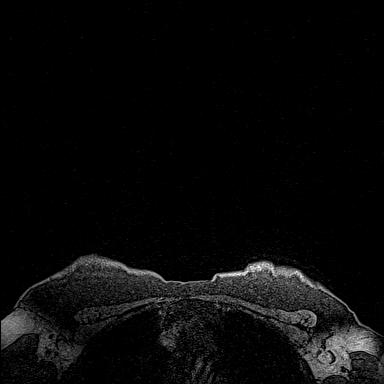
[im 144/144]
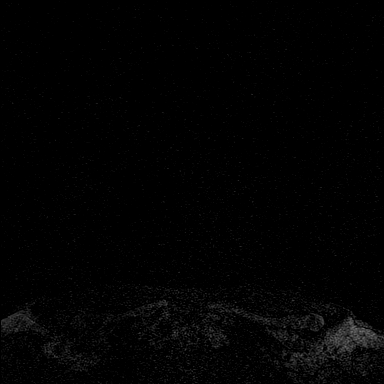

[Series 5: fl3d post-cm 20 · axial · 1.2mm · 0.94mm/px · z∈[-36,+136]mm · 8 of 144 slices shown (1 of 3)]
[im 1/144]
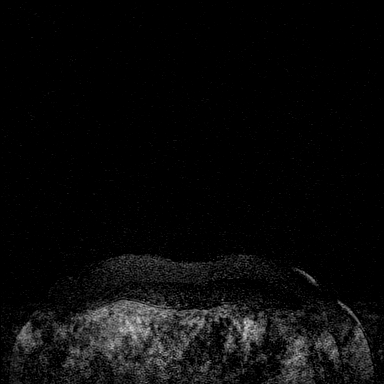
[im 23/144]
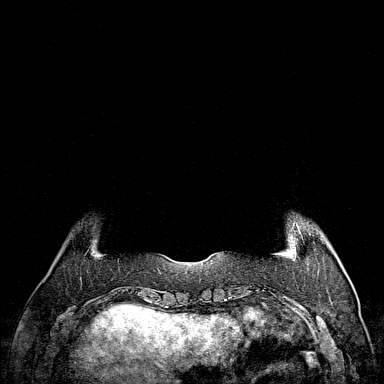
[im 45/144]
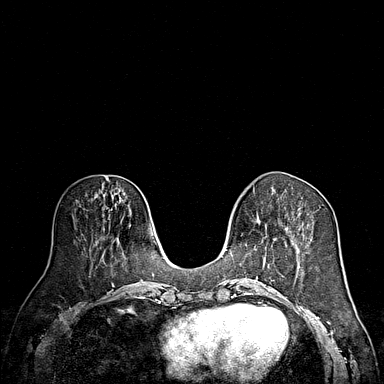
[im 67/144]
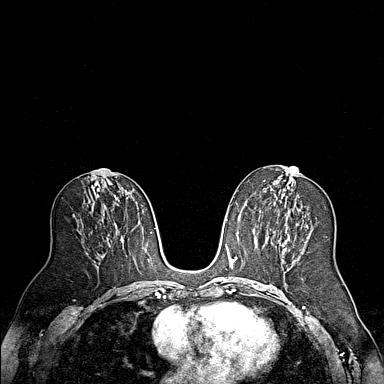
[im 78/144]
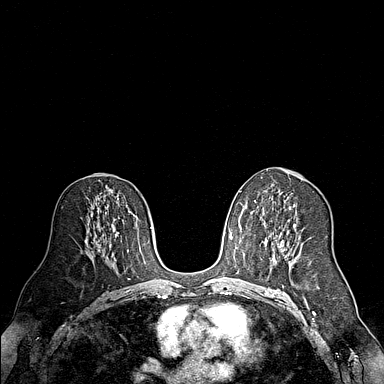
[im 100/144]
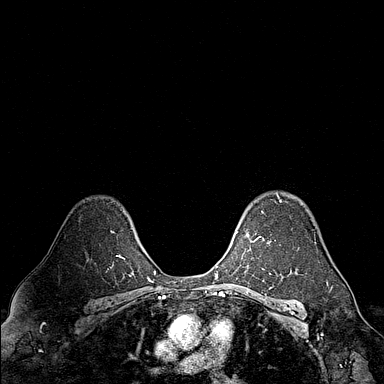
[im 122/144]
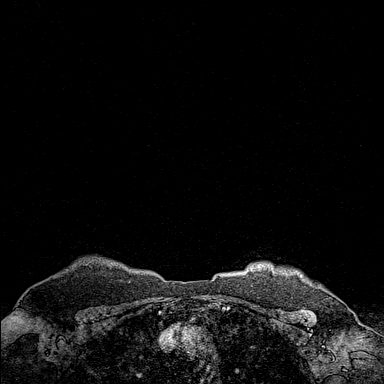
[im 144/144]
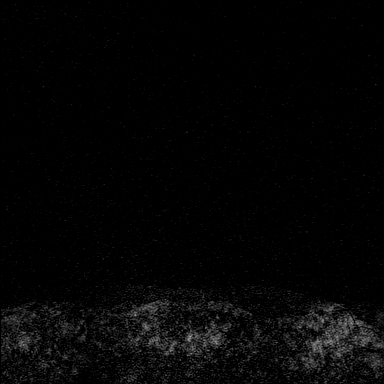

[Series 6: fl3d post-cm 20 · axial · 1.2mm · 0.94mm/px · z∈[-36,+136]mm · 8 of 144 slices shown (2 of 3)]
[im 1/144]
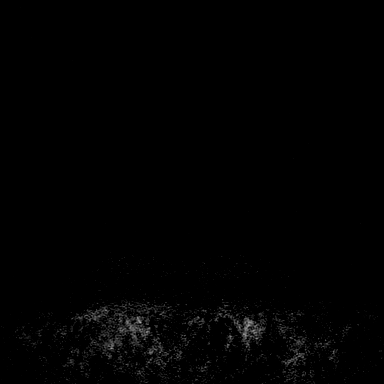
[im 23/144]
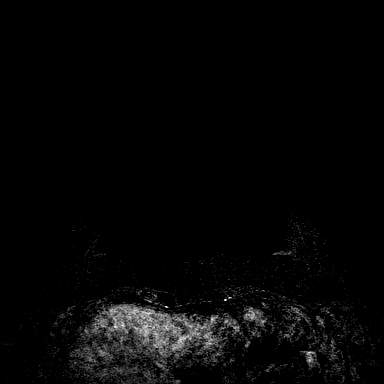
[im 45/144]
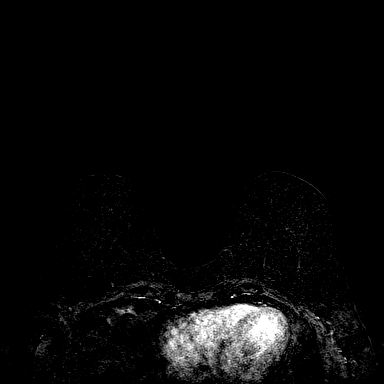
[im 67/144]
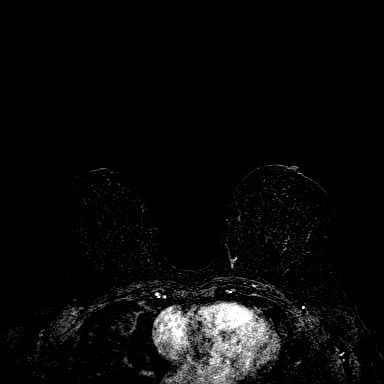
[im 78/144]
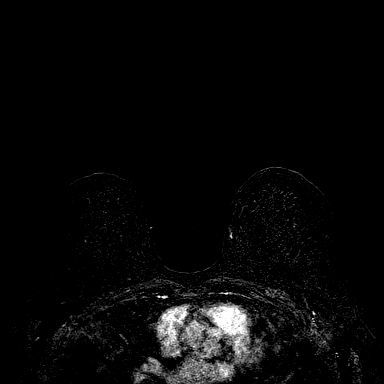
[im 100/144]
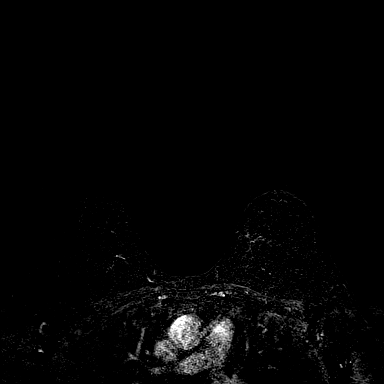
[im 122/144]
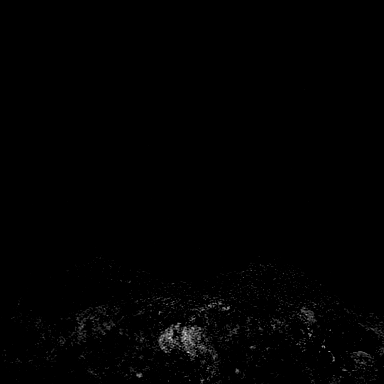
[im 144/144]
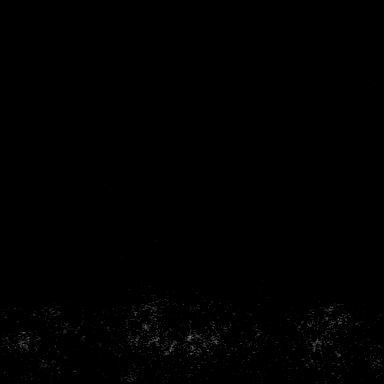

[Series 7: fl3d post-cm 20 · axial · 172.8mm · 0.94mm/px · 1 of 1 slices shown (3 of 3)]
[im 1/1]
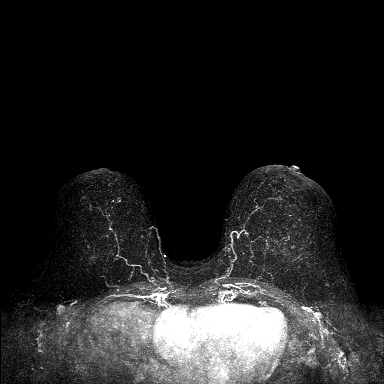

[30 of 48 positions shown; findings below may reference images not displayed]

Three-dimensional MR images were rendered by post-processing of the
original MR data on an independent workstation. The
three-dimensional MR images were interpreted, and findings are
reported in the following complete MRI report for this study. Three
dimensional images were evaluated at the independent [employer]
workstation
FINDINGS: Breast composition: b. Scattered fibroglandular tissue.

Background parenchymal enhancement: Minimal

Right breast: No mass or abnormal enhancement.

Left breast: No mass or abnormal enhancement.

Lymph nodes: No abnormal appearing lymph nodes.

Ancillary findings:  None.
IMPRESSION: No MRI evidence for malignancy in either breast.

RECOMMENDATION:
Recommend annual screening mammography. The patient is also eligible
for annual Abbreviated Breast MRI if she wishes

BI-RADS CATEGORY  1: Negative.

## 2021-09-24 ENCOUNTER — Other Ambulatory Visit: Payer: Self-pay

## 2021-09-24 ENCOUNTER — Emergency Department (HOSPITAL_BASED_OUTPATIENT_CLINIC_OR_DEPARTMENT_OTHER)
Admission: EM | Admit: 2021-09-24 | Discharge: 2021-09-25 | Disposition: A | Discharge status: Home / Self Care | Payer: 59 | Attending: Emergency Medicine | Admitting: Emergency Medicine

## 2021-09-24 ENCOUNTER — Encounter (HOSPITAL_BASED_OUTPATIENT_CLINIC_OR_DEPARTMENT_OTHER): Payer: Self-pay

## 2021-09-24 DIAGNOSIS — R112 Nausea with vomiting, unspecified: Secondary | ICD-10-CM | POA: Diagnosis present

## 2021-09-24 DIAGNOSIS — Z7951 Long term (current) use of inhaled steroids: Secondary | ICD-10-CM | POA: Diagnosis not present

## 2021-09-24 DIAGNOSIS — R109 Unspecified abdominal pain: Secondary | ICD-10-CM | POA: Insufficient documentation

## 2021-09-24 DIAGNOSIS — E860 Dehydration: Secondary | ICD-10-CM | POA: Diagnosis not present

## 2021-09-24 DIAGNOSIS — R197 Diarrhea, unspecified: Secondary | ICD-10-CM | POA: Diagnosis not present

## 2021-09-24 DIAGNOSIS — J45909 Unspecified asthma, uncomplicated: Secondary | ICD-10-CM | POA: Diagnosis not present

## 2021-09-24 DIAGNOSIS — R000 Tachycardia, unspecified: Secondary | ICD-10-CM | POA: Insufficient documentation

## 2021-09-24 LAB — CBC
HCT: 49.6 % — ABNORMAL HIGH (ref 36.0–46.0)
Hemoglobin: 16.4 g/dL — ABNORMAL HIGH (ref 12.0–15.0)
MCH: 28.3 pg (ref 26.0–34.0)
MCHC: 33.1 g/dL (ref 30.0–36.0)
MCV: 85.5 fL (ref 80.0–100.0)
Platelets: 344 10*3/uL (ref 150–400)
RBC: 5.8 MIL/uL — ABNORMAL HIGH (ref 3.87–5.11)
RDW: 12.1 % (ref 11.5–15.5)
WBC: 10.9 10*3/uL — ABNORMAL HIGH (ref 4.0–10.5)
nRBC: 0 % (ref 0.0–0.2)

## 2021-09-24 LAB — URINALYSIS, ROUTINE W REFLEX MICROSCOPIC
Bilirubin Urine: NEGATIVE
Glucose, UA: NEGATIVE mg/dL
Ketones, ur: 15 mg/dL — AB
Leukocytes,Ua: NEGATIVE
Nitrite: NEGATIVE
Protein, ur: 30 mg/dL — AB
Specific Gravity, Urine: 1.034 — ABNORMAL HIGH (ref 1.005–1.030)
pH: 6 (ref 5.0–8.0)

## 2021-09-24 LAB — COMPREHENSIVE METABOLIC PANEL
ALT: 33 U/L (ref 0–44)
AST: 24 U/L (ref 15–41)
Albumin: 5 g/dL (ref 3.5–5.0)
Alkaline Phosphatase: 70 U/L (ref 38–126)
Anion gap: 11 (ref 5–15)
BUN: 23 mg/dL — ABNORMAL HIGH (ref 6–20)
CO2: 24 mmol/L (ref 22–32)
Calcium: 11 mg/dL — ABNORMAL HIGH (ref 8.9–10.3)
Chloride: 104 mmol/L (ref 98–111)
Creatinine, Ser: 1.04 mg/dL — ABNORMAL HIGH (ref 0.44–1.00)
GFR, Estimated: >60 mL/min (ref >60)
Glucose, Bld: 92 mg/dL (ref 70–99)
Potassium: 4.1 mmol/L (ref 3.5–5.1)
Sodium: 139 mmol/L (ref 135–145)
Total Bilirubin: 0.7 mg/dL (ref 0.3–1.2)
Total Protein: 8.5 g/dL — ABNORMAL HIGH (ref 6.5–8.1)

## 2021-09-24 LAB — PREGNANCY, URINE: Preg Test, Ur: NEGATIVE

## 2021-09-24 LAB — LIPASE, BLOOD: Lipase: 22 U/L (ref 11–51)

## 2021-09-24 MED ORDER — ACETAMINOPHEN 325 MG PO TABS
650.0000 mg | ORAL_TABLET | Freq: Once | ORAL | Status: AC
  Administered 2021-09-24: 650 mg via ORAL
  Filled 2021-09-24: qty 2

## 2021-09-24 MED ORDER — ONDANSETRON HCL 4 MG/2ML IJ SOLN
4.0000 mg | Freq: Once | INTRAMUSCULAR | Status: AC
  Administered 2021-09-24: 4 mg via INTRAVENOUS
  Filled 2021-09-24: qty 2

## 2021-09-24 MED ORDER — ONDANSETRON HCL 4 MG/2ML IJ SOLN
4.0000 mg | Freq: Once | INTRAMUSCULAR | Status: AC | PRN
  Administered 2021-09-24: 4 mg via INTRAVENOUS
  Filled 2021-09-24: qty 2

## 2021-09-24 MED ORDER — SODIUM CHLORIDE 0.9 % IV BOLUS
1000.0000 mL | Freq: Once | INTRAVENOUS | Status: AC
  Administered 2021-09-24: 1000 mL via INTRAVENOUS

## 2021-09-24 NOTE — ED Triage Notes (Signed)
Patient here POV from Home with Emesis. ? ?Endorses waking ABD Pain and Constipation and then began to have Constant Nausea and Emesis throughout the Day. ? ?Now Endorses Diarrhea as well. No Fevers.  ? ?Actively Vomiting in Triage. A&Ox4. GCS 15. BIB Wheelchair. ?

## 2021-09-24 NOTE — ED Notes (Signed)
No further emesis noted; no further c/o diahhrea ?

## 2021-09-25 MED ORDER — ONDANSETRON 4 MG PO TBDP: 4.0000 mg | ORAL_TABLET | Freq: Three times a day (TID) | ORAL | 0 refills | Status: DC | PRN

## 2021-09-25 NOTE — ED Provider Notes (Signed)
?[facility] ?Provider Note ? ? ?CSN: [id number] ?Arrival date & time: 09/24/21  2101 ? ?  ? ?History ? ?Chief Complaint  ?Patient presents with  ? Emesis  ? ? ?Kathleen Farley is a 56 y.o. female. ? ?HPI ? ?  ? ?This is a 56 year old female who presents with nausea, vomiting, diarrhea.  Patient reports 1 day history of these.  States that she does not take anything at home.  She reports some crampy abdominal pain that comes and goes.  Prior to this she had issues with constipation but did not take anything.  No known sick contacts or exposures.  Denies any fevers. ? ?Home Medications ?Prior to Admission medications   ?Medication Sig Start Date End Date Taking? Authorizing Provider  ?ondansetron (ZOFRAN-ODT) 4 MG disintegrating tablet Take 1 tablet (4 mg total) by mouth every 8 (eight) hours as needed for nausea or vomiting. 09/25/21  Yes Callahan Peddie, Barbette Hair, MD  ?albuterol (VENTOLIN HFA) 108 (90 Base) MCG/ACT inhaler USE 2 PUFFS EVERY 4 HOURS AS NEEDED FOR WHEEZING 11/03/18   Garnet Sierras, DO  ?cetirizine (ZYRTEC) 10 MG tablet Take by mouth.    [provider]  ?clobetasol ointment (TEMOVATE) 0.05 % Apply as directed twice daily for up to 5 days.  Do not use more than 5 days per month. 12/21/20   Megan Salon, MD  ?fluticasone furoate-vilanterol (BREO ELLIPTA) 100-25 MCG/INH AEPB Inhale 1 puff into the lungs daily. 02/05/19   Garnet Sierras, DO  ?Ibuprofen 200 MG CAPS ibuprofen 200 mg capsule ? Take 1 capsule every 6 hours by oral route.    [provider]  ?   ? ?Allergies    ?Patient has no known allergies.   ? ?Review of Systems   ?Review of Systems  ?Constitutional:  Negative for fever.  ?Gastrointestinal:  Positive for abdominal pain, diarrhea, nausea and vomiting.  ?All other systems reviewed and are negative. ? ?Physical Exam ?Updated Vital Signs ?BP 115/70   Pulse 77   Temp 97.8 ?F (36.6 ?C)   Resp 18   Ht 1.664 m (5' 5.5")   Wt 60.8 kg   LMP 06/25/2010  (Approximate)   SpO2 96%   BMI 21.97 kg/m?  ?Physical Exam ?Vitals and nursing note reviewed.  ?Constitutional:   ?   Appearance: She is well-developed. She is not ill-appearing.  ?HENT:  ?   Head: Normocephalic and atraumatic.  ?   Mouth/Throat:  ?   Mouth: Mucous membranes are dry.  ?Eyes:  ?   Pupils: Pupils are equal, round, and reactive to light.  ?Cardiovascular:  ?   Rate and Rhythm: Regular rhythm. Tachycardia present.  ?   Heart sounds: Normal heart sounds.  ?Pulmonary:  ?   Effort: Pulmonary effort is normal. No respiratory distress.  ?   Breath sounds: No wheezing.  ?Abdominal:  ?   Palpations: Abdomen is soft.  ?   Tenderness: There is no abdominal tenderness. There is no guarding or rebound.  ?   Comments: Increased bowel sound  ?Musculoskeletal:  ?   Cervical back: Neck supple.  ?Skin: ?   General: Skin is warm and dry.  ?Neurological:  ?   Mental Status: She is alert and oriented to person, place, and time.  ?Psychiatric:     ?   Mood and Affect: Mood normal.  ? ? ?ED Results / Procedures / Treatments   ?Labs ?(all labs ordered are listed, but only abnormal results are  displayed) ?Labs Reviewed  ?COMPREHENSIVE METABOLIC PANEL - Abnormal; Notable for the following components:  ?    Result Value  ? BUN 23 (*)   ? Creatinine, Ser 1.04 (*)   ? Calcium 11.0 (*)   ? Total Protein 8.5 (*)   ? All other components within normal limits  ?CBC - Abnormal; Notable for the following components:  ? WBC 10.9 (*)   ? RBC 5.80 (*)   ? Hemoglobin 16.4 (*)   ? HCT 49.6 (*)   ? All other components within normal limits  ?URINALYSIS, ROUTINE W REFLEX MICROSCOPIC - Abnormal; Notable for the following components:  ? Specific Gravity, Urine 1.034 (*)   ? Hgb urine dipstick TRACE (*)   ? Ketones, ur 15 (*)   ? Protein, ur 30 (*)   ? Bacteria, UA RARE (*)   ? All other components within normal limits  ?LIPASE, BLOOD  ?PREGNANCY, URINE  ? ? ?EKG ?None ? ?Radiology ?No results found. ? ?Procedures ?Procedures  ? ? ?Medications  Ordered in ED ?Medications  ?ondansetron (ZOFRAN) injection 4 mg (4 mg Intravenous Given 09/24/21 2143)  ?sodium chloride 0.9 % bolus 1,000 mL (0 mLs Intravenous Stopped 09/25/21 0020)  ?ondansetron [facility]) injection 4 mg (4 mg Intravenous Given 09/24/21 2319)  ?acetaminophen (TYLENOL) tablet 650 mg (650 mg Oral Given 09/24/21 2334)  ? ? ?ED Course/ Medical Decision Making/ A&P ?  ?                        ?Medical Decision Making ?Amount and/or Complexity of Data Reviewed ?Labs: ordered. ? ?Risk ?OTC drugs. ?Prescription drug management. ? ? ?This patient presents to the ED for concern of nausea, vomiting, diarrhea, this involves an extensive number of treatment options, and is a complaint that carries with it a high risk of complications and morbidity.  The differential diagnosis includes gastroenteritis, colitis, SBO ? ?MDM:   ? ?Patient presents with nausea, vomiting, diarrhea x1 day.  Clinically dry appearing but nontoxic.  She has increased bowel sounds but her abdomen is otherwise nontender.  Patient was given fluids and Zofran.  Labs reviewed.  Appear hemoconcentrated.  No significant leukocytosis, metabolic derangement, or elevated lipase.  Highly suspect viral etiology.  On recheck, she is now tolerating fluids and feels better.  Discussed supportive measures at home.  Considered advanced imaging but do not feel this is necessary given benign abdominal exam and improvement. ?(Labs, imaging) ? ?Labs: ?I Ordered, and personally interpreted labs.  The pertinent results include: CBC, BMP, lipase ? ?Imaging Studies ordered: ?I ordered imaging studies including none ?I independently visualized and interpreted imaging. ?I agree with the radiologist interpretation ? ?Additional history obtained from husband.  External records from outside source obtained and reviewed including prior visits ? ?Critical Interventions: ?IV fluids and Zofran ? ?Consultations: ?I requested consultation with the NA,  and discussed lab and  imaging findings as well as pertinent plan - they recommend: N/A ? ?Cardiac Monitoring: ?The patient was maintained on a cardiac monitor.  I personally viewed and interpreted the cardiac monitored which showed an underlying rhythm of: Normal sinus rhythm ? ?Reevaluation: ?After the interventions noted above, I reevaluated the patient and found that they have :improved ? ? ?Considered admission for: Intractable nausea and vomiting ? ?Social Determinants of Health: ?Lives independent ? ?Disposition: Discharge ? ?Co morbidities that complicate the patient evaluation ? ?Past Medical History:  ?Diagnosis Date  ? ASTHMA 04/02/2007  ? Asthma   ?  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  ? ?Medicines ?Meds ordered this encounter  ?Medications  ? ondansetron (ZOFRAN) injection 4 mg  ? sodium chloride 0.9 % bolus 1,000 mL  ? ondansetron (ZOFRAN) injection 4 mg  ? acetaminophen (TYLENOL) tablet 650 mg  ? ondansetron (ZOFRAN-ODT) 4 MG disintegrating tablet  ?  Sig: Take 1 tablet (4 mg total) by mouth every 8 (eight) hours as needed for nausea or vomiting.  ?  Dispense:  20 tablet  ?  Refill:  0  ?  ?I have reviewed the patients home medicines and have made adjustments as needed ? ?Problem List / ED Course: ?Problem List Items Addressed This Visit   ?None ?Visit Diagnoses   ? ? Nausea vomiting and diarrhea    -  Primary  ? Dehydration      ? ?  ?  ? ? ? ? ? ? ? ? ? ? ? ? ?Final Clinical Impression(s) / ED Diagnoses ?Final diagnoses:  ?Nausea vomiting and diarrhea  ?Dehydration  ? ? ?Rx / DC Orders ?ED Discharge Orders   ? ?      Ordered  ?  ondansetron (ZOFRAN-ODT) 4 MG disintegrating tablet  Every 8 hours PRN       ? 09/25/21 0054  ? ?  ?  ? ?  ? ? ?  ?Merryl Hacker, MD ?09/25/21 [phone] ? ?

## 2021-09-25 NOTE — Discharge Instructions (Addendum)
Seen today for nausea, vomiting, and diarrhea.  This is likely viral in nature.  Make sure that you are staying hydrated at home.  Take Zofran as needed.  You may also take Imodium over-the-counter for diarrhea. ?

## 2021-10-12 ENCOUNTER — Telehealth (INDEPENDENT_AMBULATORY_CARE_PROVIDER_SITE_OTHER): Payer: 59 | Admitting: Internal Medicine

## 2021-10-12 ENCOUNTER — Encounter: Payer: Self-pay | Admitting: Internal Medicine

## 2021-10-12 DIAGNOSIS — H019 Unspecified inflammation of eyelid: Secondary | ICD-10-CM

## 2021-10-12 MED ORDER — DOXYCYCLINE HYCLATE 100 MG PO TABS: 100.0000 mg | ORAL_TABLET | Freq: Two times a day (BID) | ORAL | 0 refills | Status: DC

## 2021-10-12 MED ORDER — ERYTHROMYCIN 5 MG/GM OP OINT: TOPICAL_OINTMENT | OPHTHALMIC | 0 refills | Status: DC

## 2021-10-12 NOTE — Progress Notes (Signed)
?Virtual Visit via Video Note ? ?I connected with Kathleen Farley on 10/12/21 at 10:00 AM EDT by a video enabled telemedicine application and verified that I am speaking with the correct person using two identifiers. ?Location patient: home ?Location provider: home office ?Persons participating in the virtual visit: patient, provider ? ?[place] national recommendations  regarding COVID 19 pandemic   video visit is advised over in office visit for this patient.  ?Patient aware  of the limitations of evaluation and management by telemedicine and  availability of in person appointments. and agreed to proceed. ? ? ?HPI: ?Kathleen Farley presents for video visit because of symptoms around her eye.  She was in her usual state of good health and yesterday had some swelling tenderness in the right lower lateral eyelid that felt like perhaps a stye coming on.  No fever or discharge vision changes however the this morning she had noted some discoloration around the inferior eye and a numb feeling on the right cheekbone.  No associated fever teeth pain serious congestion although couple days ago she was able to take Claritin for sneezing runny watery symptoms. ? ?She needs to travel to [place] tomorrow and asked for advice. ? ?She has had a history of styes that resolved with over-the-counter's and local care. ? ?On no regular medicines. ? ?No weakness speech difficulties headache. ? ?ROS: See pertinent positives and negatives per HPI. ? ?Past Medical History:  ?Diagnosis Date  ? ASTHMA 04/02/2007  ? Asthma   ? 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 ? ?Past Surgical History:  ?Procedure Laterality Date  ? BREAST LUMPECTOMY Right 1994  ? breast papilloma excsision  07/2007  ? SALPINGOOPHORECTOMY Right 2004  ? endometriosis  ? ? ?Family History  ?Problem Relation Age of Onset  ? Bone cancer Sister 18  ?     deceased  ? Breast cancer Sister 22  ?     lives in [place]  ? Hypertension Brother   ? Hypertension Brother   ? Diabetes Son   ?     on  oral agents  ? ? ?Social History  ? ?Tobacco Use  ? Smoking status: Never  ? Smokeless tobacco: Never  ?Vaping Use  ? Vaping Use: Never used  ?Substance Use Topics  ? Alcohol use: Not Currently  ?  Alcohol/week: 0.0 - 1.0 standard drinks  ? Drug use: No  ? ? ? ? ?Current Outpatient Medications:  ?  doxycycline (VIBRA-TABS) 100 MG tablet, Take 1 tablet (100 mg total) by mouth 2 (two) times daily. If needed for eyelid sinus infection, Disp: 14 tablet, Rfl: 0 ?  erythromycin ophthalmic ointment, Apply to right  lower eye lid   3 x per day after compresses, Disp: 3.5 g, Rfl: 0 ?  albuterol (VENTOLIN HFA) 108 (90 Base) MCG/ACT inhaler, USE 2 PUFFS EVERY 4 HOURS AS NEEDED FOR WHEEZING, Disp: 1 Inhaler, Rfl: 1 ?  cetirizine (ZYRTEC) 10 MG tablet, Take by mouth., Disp: , Rfl:  ?  clobetasol ointment (TEMOVATE) 0.05 %, Apply as directed twice daily for up to 5 days.  Do not use more than 5 days per month., Disp: 60 g, Rfl: 0 ?  fluticasone furoate-vilanterol (BREO ELLIPTA) 100-25 MCG/INH AEPB, Inhale 1 puff into the lungs daily., Disp: 84 each, Rfl: 1 ?  Ibuprofen 200 MG CAPS, ibuprofen 200 mg capsule  Take 1 capsule every 6 hours by oral route., Disp: , Rfl:  ?  ondansetron (ZOFRAN-ODT) 4 MG disintegrating tablet, Take  1 tablet (4 mg total) by mouth every 8 (eight) hours as needed for nausea or vomiting., Disp: 20 tablet, Rfl: 0 ? ?EXAM: ?BP Readings from Last 3 Encounters:  ?09/25/21 115/70  ?04/19/21 110/70  ?12/21/20 115/69  ? ? ?VITALS per patient if applicable: ? ?GENERAL: alert, oriented, appears well and in no acute distress ? ?HEENT: atraumatic, conjunttiva clear, there is some puffiness to the inferior lateral right lower eyelid without conjunctival bulbar involvement EOMs appear full no focal tenderness otherwise but does note infra orbital question discoloration that is only faintly obvious. ? ?Looks well otherwise.  No obvious congestion facial movements appear normal and symmetrical. ?NECK: normal movements of  the head and neck ? ?LUNGS: on inspection no signs of respiratory distress, breathing rate appears normal, no obvious gross SOB, gasping or wheezing ? ?CV: no obvious cyanosis ? ?MS: moves all visible extremities without noticeable abnormality ? ?PSYCH/NEURO: pleasant and cooperative, no obvious depression or anxiety, speech and thought processing grossly intact ? ? ?ASSESSMENT AND PLAN: ? ?Discussed the following assessment and plan: ? ?  ICD-10-CM   ?1. Infected eye lid ? swelling  style vs other  H01.9   ? see text   ?  ?Curious presentation  ?Cannot explain the numb feeling in upper maxilla area  no neuro findings  ? If sinus related  has underlying allergy but  calm now after Claritin.  ?Empiric  rx like style or eye lid infection .   Consider add oral antibiotic if progressing .  Doxy sent in to use if needed in addition tot erytho eye ointment ?Traveling tomorrow  ?Counseled.  ? Expectant management and discussion of plan and treatment with opportunity to ask questions and all were answered. The patient agreed with the plan and demonstrated an understanding of the instructions. ?  ?Advised to call back or seek an in-person evaluation if worsening  or having  further concerns  in interim. ?Return if symptoms worsen or fail to improve. ? ? ? ?Shanon Ace, MD  ?

## 2021-10-18 NOTE — Progress Notes (Signed)
? ? ?ACUTE VISIT ?Chief Complaint  ?Patient presents with  ? maintain weight  ? Constipation  ? ?HPI: ?Kathleen Farley is a 56 y.o. female, who is here today with above complaints. ?She was last seen on 04/19/22. ?Until a few weeks ago she was participating in a wt loss medication study, Tirsepatide 10 mg weekly. She has noted wt gain after stopping medication.  ?She has not changed her diet and she is exercising regularly ?Cooking at home, eats plenty of vegetables. ? ?While she was on Tirsepatide her chronic constipation also improved. ?She would like to resume medication. ? ?Constipation ?This is a chronic problem. The current episode started more than 1 month ago. The problem has been gradually worsening since onset. Her stool frequency is 1 time per week or less. The stool is described as formed. The patient is on a high fiber diet. She Exercises regularly. There has Been adequate water intake. Associated symptoms include bloating. Pertinent negatives include no abdominal pain, anorexia, fecal incontinence, fever, flatus, hematochezia, hemorrhoids, melena, nausea, rectal pain or vomiting.  ?Hard stools, + straining. ?Bowel movements every 10 days. ?She has tried Prunes and in the past Miralax. ?Lab Results  ?Component Value Date  ? TSH 1.46 04/19/2021  ?Evaluated in the ED on 09/24/21 because nausea,vomiting,and diarrhea. Symptoms have resolved. ?Ca++ was mildly elevated at 11.0 and Cr mildly elevated. ? ?Component ?    Latest Ref Rng 09/24/2021  ?Glucose ?    70 - 99 mg/dL 92   ?BUN ?    6 - 23 mg/dL 23 (H)   ?Creatinine ?    0.40 - 1.20 mg/dL 1.04 (H)   ?GFR, Est Non African American ?    >59 mL/min/1.73   ?GFR, Est African American ?    >59 mL/min/1.73   ?BUN/Creatinine Ratio ?    9 - 23    ?Sodium ?    135 - 145 mEq/L 139   ?Potassium ?    3.5 - 5.1 mEq/L 4.1   ?Chloride ?    96 - 112 mEq/L 104   ?CO2 ?    19 - 32 mEq/L 24   ?Calcium ?    8.4 - 10.5 mg/dL 11.0 (H)   ?Total Protein ?    6.0 - 8.3 g/dL 8.5  (H)   ?Albumin ?    3.5 - 5.2 g/dL 5.0   ?Globulin, Total ?    1.5 - 4.5 g/dL   ?Albumin/Globulin Ratio ?    1.2 - 2.2    ?Total Bilirubin ?    0.2 - 1.2 mg/dL 0.7   ?Alkaline Phosphatase ?    39 - 117 U/L 70   ?AST ?    0 - 37 U/L 24   ?ALT ?    0 - 35 U/L 33   ?  ?Vit D def: She is not on vit D supplementation. ?Planning on starting multivitamins but afraid it will aggravate constipation. ?Lab Results  ?Component Value Date  ? CREATININE 1.04 (H) 09/24/2021  ? BUN 23 (H) 09/24/2021  ? NA 139 09/24/2021  ? K 4.1 09/24/2021  ? CL 104 09/24/2021  ? CO2 24 09/24/2021  ? ?Lab Results  ?Component Value Date  ? ALT 33 09/24/2021  ? AST 24 09/24/2021  ? ALKPHOS 70 09/24/2021  ? BILITOT 0.7 09/24/2021  ? ?Review of Systems  ?Constitutional:  Negative for fever.  ?Respiratory:  Negative for cough, shortness of breath and wheezing.   ?Cardiovascular:  Negative for  chest pain, palpitations and leg swelling.  ?Gastrointestinal:  Positive for bloating and constipation. Negative for abdominal pain, anorexia, flatus, hematochezia, hemorrhoids, melena, nausea, rectal pain and vomiting.  ?Endocrine: Negative for cold intolerance and heat intolerance.  ?Genitourinary:  Negative for decreased urine volume, dysuria and hematuria.  ?Musculoskeletal:  Negative for gait problem and myalgias.  ?Rest see pertinent positives and negatives per HPI. ? ?Current Outpatient Medications on File Prior to Visit  ?Medication Sig Dispense Refill  ? albuterol (VENTOLIN HFA) 108 (90 Base) MCG/ACT inhaler USE 2 PUFFS EVERY 4 HOURS AS NEEDED FOR WHEEZING 1 Inhaler 1  ? cetirizine (ZYRTEC) 10 MG tablet Take by mouth.    ? clobetasol ointment (TEMOVATE) 0.05 % Apply as directed twice daily for up to 5 days.  Do not use more than 5 days per month. 60 g 0  ? doxycycline (VIBRA-TABS) 100 MG tablet Take 1 tablet (100 mg total) by mouth 2 (two) times daily. If needed for eyelid sinus infection 14 tablet 0  ? erythromycin ophthalmic ointment Apply to right   lower eye lid   3 x per day after compresses 3.5 g 0  ? fluticasone furoate-vilanterol (BREO ELLIPTA) 100-25 MCG/INH AEPB Inhale 1 puff into the lungs daily. 84 each 1  ? Ibuprofen 200 MG CAPS ibuprofen 200 mg capsule ? Take 1 capsule every 6 hours by oral route.    ? ?No current facility-administered medications on file prior to visit.  ? ?Past Medical History:  ?Diagnosis Date  ? ASTHMA 04/02/2007  ? Asthma   ? 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 ?No Known Allergies ? ?Social History  ? ?Socioeconomic History  ? Marital status: Married  ?  Spouse name: Not on file  ? Number of children: 2  ? Years of education: Not on file  ? Highest education level: Not on file  ?Occupational History  ?  Employer: [employer]  ?Tobacco Use  ? Smoking status: Never  ? Smokeless tobacco: Never  ?Vaping Use  ? Vaping Use: Never used  ?Substance and Sexual Activity  ? Alcohol use: Not Currently  ?  Alcohol/week: 0.0 - 1.0 standard drinks  ? Drug use: No  ? Sexual activity: Not Currently  ?  Partners: Male  ?  Birth control/protection: Post-menopausal  ?Other Topics Concern  ? Not on file  ?Social History Narrative  ? Not on file  ? ?Social Determinants of Health  ? ?Financial Resource Strain: Not on file  ?Food Insecurity: Not on file  ?Transportation Needs: Not on file  ?Physical Activity: Not on file  ?Stress: Not on file  ?Social Connections: Not on file  ? ?Vitals:  ? 10/20/21 1124  ?BP: 110/70  ?Pulse: 73  ?Resp: 16  ?SpO2: 99%  ? ?Wt Readings from Last 3 Encounters:  ?10/20/21 143 lb 8 oz (65.1 kg)  ?09/24/21 134 lb 0.6 oz (60.8 kg)  ?04/19/21 134 lb (60.8 kg)  ? ?Body mass index is 23.52 kg/m?. ? ?Physical Exam ?Vitals and nursing note reviewed.  ?Constitutional:   ?   General: She is not in acute distress. ?   Appearance: She is well-developed.  ?HENT:  ?   Head: Normocephalic and atraumatic.  ?   Mouth/Throat:  ?   Mouth: Mucous membranes are moist.  ?   Pharynx: Oropharynx is clear.  ?Eyes:  ?   Conjunctiva/sclera: Conjunctivae  normal.  ?Cardiovascular:  ?   Rate and Rhythm: Normal rate and regular rhythm.  ?   Heart  sounds: No murmur heard. ?Pulmonary:  ?   Effort: Pulmonary effort is normal. No respiratory distress.  ?   Breath sounds: Normal breath sounds.  ?Abdominal:  ?   Palpations: Abdomen is soft. There is no hepatomegaly or mass.  ?   Tenderness: There is no abdominal tenderness.  ?Lymphadenopathy:  ?   Cervical: No cervical adenopathy.  ?Skin: ?   General: Skin is warm.  ?   Findings: No erythema or rash.  ?Neurological:  ?   General: No focal deficit present.  ?   Mental Status: She is alert and oriented to person, place, and time.  ?   Cranial Nerves: No cranial nerve deficit.  ?   Gait: Gait normal.  ?Psychiatric:  ?   Comments: Well groomed, good eye contact.  ? ?ASSESSMENT AND PLAN: ? ?Ms.Nara was seen today for maintain weight and constipation. ? ?Diagnoses and all orders for this visit: ?Orders Placed This Encounter  ?Procedures  ? Cologuard  ? Comprehensive metabolic panel  ? ?Lab Results  ?Component Value Date  ? CREATININE 0.84 10/20/2021  ? BUN 15 10/20/2021  ? NA 139 10/20/2021  ? K 4.0 10/20/2021  ? CL 104 10/20/2021  ? CO2 29 10/20/2021  ? ?Lab Results  ?Component Value Date  ? ALT 19 10/20/2021  ? AST 22 10/20/2021  ? ALKPHOS 65 10/20/2021  ? BILITOT 0.5 10/20/2021  ? ?Constipation, unspecified constipation type ?Chronic. ?Tirsepatide is not indicated for this problem. ?She is due for colon cancer screening, she would like to repeat Cologuard. ?Miralax and Bisacodyl 5 mg daily as needed. ?Continue adequate fiber and fluid intake. ?If still not any better in a few days, she can start Linzess 145 mcg every 1-2 days as needed. We discussed some side effects. ? ?Hypercalcemia ?Mild, she was sick at the time lab was done. ?Further recommendations according to CMP results. ? ?Vitamin D deficiency, unspecified ?Recommend starting OTC Vit D 2000 U daily. ? ?Colon cancer screening ?-     Cologuard ? ?Return if symptoms  worsen or fail to improve. ? ?Torion Hulgan G. Martinique, MD ? ?[place]. ?[place] office. ? ?

## 2021-10-20 ENCOUNTER — Ambulatory Visit (INDEPENDENT_AMBULATORY_CARE_PROVIDER_SITE_OTHER): Payer: 59 | Admitting: Family Medicine

## 2021-10-20 ENCOUNTER — Encounter: Payer: Self-pay | Admitting: Family Medicine

## 2021-10-20 VITALS — BP 110/70 | HR 73 | Resp 16 | Ht 65.5 in | Wt 143.5 lb

## 2021-10-20 DIAGNOSIS — K5900 Constipation, unspecified: Secondary | ICD-10-CM | POA: Diagnosis not present

## 2021-10-20 DIAGNOSIS — E559 Vitamin D deficiency, unspecified: Secondary | ICD-10-CM | POA: Diagnosis not present

## 2021-10-20 DIAGNOSIS — Z1211 Encounter for screening for malignant neoplasm of colon: Secondary | ICD-10-CM | POA: Diagnosis not present

## 2021-10-20 LAB — COMPREHENSIVE METABOLIC PANEL
ALT: 19 U/L (ref 0–35)
AST: 22 U/L (ref 0–37)
Albumin: 4.3 g/dL (ref 3.5–5.2)
Alkaline Phosphatase: 65 U/L (ref 39–117)
BUN: 15 mg/dL (ref 6–23)
CO2: 29 mEq/L (ref 19–32)
Calcium: 9.7 mg/dL (ref 8.4–10.5)
Chloride: 104 mEq/L (ref 96–112)
Creatinine, Ser: 0.84 mg/dL (ref 0.40–1.20)
GFR: 77.24 mL/min (ref >60.00)
Glucose, Bld: 71 mg/dL (ref 70–99)
Potassium: 4 mEq/L (ref 3.5–5.1)
Sodium: 139 mEq/L (ref 135–145)
Total Bilirubin: 0.5 mg/dL (ref 0.2–1.2)
Total Protein: 7.1 g/dL (ref 6.0–8.3)

## 2021-10-20 NOTE — Patient Instructions (Addendum)
A few things to remember from today's visit: ? ?Constipation, unspecified constipation type ? ?Colon cancer screening - Plan: Cologuard ? ?Hypercalcemia - Plan: Comprehensive metabolic panel ? ?Vitamin D deficiency, unspecified ? ?If you need refills please call your pharmacy. ?Do not use My Chart to request refills or for acute issues that need immediate attention. ?  ?You can try Miralax again along with Bisacodyl 5 mg every 1-2 days. ?Linzess 145 mcg  to take every other day if over the counter medications do not help. ?  ?Start Vit D 2000 U daily. ?Today we are re-checking calcium and kidney function. ? ?Please be sure medication list is accurate. ?If a new problem present, please set up appointment sooner than planned today. ? ?

## 2021-10-21 ENCOUNTER — Encounter: Payer: Self-pay | Admitting: Family Medicine

## 2021-10-21 MED ORDER — LINACLOTIDE 145 MCG PO CAPS: 145.0000 ug | ORAL_CAPSULE | Freq: Every day | ORAL | 1 refills | Status: DC

## 2021-11-15 ENCOUNTER — Encounter (HOSPITAL_BASED_OUTPATIENT_CLINIC_OR_DEPARTMENT_OTHER): Payer: Self-pay | Admitting: *Deleted

## 2021-11-17 ENCOUNTER — Encounter: Payer: Self-pay | Admitting: Family Medicine

## 2021-11-18 LAB — COLOGUARD: COLOGUARD: NEGATIVE

## 2021-11-22 ENCOUNTER — Encounter (HOSPITAL_BASED_OUTPATIENT_CLINIC_OR_DEPARTMENT_OTHER): Payer: Self-pay | Admitting: Obstetrics & Gynecology

## 2021-11-22 ENCOUNTER — Ambulatory Visit (INDEPENDENT_AMBULATORY_CARE_PROVIDER_SITE_OTHER): Payer: 59 | Admitting: Obstetrics & Gynecology

## 2021-11-22 VITALS — BP 113/72 | HR 61 | Ht 66.0 in | Wt 140.6 lb

## 2021-11-22 DIAGNOSIS — D72825 Bandemia: Secondary | ICD-10-CM | POA: Diagnosis not present

## 2021-11-22 DIAGNOSIS — Z01419 Encounter for gynecological examination (general) (routine) without abnormal findings: Secondary | ICD-10-CM | POA: Diagnosis not present

## 2021-11-22 DIAGNOSIS — E785 Hyperlipidemia, unspecified: Secondary | ICD-10-CM

## 2021-11-22 DIAGNOSIS — Z780 Asymptomatic menopausal state: Secondary | ICD-10-CM | POA: Diagnosis not present

## 2021-11-22 DIAGNOSIS — Z803 Family history of malignant neoplasm of breast: Secondary | ICD-10-CM

## 2021-11-22 NOTE — Progress Notes (Unsigned)
56 y.o. [id number] Married White or Caucasian female here for annual exam.  Doing well.  Has been at current weight for about 4 months. Pleased with her response to [facility].    Patient's last menstrual period was 06/25/2010 (approximate).          Sexually active: Yes.    The current method of family planning is post menopausal status.    Exercising: Yes.     Smoker:  no  Health Maintenance: Pap:  12/15/2019 History of abnormal Pap:  no MMG:  11/13/2021 Colonoscopy:  11/06/2021 cologuard negative BMD:   not indicated Screening Labs: with Dr. Martinique   reports that she has never smoked. She has never used smokeless tobacco. She reports that she does not currently use alcohol. She reports that she does not use drugs.  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     clobetasol ointment (TEMOVATE) 0.05 % Apply as directed twice daily for up to 5 days.  Do not use more than 5 days per month. 60 g 0   erythromycin ophthalmic ointment Apply to right  lower eye lid   3 x per day after compresses 3.5 g 0   Ibuprofen 200 MG CAPS ibuprofen 200 mg capsule  Take 1 capsule every 6 hours by oral route.     No current facility-administered medications for this visit.    Family History  Problem Relation Age of Onset   Bone cancer Sister 45       deceased   Breast cancer Sister 27       lives in [place]   Hypertension Brother    Hypertension Brother    Diabetes Son        on oral agents   ROS: Genitourinary:negative  Exam:   BP  113/72 (BP Location: Right Arm, Patient Position: Sitting, Cuff Size: Normal)   Pulse 61   Ht '5\' 6"'$  (1.676 m) Comment: Reported  Wt 140 lb 9.6 oz (63.8 kg)   LMP 06/25/2010 (Approximate)   BMI 22.69 kg/m   Height: '5\' 6"'$  (167.6 cm) (Reported)  General appearance: alert, cooperative and appears stated age Head: Normocephalic, without obvious abnormality, atraumatic Neck: no adenopathy, supple, symmetrical, trachea midline and thyroid normal to inspection and palpation and nodular Lungs: clear to auscultation bilaterally Breasts: normal appearance, no masses or tenderness Heart: regular rate and rhythm Abdomen: soft, non-tender; bowel sounds normal; no masses,  no organomegaly Extremities: extremities normal, atraumatic, no cyanosis or edema Skin: Skin color, texture, turgor normal. No rashes or lesions Lymph nodes: Cervical, supraclavicular, and axillary nodes normal. No abnormal inguinal nodes palpated Neurologic: Grossly normal   Pelvic: External genitalia:  no lesions              Urethra:  normal appearing urethra with no masses, tenderness or lesions              Bartholins and Skenes: normal  Vagina: normal appearing vagina with normal color and no discharge, no lesions              Cervix: no lesions              Pap taken: No. Bimanual Exam:  Uterus:  normal size, contour, position, consistency, mobility, non-tender              Adnexa: normal adnexa and no mass, fullness, tenderness               Rectovaginal: Confirms               Anus:  normal sphincter tone, no lesions  Chaperone, Octaviano Batty, CMA, was present for exam.  Assessment/Plan: 1. Well woman exam with routine gynecological exam - pap neg with neg HR HPV 2021.  Will repeat next year. - MMG 11/13/2021 - Cologuard neg 10/2021 - plan BMD around age 61 - lab work done with Dr. Martinique - vaccines reviewed/updated  2. Postmenopausal - no HRT  3. Elevated lipids - Lipid panel  4. Bandemia  with last CBC - CBC  5. Family history of breast cancer - [employer]ick model done last year 14.8% lifetime risk

## 2021-12-28 ENCOUNTER — Ambulatory Visit (HOSPITAL_BASED_OUTPATIENT_CLINIC_OR_DEPARTMENT_OTHER): Payer: 59 | Admitting: Obstetrics & Gynecology

## 2022-03-07 ENCOUNTER — Encounter (HOSPITAL_BASED_OUTPATIENT_CLINIC_OR_DEPARTMENT_OTHER): Payer: Self-pay | Admitting: Obstetrics & Gynecology

## 2022-04-17 ENCOUNTER — Ambulatory Visit (INDEPENDENT_AMBULATORY_CARE_PROVIDER_SITE_OTHER): Payer: 59 | Admitting: Family Medicine

## 2022-04-17 ENCOUNTER — Encounter: Payer: Self-pay | Admitting: Family Medicine

## 2022-04-17 VITALS — BP 110/70 | HR 70 | Temp 98.3°F | Resp 12 | Ht 66.0 in | Wt 157.1 lb

## 2022-04-17 DIAGNOSIS — R200 Anesthesia of skin: Secondary | ICD-10-CM

## 2022-04-17 DIAGNOSIS — M542 Cervicalgia: Secondary | ICD-10-CM | POA: Diagnosis not present

## 2022-04-17 MED ORDER — METHOCARBAMOL 500 MG PO TABS: 500.0000 mg | ORAL_TABLET | Freq: Three times a day (TID) | ORAL | 0 refills | Status: AC | PRN

## 2022-04-17 MED ORDER — PREDNISONE 20 MG PO TABS: ORAL_TABLET | ORAL | 0 refills | Status: AC

## 2022-04-17 MED ORDER — TRAMADOL HCL 50 MG PO TABS: 50.0000 mg | ORAL_TABLET | Freq: Two times a day (BID) | ORAL | 0 refills | Status: AC | PRN
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
## 2022-04-17 NOTE — Patient Instructions (Addendum)
A few things to remember from today's visit:  Acute neck pain - Plan: DG Cervical Spine Complete, traMADol (ULTRAM) 50 MG tablet, methocarbamol (ROBAXIN) 500 MG tablet  Left upper extremity numbness - Plan: predniSONE (DELTASONE) 20 MG tablet  Prednisone with breakfast and not at the same time that Ibuprofen. Tramadol and muscle relaxant cause drowsiness. Local massage and range of motion exercises. If numbness in left arm is not resolved in 4-5 weeks we will need MRI. Ibuprofen 400 mg 2-3 times daily with food and as needed.  If you need refills for medications you take chronically, please call your pharmacy. Do not use My Chart to request refills or for acute issues that need immediate attention. If you send a my chart message, it may take a few days to be addressed, specially if I am not in the office.  Please be sure medication list is accurate. If a new problem present, please set up appointment sooner than planned today.  Cervical Radiculopathy  Cervical radiculopathy means that a nerve in the neck (a cervical nerve) is pinched or bruised. This can happen because of an injury to the cervical spine (vertebrae) in the neck, or as a normal part of getting older. This condition can cause pain or loss of feeling (numbness) that runs from your neck all the way down to your arm and fingers. Often, this condition gets better with rest. Treatment may be needed if the condition does not get better. What are the causes? A neck injury. A bulging disk in your spine. Sudden muscle tightening (muscle spasms). Tight muscles in your neck due to overuse. Arthritis. Breakdown in the bones and joints of the spine (spondylosis) due to getting older. Bone spurs that form near the nerves in the neck. What are the signs or symptoms? Pain. The pain may: Run from the neck to the arm and hand. Be very bad or irritating. Get worse when you move your neck. Loss of feeling or tingling in your arm or  hand. Weakness in your arm or hand, in very bad cases. How is this treated? In many cases, treatment is not needed for this condition. With rest, the condition often gets better over time. If treatment is needed, options may include: Wearing a soft neck collar (cervical collar) for short periods of time. Doing exercises (physical therapy) to strengthen your neck muscles. Taking medicines. Having shots (injections) in your spine, in very bad cases. Having surgery. This may be needed if other treatments do not help. The type of surgery that is used will depend on the cause of your condition. Follow these instructions at home: If you have a soft neck collar: Wear it as told by your doctor. Take it off only as told by your doctor. Ask your doctor if you can take the collar off for cleaning and bathing. If you are allowed to take the collar off for cleaning or bathing: Follow instructions from your doctor about how to take off the collar safely. Clean the collar by wiping it with mild soap and water and drying it completely. Take out any removable pads in the collar every 1-2 days. Wash them by hand with soap and water. Let them air-dry completely before you put them back in the collar. Check your skin under the collar for redness or sores. If you see any, tell your doctor. Managing pain     Take over-the-counter and prescription medicines only as told by your doctor. If told, put ice on the painful area.  To do this: If you have a soft neck collar, take if off as told by your doctor. Put ice in a plastic bag. Place a towel between your skin and the bag. Leave the ice on for 20 minutes, 2-3 times a day. Take off the ice if your skin turns bright red. This is very important. If you cannot feel pain, heat, or cold, you have a greater risk of damage to the area. If using ice does not help, you can try using heat. Use the heat source that your doctor recommends, such as a moist heat pack or a  heating pad. Place a towel between your skin and the heat source. Leave the heat on for 20-30 minutes. Take off the heat if your skin turns bright red. This is very important. If you cannot feel pain, heat, or cold, you have a greater risk of getting burned. You may try a gentle neck and shoulder rub (massage). Activity Rest as needed. Return to your normal activities when your doctor says that it is safe. Do exercises as told by your doctor or physical therapist. You may have to avoid lifting. Ask your doctor how much you can safely lift. General instructions Use a flat pillow when you sleep. Do not drive while wearing a soft neck collar. If you do not have a soft neck collar, ask your doctor if it is safe to drive while your neck heals. Ask your doctor if you should avoid driving or using machines while you are taking your medicine. Do not smoke or use any products that contain nicotine or tobacco. If you need help quitting, ask your doctor. Keep all follow-up visits. Contact a doctor if: Your condition does not get better with treatment. Get help right away if: Your pain gets worse and medicine does not help. You lose feeling or feel weak in your hand, arm, face, or leg. You have a high fever. Your neck is stiff. You cannot control when you poop or pee (have incontinence). You have trouble with walking, balance, or talking. Summary Cervical radiculopathy means that a nerve in the neck is pinched or bruised. A nerve can get pinched from a bulging disk, arthritis, an injury to the neck, or other causes. Symptoms include pain, tingling, or loss of feeling that goes from the neck to the arm or hand. Weakness in your arm or hand can happen in very bad cases. Treatment may include resting, wearing a soft neck collar, and doing exercises. You might need to take medicines for pain. In very bad cases, shots or surgery may be needed. This information is not intended to replace advice given to  you by your health care provider. Make sure you discuss any questions you have with your health care provider. Document Revised: 12/15/2020 Document Reviewed: 12/15/2020 Elsevier Patient Education  [place].

## 2022-05-11 ENCOUNTER — Encounter: Payer: Self-pay | Admitting: Family Medicine

## 2022-05-11 ENCOUNTER — Telehealth (INDEPENDENT_AMBULATORY_CARE_PROVIDER_SITE_OTHER): Payer: 59 | Admitting: Family Medicine

## 2022-05-11 VITALS — Ht 66.0 in

## 2022-05-11 DIAGNOSIS — R0602 Shortness of breath: Secondary | ICD-10-CM | POA: Diagnosis not present

## 2022-05-11 DIAGNOSIS — J4540 Moderate persistent asthma, uncomplicated: Secondary | ICD-10-CM | POA: Diagnosis not present

## 2022-05-11 MED ORDER — ALBUTEROL SULFATE HFA 108 (90 BASE) MCG/ACT IN AERS: INHALATION_SPRAY | RESPIRATORY_TRACT | 1 refills | Status: DC

## 2022-05-11 MED ORDER — FLUTICASONE-SALMETEROL 250-50 MCG/ACT IN AEPB: 1.0000 | INHALATION_SPRAY | Freq: Two times a day (BID) | RESPIRATORY_TRACT | 3 refills | Status: DC

## 2022-05-11 NOTE — Assessment & Plan Note (Signed)
We discussed possible etiologies, no associated symptoms. Given her history of asthma, this is most likely the etiology, we are adjusting treatment. Monitor for new symptoms. Chest x-ray will be arranged, she is coming next week to have it done. Instructed about warning signs.

## 2022-05-11 NOTE — Progress Notes (Signed)
Virtual Visit via Video Note I connected with Kathleen Farley on 05/11/22 by a video enabled telemedicine application and verified that I am speaking with the correct person using two identifiers. Location patient: home Location provider:work office Persons participating in the virtual visit: patient, provider  I discussed the limitations of evaluation and management by telemedicine and the availability of in person appointments. The patient expressed understanding and agreed to proceed.  Chief Complaint  Patient presents with   Medication Management    Having to use her inhaler more   HPI: Kathleen Farley is a 56 year old female with history of asthma and seasonal allergies increased shortness of breath for the past two months, requiring more frequent use of their albuterol inhaler, almost once daily. The shortness of breath occurs primarily in the morning upon waking and during exposure to cold weather while walking outdoors. She denies experiencing any nocturnal symptoms, cough, wheezing, chest pain, or palpitations.  She reports relief from shortness of breath after using the albuterol inhaler. She denies any recent fever, chills, upper respiratory infection, nasal congestion, or runny nose.  No hx of tobacco use.  She has previously used Advair, which helped. Negative for abdominal pain, nausea, or changes in bowel habits.  ROS: See pertinent positives and negatives per HPI.  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   Past Surgical History:  Procedure Laterality Date   BREAST LUMPECTOMY Right 1994   BREAST LUMPECTOMY  07/2007   papilloma   SALPINGOOPHORECTOMY Right 2004   endometriosis   Family History  Problem Relation Age of Onset   Bone cancer Sister 51       deceased   Breast cancer Sister 71       lives in [place]   Hypertension Brother    Hypertension Brother    Diabetes Son        on oral agents     Social History   Socioeconomic History   Marital status: Married    Spouse name: Not on file   Number of children: 2   Years of education: Not on file   Highest education level: Not on file  Occupational History    Employer: [employer]  Tobacco Use   Smoking status: Never   Smokeless tobacco: Never  Vaping Use   Vaping Use: Never used  Substance and Sexual Activity   Alcohol use: Not Currently    Alcohol/week: 0.0 - 1.0 standard drinks of alcohol   Drug use: No   Sexual activity: Not Currently    Partners: Male    Birth control/protection: Post-menopausal  Other Topics Concern   Not on file  Social History Narrative   Not on file   Social Determinants of Health   Financial Resource Strain: Not on file  Food Insecurity: Not on file  Transportation Needs: Not on file  Physical Activity: Not on file  Stress: Not on file  Social Connections: Not on file  Intimate Partner Violence: Not on file   Current Outpatient Medications:    cetirizine (ZYRTEC) 10 MG tablet, Take by mouth., Disp: , Rfl:    clobetasol ointment (TEMOVATE) 0.05 %, Apply as directed twice daily for up to 5 days.  Do not use more than 5 days per month., Disp: 60 g, Rfl: 0   fluticasone-salmeterol (ADVAIR DISKUS) 250-50 MCG/ACT AEPB, Inhale 1 puff into the lungs in the morning and at bedtime., Disp: 60 each,  Rfl: 3   Ibuprofen 200 MG CAPS, ibuprofen 200 mg capsule  Take 1 capsule every 6 hours by oral route., Disp: , Rfl:    albuterol (VENTOLIN HFA) 108 (90 Base) MCG/ACT inhaler, USE 2 PUFFS EVERY 4 HOURS AS NEEDED FOR WHEEZING, Disp: 1 each, Rfl: 1  EXAM:  VITALS per patient if applicable:Ht '5\' 6"'$  (1.676 m)   LMP 06/25/2010 (Approximate)   BMI 25.36 kg/m   GENERAL: alert, oriented, appears well and in no acute distress  HEENT: atraumatic, conjunctiva clear, no obvious abnormalities on inspection.  NECK: normal movements of the head and neck  LUNGS: on inspection no signs of respiratory  distress, breathing rate appears normal, no obvious gross SOB, gasping or wheezing  CV: no obvious cyanosis  MS: moves all visible extremities without noticeable abnormality  PSYCH/NEURO: pleasant and cooperative, no obvious depression or anxiety, speech and thought processing grossly intact  ASSESSMENT AND PLAN:  Discussed the following assessment and plan:  Shortness of breath Assessment & Plan: We discussed possible etiologies, no associated symptoms. Given her history of asthma, this is most likely the etiology, we are adjusting treatment. Monitor for new symptoms. Chest x-ray will be arranged, she is coming next week to have it done. Instructed about warning signs.  Orders: -     DG Chest 2 View; Future  Not well controlled moderate persistent asthma Assessment & Plan: Problem seems to be getting worse for the past 2 months. She has tried Advair in the past, so Advair 250-50 mcg to use twice daily started today.  We discussed some side effects, instructed to swish after use. Continue albuterol inhaler 2 puff every 4-6 hours as needed for shortness of breath. Follow-up in 3 months, before if needed.  Orders: -     Albuterol Sulfate HFA; USE 2 PUFFS EVERY 4 HOURS AS NEEDED FOR WHEEZING  Dispense: 1 each; Refill: 1 -     Fluticasone-Salmeterol; Inhale 1 puff into the lungs in the morning and at bedtime.  Dispense: 60 each; Refill: 3   We discussed possible serious and likely etiologies, options for evaluation and workup, limitations of telemedicine visit vs in person visit, treatment, treatment risks and precautions. The patient was advised to call back or seek an in-person evaluation if the symptoms worsen or if the condition fails to improve as anticipated. I discussed the assessment and treatment plan with the patient. The patient was provided an opportunity to ask questions and all were answered. The patient agreed with the plan and demonstrated an understanding of the  instructions.  Return in about 3 months (around 08/11/2022) for chronic problems.  Pasqualino Witherspoon G. Martin[place], MD  [facility]. [place] office.

## 2022-05-11 NOTE — Assessment & Plan Note (Addendum)
Problem seems to be getting worse for the past 2 months. She has tried Advair in the past, so Advair 250-50 mcg to use twice daily started today.  We discussed some side effects, instructed to swish after use. Continue albuterol inhaler 2 puff every 4-6 hours as needed for shortness of breath. Follow-up in 3 months, before if needed.

## 2022-05-15 ENCOUNTER — Other Ambulatory Visit: Payer: 59

## 2022-05-16 ENCOUNTER — Other Ambulatory Visit: Payer: 59

## 2022-05-23 ENCOUNTER — Telehealth: Payer: Self-pay | Admitting: Family Medicine

## 2022-05-23 NOTE — Telephone Encounter (Signed)
Lvm for patient to schedule 3 month follow up with Dr.Jordan      Juluis Rainier

## 2022-05-23 NOTE — Telephone Encounter (Signed)
-----   Message from Betty G Martinique, MD sent at 05/11/2022 10:26 AM EST ----- 3 months follow-up.  Thank you

## 2022-06-11 ENCOUNTER — Telehealth: Payer: Self-pay | Admitting: Family Medicine

## 2022-06-11 ENCOUNTER — Encounter: Payer: Self-pay | Admitting: Family Medicine

## 2022-06-11 ENCOUNTER — Ambulatory Visit (INDEPENDENT_AMBULATORY_CARE_PROVIDER_SITE_OTHER): Payer: 59 | Admitting: Family Medicine

## 2022-06-11 VITALS — BP 118/70 | HR 82 | Temp 98.3°F | Resp 16 | Ht 66.0 in | Wt 160.1 lb

## 2022-06-11 DIAGNOSIS — J4540 Moderate persistent asthma, uncomplicated: Secondary | ICD-10-CM | POA: Diagnosis not present

## 2022-06-11 DIAGNOSIS — R059 Cough, unspecified: Secondary | ICD-10-CM | POA: Diagnosis not present

## 2022-06-11 DIAGNOSIS — J069 Acute upper respiratory infection, unspecified: Secondary | ICD-10-CM | POA: Diagnosis not present

## 2022-06-11 LAB — POC INFLUENZA A&B (BINAX/QUICKVUE)
Influenza A, POC: NEGATIVE
Influenza B, POC: NEGATIVE

## 2022-06-11 LAB — POC COVID19 BINAXNOW: SARS Coronavirus 2 Ag: NEGATIVE

## 2022-06-11 MED ORDER — PREDNISONE 20 MG PO TABS: 40.0000 mg | ORAL_TABLET | Freq: Every day | ORAL | 0 refills | Status: AC

## 2022-06-11 MED ORDER — ALBUTEROL SULFATE HFA 108 (90 BASE) MCG/ACT IN AERS: INHALATION_SPRAY | RESPIRATORY_TRACT | 1 refills | Status: DC

## 2022-06-11 MED ORDER — BENZONATATE 100 MG PO CAPS: 200.0000 mg | ORAL_CAPSULE | Freq: Two times a day (BID) | ORAL | 0 refills | Status: AC | PRN

## 2022-06-11 NOTE — Telephone Encounter (Signed)
I called and spoke with patient. She is having upper respiratory symptoms; chest tightness if from the coughing. In person appointment scheduled for tomorrow at 2:30; patient will arrive at 2 for testing.

## 2022-06-11 NOTE — Patient Instructions (Addendum)
A few things to remember from today's visit:  Cough, unspecified type - Plan: POC Influenza A&B(BINAX/QUICKVUE), POC COVID-19, benzonatate (TESSALON) 100 MG capsule  Asthma, well controlled, moderate persistent - Plan: predniSONE (DELTASONE) 20 MG tablet  URI, acute  Prednisone with breakfast for 3-5 days. Albuterol inh 2 puff every 6 hours for a week then as needed for wheezing or shortness of breath.  Plain Mucinex may also help.  If you need refills for medications you take chronically, please call your pharmacy. Do not use My Chart to request refills or for acute issues that need immediate attention. If you send a my chart message, it may take a few days to be addressed, specially if I am not in the office.  Please be sure medication list is accurate. If a new problem present, please set up appointment sooner than planned today.

## 2022-06-11 NOTE — Telephone Encounter (Signed)
Kathleen Farley stated pt called with some chest tightness and had an outcome of 911 however pt declined.   Please advise

## 2022-06-11 NOTE — Progress Notes (Signed)
ACUTE VISIT Chief Complaint  Patient presents with   Cough   Nasal Congestion   HPI: [name] is a 56 y.o. female, who is here today complaining of 2-3 days of above symptoms.  She reports having been sick since Friday.  She states that she has had fever with a maximum temperature of 36F. She also complains of a sore throat, shortness of breath, and chest tightness.  Cough This is a new problem. The current episode started in the past 7 days. Associated symptoms include chills, ear congestion, a fever, headaches, myalgias, nasal congestion, postnasal drip, rhinorrhea, a sore throat, shortness of breath and wheezing. Pertinent negatives include no chest pain, ear pain, heartburn, hemoptysis, rash, sweats or weight loss. Nothing aggravates the symptoms. Her past medical history is significant for asthma and environmental allergies.  Negative for changes in her sense of smell or taste.  Asthma and has been using her inhaler twice today and 1-2 a day on average since she has been sick.  She denies any abdominal pain, nausea, vomiting, or changes in bowel habits. She has no known sick contact. She recently traveled to [place].  Today she tested negative for both COVID-19 and flu.  Virtual visit 05/11/22, when she was c/o SOB, resolved. CXR was recommended, has not been done yet.  Review of Systems  Constitutional:  Positive for chills and fever. Negative for weight loss.  HENT:  Positive for congestion, postnasal drip, rhinorrhea and sore throat. Negative for ear pain.   Respiratory:  Positive for cough, shortness of breath and wheezing. Negative for hemoptysis.   Cardiovascular:  Negative for chest pain.  Gastrointestinal:  Negative for heartburn.  Genitourinary:  Negative for dysuria and hematuria.  Musculoskeletal:  Positive for myalgias.  Skin:  Negative for rash.  Allergic/Immunologic: Positive for environmental allergies.  Neurological:  Positive for headaches. Negative  for syncope.  See other pertinent positives and negatives in HPI.  Current Outpatient Medications on File Prior to Visit  Medication Sig Dispense Refill   cetirizine (ZYRTEC) 10 MG tablet Take by mouth.     clobetasol ointment (TEMOVATE) 0.05 % Apply as directed twice daily for up to 5 days.  Do not use more than 5 days per month. 60 g 0   fluticasone-salmeterol (ADVAIR DISKUS) 250-50 MCG/ACT AEPB Inhale 1 puff into the lungs in the morning and at bedtime. 60 each 3   Ibuprofen 200 MG CAPS ibuprofen 200 mg capsule  Take 1 capsule every 6 hours by oral route.     No current facility-administered medications on file prior to visit.   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   No Known Allergies  Social History   Socioeconomic History   Marital status: Married    Spouse name: Not on file   Number of children: 2   Years of education: Not on file   Highest education level: Not on file  Occupational History    Employer: [employer]  Tobacco Use   Smoking status: Never   Smokeless tobacco: Never  Vaping Use   Vaping Use: Never used  Substance and Sexual Activity   Alcohol use: Not Currently    Alcohol/week: 0.0 - 1.0 standard drinks of alcohol   Drug use: No   Sexual activity: Not Currently    Partners: Male    Birth control/protection: Post-menopausal  Other Topics Concern   Not on file  Social  History Narrative   Not on file   Social Determinants of Health   Financial Resource Strain: Not on file  Food Insecurity: Not on file  Transportation Needs: Not on file  Physical Activity: Not on file  Stress: Not on file  Social Connections: Not on file   Vitals:   06/11/22 1144  BP: 118/70  Pulse: 82  Resp: 16  Temp: 98.3 F (36.8 C)  SpO2: 97%   Body mass index is 25.84 kg/m.  Physical Exam Vitals and nursing note reviewed.  Constitutional:      General: She is not in acute distress.    Appearance: She  is well-developed. She is not ill-appearing.  HENT:     Head: Normocephalic and atraumatic.     Right Ear: Tympanic membrane, ear canal and external ear normal.     Left Ear: Tympanic membrane, ear canal and external ear normal.     Nose: Congestion and rhinorrhea present.     Mouth/Throat:     Mouth: Mucous membranes are moist.     Pharynx: Oropharynx is clear.  Eyes:     Conjunctiva/sclera: Conjunctivae normal.  Cardiovascular:     Rate and Rhythm: Normal rate and regular rhythm.     Heart sounds: No murmur heard. Pulmonary:     Effort: Pulmonary effort is normal. No respiratory distress.     Breath sounds: Normal breath sounds. No stridor.  Musculoskeletal:     Cervical back: No muscular tenderness.  Lymphadenopathy:     Head:     Right side of head: No submandibular adenopathy.     Left side of head: No submandibular adenopathy.     Cervical: No cervical adenopathy.  Skin:    General: Skin is warm.     Findings: No erythema or rash.  Neurological:     Mental Status: She is alert and oriented to person, place, and time.  Psychiatric:        Mood and Affect: Affect normal. Mood is anxious.   ASSESSMENT AND PLAN:  [name] was seen today for cough and nasal congestion.  Diagnoses and all orders for this visit:  Asthma, well controlled, moderate persistent Auscultation today negative. Because reporting wheezing, recommend course of Prednisone 40 mg x 3-5 days, some side effects discussed. Albuterol inh 2 puff every 6 hours for a week then as needed for wheezing or shortness of breath.  Instructed about warning signs.  -     predniSONE (DELTASONE) 20 MG tablet; Take 2 tablets (40 mg total) by mouth daily with breakfast for 3 days. -     albuterol (VENTOLIN HFA) 108 (90 Base) MCG/ACT inhaler; USE 2 PUFFS EVERY 4 HOURS AS NEEDED FOR WHEEZING  URI, acute Symptoms suggests a viral etiology, symptomatic treatment recommended. COVID 19 and flu rapid test negative  today.  Instructed to monitor for signs of complications, including new onset of fever among some, clearly instructed about warning signs. I also explained that cough and nasal congestion can last a few days and sometimes weeks. F/U as needed.  Cough, unspecified type Benzonatate 100 mg 1-2 caps bid prn, adequate hydration,and Plain mucinex. Recommend having CXR done today.  -     POC Influenza A&B(BINAX/QUICKVUE) -     POC COVID-19 BinaxNow -     Benzonatate; Take 2 capsules (200 mg total) by mouth 2 (two) times daily as needed for up to 10 days.  Dispense: 30 capsule; Refill: 0  Return if symptoms worsen or fail to improve, for  keep next appointment.  Sathvik Tiedt G. Martinique, MD  [facility]. [place] office.

## 2022-06-12 ENCOUNTER — Ambulatory Visit: Payer: 59 | Admitting: Family Medicine

## 2022-07-06 ENCOUNTER — Other Ambulatory Visit: Payer: Self-pay | Admitting: Family Medicine

## 2022-07-06 DIAGNOSIS — J4542 Moderate persistent asthma with status asthmaticus: Secondary | ICD-10-CM

## 2022-07-06 MED ORDER — FLUTICASONE FUROATE-VILANTEROL 100-25 MCG/ACT IN AEPB: 1.0000 | INHALATION_SPRAY | Freq: Every day | RESPIRATORY_TRACT | 2 refills | Status: DC

## 2022-09-17 ENCOUNTER — Encounter: Payer: Self-pay | Admitting: Family Medicine

## 2022-09-17 ENCOUNTER — Ambulatory Visit (INDEPENDENT_AMBULATORY_CARE_PROVIDER_SITE_OTHER): Payer: 59 | Admitting: Family Medicine

## 2022-09-17 VITALS — BP 126/70 | HR 70 | Temp 97.9°F | Resp 12 | Ht 66.0 in | Wt 154.0 lb

## 2022-09-17 DIAGNOSIS — M654 Radial styloid tenosynovitis [de Quervain]: Secondary | ICD-10-CM

## 2022-09-17 DIAGNOSIS — R202 Paresthesia of skin: Secondary | ICD-10-CM | POA: Diagnosis not present

## 2022-09-17 DIAGNOSIS — M542 Cervicalgia: Secondary | ICD-10-CM

## 2022-09-17 DIAGNOSIS — R200 Anesthesia of skin: Secondary | ICD-10-CM | POA: Diagnosis not present

## 2022-09-17 LAB — BASIC METABOLIC PANEL
BUN: 13 mg/dL (ref 6–23)
CO2: 26 mEq/L (ref 19–32)
Calcium: 9.8 mg/dL (ref 8.4–10.5)
Chloride: 104 mEq/L (ref 96–112)
Creatinine, Ser: 0.96 mg/dL (ref 0.40–1.20)
GFR: 65.38 mL/min (ref >60.00)
Glucose, Bld: 87 mg/dL (ref 70–99)
Potassium: 3.9 mEq/L (ref 3.5–5.1)
Sodium: 138 mEq/L (ref 135–145)

## 2022-09-17 LAB — CBC
HCT: 42.7 % (ref 36.0–46.0)
Hemoglobin: 14.5 g/dL (ref 12.0–15.0)
MCHC: 34.1 g/dL (ref 30.0–36.0)
MCV: 84.8 fl (ref 78.0–100.0)
Platelets: 263 10*3/uL (ref 150.0–400.0)
RBC: 5.03 Mil/uL (ref 3.87–5.11)
RDW: 12.9 % (ref 11.5–15.5)
WBC: 4.4 10*3/uL (ref 4.0–10.5)

## 2022-09-17 MED ORDER — METHOCARBAMOL 500 MG PO TABS: 500.0000 mg | ORAL_TABLET | Freq: Three times a day (TID) | ORAL | 0 refills | Status: DC | PRN

## 2022-09-17 NOTE — Progress Notes (Signed)
ACUTE VISIT Chief Complaint  Patient presents with   Hand Pain    Right hand x 3 weeks, no known injury, comes & goes.    HPI: Kathleen Farley is a 57 y.o. right-handed female, who is here today complaining of  right hand pain, specifically around the thumb, which has persisted for the past three weeks.  Hand Pain  The incident occurred more than 1 week ago. There was no injury mechanism. The pain is present in the right hand. The pain is at a severity of 9/10. The pain is severe. Associated symptoms include tingling. Pertinent negatives include no chest pain, muscle weakness or numbness. The symptoms are aggravated by movement. She has tried acetaminophen for the symptoms. The treatment provided mild relief.   The pain is characterized as sharp, occurring intermittently and exacerbating with movement. By the end of the day, swelling is noted in the affected area.  There is no reported history of recent injury or engagement in unusual activities involving the right hand.  Chronic neck pain and tension, specially on left side, it is not radiated to UE. which does not extend down to the arm.   Additionally, she experiences numbness and tingling sensations in the ulnar aspect of forearm, coinciding with the duration of the hand pain. No associated rash or weakness.  Lab Results  Component Value Date   WBC 10.9 (H) 09/24/2021   HGB 16.4 (H) 09/24/2021   HCT 49.6 (H) 09/24/2021   MCV 85.5 09/24/2021   PLT 344 09/24/2021   Review of Systems  Constitutional:  Negative for chills and fever.  HENT:  Negative for sore throat and trouble swallowing.   Respiratory:  Negative for cough, shortness of breath and wheezing.   Cardiovascular:  Negative for chest pain.  Gastrointestinal:  Negative for abdominal pain, nausea and vomiting.  Neurological:  Positive for tingling. Negative for syncope and numbness.  See other pertinent positives and negatives in HPI.  Current Outpatient  Medications on File Prior to Visit  Medication Sig Dispense Refill   albuterol (VENTOLIN HFA) 108 (90 Base) MCG/ACT inhaler USE 2 PUFFS EVERY 4 HOURS AS NEEDED FOR WHEEZING 1 each 1   cetirizine (ZYRTEC) 10 MG tablet Take by mouth.     clobetasol ointment (TEMOVATE) 0.05 % Apply as directed twice daily for up to 5 days.  Do not use more than 5 days per month. 60 g 0   fluticasone furoate-vilanterol (BREO ELLIPTA) 100-25 MCG/ACT AEPB Inhale 1 puff into the lungs daily. 1 each 2   Ibuprofen 200 MG CAPS ibuprofen 200 mg capsule  Take 1 capsule every 6 hours by oral route.     No current facility-administered medications on file prior to visit.   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   No Known Allergies  Social History   Socioeconomic History   Marital status: Married    Spouse name: Not on file   Number of children: 2   Years of education: Not on file   Highest education level: Not on file  Occupational History    Employer: [employer]  Tobacco Use   Smoking status: Never   Smokeless tobacco: Never  Vaping Use   Vaping Use: Never used  Substance and Sexual Activity   Alcohol use: Not Currently    Alcohol/week: 0.0 - 1.0 standard drinks of alcohol   Drug use: No   Sexual activity:  Not Currently    Partners: Male    Birth control/protection: Post-menopausal  Other Topics Concern   Not on file  Social History Narrative   Not on file   Social Determinants of Health   Financial Resource Strain: Not on file  Food Insecurity: Not on file  Transportation Needs: Not on file  Physical Activity: Not on file  Stress: Not on file  Social Connections: Not on file   Vitals:   09/17/22 0757  BP: 126/70  Pulse: 70  Resp: 12  Temp: 97.9 F (36.6 C)  SpO2: 98%   Body mass index is 24.86 kg/m.  Physical Exam Vitals and nursing note reviewed.  Constitutional:      General: She is not in acute distress.     Appearance: She is well-developed. She is not ill-appearing.  HENT:     Head: Normocephalic and atraumatic.  Eyes:     Conjunctiva/sclera: Conjunctivae normal.  Cardiovascular:     Rate and Rhythm: Normal rate and regular rhythm.     Heart sounds: No murmur heard. Pulmonary:     Effort: Pulmonary effort is normal. No respiratory distress.     Breath sounds: Normal breath sounds.  Musculoskeletal:     Right hand: Normal range of motion. Normal capillary refill. Normal pulse.     Cervical back: Tenderness present. No bony tenderness. Normal range of motion.       Back:     Comments: Right wrist: Tenderness upon palpation of radial styloid, no edema or erythema appreciated, no limitation of wrist ROM. Pain also elicited on radial styloid with Finkelstein maneuver.  Lymphadenopathy:     Cervical: No cervical adenopathy.  Skin:    General: Skin is warm.     Findings: No erythema or rash.  Neurological:     General: No focal deficit present.     Mental Status: She is alert and oriented to person, place, and time.     Sensory: No sensory deficit.     Gait: Gait normal.  Psychiatric:        Mood and Affect: Affect normal. Mood is anxious.   ASSESSMENT AND PLAN:  Ms. Tiombe was seen today for hand pain.  Diagnoses and all orders for this visit:  Numbness and tingling Today she is reporting problem in ulnar aspect of forearm. In 03/2022 she had LUE numbness. We discussed possible etiologies. Disc degenerative changes were seen on cervical spine CT in 2008. If problem is persistent she may need to have cervical MRI and/or EMG.  -     CBC -     Vitamin B12 -     Basic metabolic panel  De Quervain's tenosynovitis, right We discussed Dx, prognosis,and treatment options. Wrist splint with thumb support recommended. Sport medicine referral placed.  -     Ambulatory referral to Sports Medicine  Cervicalgia Chronic, affecting left side. Methocarbamol 500 mg recommended, side  effects discussed. Local massage and icy hot or asper cream may help. Referral tp sport medicine placed.  -     methocarbamol (ROBAXIN) 500 MG tablet; Take 1 tablet (500 mg total) by mouth every 8 (eight) hours as needed for muscle spasms.   No follow-ups on file.  Ilian Wessell G. [place], MD  [facility]. [place] office.

## 2022-09-17 NOTE — Patient Instructions (Addendum)
A few things to remember from today's visit:  De Quervain's tenosynovitis, right - Plan: Ambulatory referral to Sports Medicine  Cervicalgia - Plan: methocarbamol (ROBAXIN) 500 MG tablet  Numbness and tingling - Plan: Basic metabolic panel, Vitamin [id number], CBC  For neck pain local massage and topical icy hot or asper cream may help. Methocarbamol can cause drowsiness.  Right wrist splint with thumb support.  If you need refills for medications you take chronically, please call your pharmacy. Do not use My Chart to request refills or for acute issues that need immediate attention. If you send a my chart message, it may take a few days to be addressed, specially if I am not in the office.  Please be sure medication list is accurate. If a new problem present, please set up appointment sooner than planned today.

## 2022-09-18 LAB — VITAMIN B12: Vitamin B-12: 325 pg/mL (ref 211–911)

## 2022-09-25 NOTE — Progress Notes (Unsigned)
    Kathleen Farley D.[place] Phone: [phone]   Assessment and Plan:     There are no diagnoses linked to this encounter.  ***   Pertinent previous records reviewed include ***   Follow Up: ***     Subjective:   I, Kathleen Farley, am serving as a Education administrator for Doctor Glennon Mac  Chief Complaint: thumb and neck pain   HPI:   09/26/2022  Patient is a 57 year old female complaining of thumb and neck pain. Patient states  Relevant Historical Information: ***  Additional pertinent review of systems negative.   Current Outpatient Medications:    albuterol (VENTOLIN HFA) 108 (90 Base) MCG/ACT inhaler, USE 2 PUFFS EVERY 4 HOURS AS NEEDED FOR WHEEZING, Disp: 1 each, Rfl: 1   cetirizine (ZYRTEC) 10 MG tablet, Take by mouth., Disp: , Rfl:    clobetasol ointment (TEMOVATE) 0.05 %, Apply as directed twice daily for up to 5 days.  Do not use more than 5 days per month., Disp: 60 g, Rfl: 0   fluticasone furoate-vilanterol (BREO ELLIPTA) 100-25 MCG/ACT AEPB, Inhale 1 puff into the lungs daily., Disp: 1 each, Rfl: 2   Ibuprofen 200 MG CAPS, ibuprofen 200 mg capsule  Take 1 capsule every 6 hours by oral route., Disp: , Rfl:    methocarbamol (ROBAXIN) 500 MG tablet, Take 1 tablet (500 mg total) by mouth every 8 (eight) hours as needed for muscle spasms., Disp: 45 tablet, Rfl: 0   Objective:     There were no vitals filed for this visit.    There is no height or weight on file to calculate BMI.    Physical Exam:    ***   Electronically signed by:  Kathleen Farley D.[facility] 7:33 AM 09/25/22

## 2022-09-26 ENCOUNTER — Ambulatory Visit: Payer: 59

## 2022-09-26 ENCOUNTER — Ambulatory Visit (INDEPENDENT_AMBULATORY_CARE_PROVIDER_SITE_OTHER): Payer: 59 | Admitting: Sports Medicine

## 2022-09-26 VITALS — BP 120/85 | HR 65 | Ht 66.0 in | Wt 151.0 lb

## 2022-09-26 DIAGNOSIS — G8929 Other chronic pain: Secondary | ICD-10-CM | POA: Diagnosis not present

## 2022-09-26 DIAGNOSIS — M542 Cervicalgia: Secondary | ICD-10-CM | POA: Diagnosis not present

## 2022-09-26 DIAGNOSIS — M79644 Pain in right finger(s): Secondary | ICD-10-CM | POA: Diagnosis not present

## 2022-09-26 DIAGNOSIS — M25531 Pain in right wrist: Secondary | ICD-10-CM | POA: Diagnosis not present

## 2022-09-26 MED ORDER — MELOXICAM 15 MG PO TABS
15.0000 mg | ORAL_TABLET | Freq: Every day | ORAL | 0 refills | Status: DC
Start: 2022-09-26 — End: 2023-03-15

## 2022-09-26 NOTE — Patient Instructions (Addendum)
Good to see you  - Start meloxicam 15 mg daily x2 weeks.  If still having pain after 2 weeks, complete 3rd-week of meloxicam. May use remaining meloxicam as needed once daily for pain control.  Do not to use additional NSAIDs while taking meloxicam.  May use Tylenol [phone] mg 2 to 3 times a day for breakthrough pain. 3-4 week follow up  Neck HEP

## 2022-10-07 ENCOUNTER — Other Ambulatory Visit: Payer: Self-pay | Admitting: Family Medicine

## 2022-10-07 DIAGNOSIS — J4542 Moderate persistent asthma with status asthmaticus: Secondary | ICD-10-CM

## 2022-10-19 NOTE — Progress Notes (Unsigned)
    Kathleen Farley D.[facility] [address] [place] [address] Phone: [phone]   Assessment and Plan:     There are no diagnoses linked to this encounter.  ***   Pertinent previous records reviewed include ***   Follow Up: ***     Subjective:   I, Kathleen Farley, am serving as a Neurosurgeon for Doctor Richardean Sale   Chief Complaint: thumb and neck pain    HPI:    09/26/2022  Patient is a 57 year old female complaining of thumb and neck pain. Patient states a month ago. There was no injury mechanism. The pain is present in the right hand. The pain is at a severity of 9/10. The pain is severe. Associated symptoms include tingling. Pertinent negatives include no chest pain, muscle weakness or numbness. The symptoms are aggravated by movement. She has tried ibu  for the symptoms and that did not help . The treatment provided mild relief.    The pain is characterized as sharp, occurring intermittently and exacerbating with movement. By the end of the day, swelling is noted in the affected area.  There is no reported history of recent injury or engagement in unusual activities involving the right hand.   Chronic neck pain and tension, specially on left side, decreased ROM , thinks the pain is coming from the thumb , she is doing pilates and massage that really helps    10/22/2022 Patient states    Additionally, she experiences numbness and tingling sensations in the ulnar aspect of forearm, coinciding with the duration of the hand pain. No associated rash or weakness.   Relevant Historical Information: None pertinent  Additional pertinent review of systems negative.   Current Outpatient Medications:    albuterol (VENTOLIN HFA) 108 (90 Base) MCG/ACT inhaler, USE 2 PUFFS EVERY 4 HOURS AS NEEDED FOR WHEEZING, Disp: 1 each, Rfl: 1   cetirizine (ZYRTEC) 10 MG tablet, Take by mouth., Disp: , Rfl:    clobetasol ointment (TEMOVATE) 0.05 %, Apply  as directed twice daily for up to 5 days.  Do not use more than 5 days per month., Disp: 60 g, Rfl: 0   fluticasone furoate-vilanterol (BREO ELLIPTA) 100-25 MCG/ACT AEPB, TAKE 1 PUFF BY MOUTH EVERY DAY, Disp: 180 each, Rfl: 3   Ibuprofen 200 MG CAPS, ibuprofen 200 mg capsule  Take 1 capsule every 6 hours by oral route., Disp: , Rfl:    meloxicam (MOBIC) 15 MG tablet, Take 1 tablet (15 mg total) by mouth daily., Disp: 30 tablet, Rfl: 0   methocarbamol (ROBAXIN) 500 MG tablet, Take 1 tablet (500 mg total) by mouth every 8 (eight) hours as needed for muscle spasms., Disp: 45 tablet, Rfl: 0   Objective:     There were no vitals filed for this visit.    There is no height or weight on file to calculate BMI.    Physical Exam:    ***   Electronically signed by:  Kathleen Farley D.[facility] 7:28 AM 10/19/22

## 2022-10-22 ENCOUNTER — Other Ambulatory Visit: Payer: Self-pay | Admitting: Sports Medicine

## 2022-10-22 ENCOUNTER — Ambulatory Visit (INDEPENDENT_AMBULATORY_CARE_PROVIDER_SITE_OTHER): Payer: 59 | Admitting: Sports Medicine

## 2022-10-22 ENCOUNTER — Ambulatory Visit (INDEPENDENT_AMBULATORY_CARE_PROVIDER_SITE_OTHER): Payer: 59

## 2022-10-22 VITALS — HR 70 | Ht 66.0 in | Wt 151.0 lb

## 2022-10-22 DIAGNOSIS — M79641 Pain in right hand: Secondary | ICD-10-CM

## 2022-10-22 DIAGNOSIS — G8929 Other chronic pain: Secondary | ICD-10-CM

## 2022-10-22 DIAGNOSIS — M1811 Unilateral primary osteoarthritis of first carpometacarpal joint, right hand: Secondary | ICD-10-CM | POA: Diagnosis not present

## 2022-10-22 DIAGNOSIS — M79644 Pain in right finger(s): Secondary | ICD-10-CM

## 2022-10-22 NOTE — Patient Instructions (Addendum)
Good to see you As needed follow up  Pt referral Discontinue meloxicam and use remainder as needed no more than 1-2 times per day  Tylenol [phone] mg 2-3 times a day for pain relief  Recommend warm hand baths and wrist brace as needed

## 2022-10-22 NOTE — Addendum Note (Signed)
Addended by: Jerene Canny R on: 10/22/2022 02:05 PM   Modules accepted: Orders

## 2022-11-01 ENCOUNTER — Telehealth: Payer: Self-pay | Admitting: Sports Medicine

## 2022-11-01 NOTE — Telephone Encounter (Signed)
We gave pt a brace for her thumb, she is finding the brace to be too bulky and is unable to type.  Do we have any other options?

## 2022-11-01 NOTE — Telephone Encounter (Signed)
Called and spoke with patient and gave Dr. Jackson's recommendations  

## 2022-11-06 ENCOUNTER — Encounter: Payer: 59 | Admitting: Rehabilitative and Restorative Service Providers"

## 2022-12-06 ENCOUNTER — Encounter (HOSPITAL_BASED_OUTPATIENT_CLINIC_OR_DEPARTMENT_OTHER): Payer: Self-pay | Admitting: *Deleted

## 2023-01-16 ENCOUNTER — Encounter (HOSPITAL_BASED_OUTPATIENT_CLINIC_OR_DEPARTMENT_OTHER): Payer: Self-pay | Admitting: Obstetrics & Gynecology

## 2023-01-16 ENCOUNTER — Other Ambulatory Visit (HOSPITAL_COMMUNITY)
Admission: RE | Admit: 2023-01-16 | Discharge: 2023-01-16 | Disposition: A | Discharge status: Home / Self Care | Payer: 59 | Source: Ambulatory Visit | Attending: Obstetrics & Gynecology | Admitting: Obstetrics & Gynecology

## 2023-01-16 ENCOUNTER — Ambulatory Visit (INDEPENDENT_AMBULATORY_CARE_PROVIDER_SITE_OTHER): Payer: 59 | Admitting: Obstetrics & Gynecology

## 2023-01-16 VITALS — BP 94/60 | HR 72 | Ht 66.0 in | Wt 149.8 lb

## 2023-01-16 DIAGNOSIS — Z124 Encounter for screening for malignant neoplasm of cervix: Secondary | ICD-10-CM | POA: Insufficient documentation

## 2023-01-16 DIAGNOSIS — Z803 Family history of malignant neoplasm of breast: Secondary | ICD-10-CM | POA: Diagnosis not present

## 2023-01-16 DIAGNOSIS — Z780 Asymptomatic menopausal state: Secondary | ICD-10-CM | POA: Diagnosis not present

## 2023-01-16 DIAGNOSIS — M21612 Bunion of left foot: Secondary | ICD-10-CM

## 2023-01-16 DIAGNOSIS — E785 Hyperlipidemia, unspecified: Secondary | ICD-10-CM | POA: Diagnosis not present

## 2023-01-16 DIAGNOSIS — Z658 Other specified problems related to psychosocial circumstances: Secondary | ICD-10-CM

## 2023-01-16 DIAGNOSIS — Z1331 Encounter for screening for depression: Secondary | ICD-10-CM

## 2023-01-16 DIAGNOSIS — Z01419 Encounter for gynecological examination (general) (routine) without abnormal findings: Secondary | ICD-10-CM | POA: Diagnosis not present
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
## 2023-01-17 LAB — COMPREHENSIVE METABOLIC PANEL
ALT: 16 IU/L (ref 0–32)
AST: 20 IU/L (ref 0–40)
Albumin: 4.5 g/dL (ref 3.8–4.9)
Alkaline Phosphatase: 78 IU/L (ref 44–121)
BUN/Creatinine Ratio: 18 (ref 9–23)
BUN: 15 mg/dL (ref 6–24)
Bilirubin Total: 0.3 mg/dL (ref 0.0–1.2)
CO2: 25 mmol/L (ref 20–29)
Calcium: 9.9 mg/dL (ref 8.7–10.2)
Chloride: 102 mmol/L (ref 96–106)
Globulin, Total: 2.6 g/dL (ref 1.5–4.5)
Glucose: 76 mg/dL (ref 70–99)
Potassium: 3.8 mmol/L (ref 3.5–5.2)
Sodium: 139 mmol/L (ref 134–144)
Total Protein: 7.1 g/dL (ref 6.0–8.5)

## 2023-01-17 LAB — LIPID PANEL
Chol/HDL Ratio: 3.8 ratio (ref 0.0–4.4)
Cholesterol, Total: 254 mg/dL — ABNORMAL HIGH (ref 100–199)
HDL: 67 mg/dL (ref >39)
LDL Chol Calc (NIH): 155 mg/dL — ABNORMAL HIGH (ref 0–99)
Triglycerides: 178 mg/dL — ABNORMAL HIGH (ref 0–149)
VLDL Cholesterol Cal: 32 mg/dL (ref 5–40)

## 2023-01-17 LAB — HEMOGLOBIN A1C
Est. average glucose Bld gHb Est-mCnc: 105 mg/dL
Hgb A1c MFr Bld: 5.3 % (ref 4.8–5.6)

## 2023-01-17 NOTE — Addendum Note (Signed)
Addended by: Jerene Bears on: 01/17/2023 05:55 AM   Modules accepted: Orders

## 2023-01-31 ENCOUNTER — Ambulatory Visit (HOSPITAL_BASED_OUTPATIENT_CLINIC_OR_DEPARTMENT_OTHER): Payer: 59 | Admitting: Obstetrics & Gynecology

## 2023-02-04 ENCOUNTER — Ambulatory Visit (INDEPENDENT_AMBULATORY_CARE_PROVIDER_SITE_OTHER): Payer: 59

## 2023-02-04 ENCOUNTER — Ambulatory Visit (INDEPENDENT_AMBULATORY_CARE_PROVIDER_SITE_OTHER): Payer: 59 | Admitting: Podiatry

## 2023-02-04 DIAGNOSIS — M21619 Bunion of unspecified foot: Secondary | ICD-10-CM

## 2023-02-04 DIAGNOSIS — E559 Vitamin D deficiency, unspecified: Secondary | ICD-10-CM | POA: Diagnosis not present

## 2023-02-04 DIAGNOSIS — M21611 Bunion of right foot: Secondary | ICD-10-CM | POA: Diagnosis not present

## 2023-02-04 DIAGNOSIS — M21612 Bunion of left foot: Secondary | ICD-10-CM

## 2023-02-04 NOTE — Progress Notes (Signed)
Subjective:   Patient ID: Kathleen Farley, female   DOB: 57 y.o.   MRN: [id number]   HPI Chief Complaint  Patient presents with   Bunions    LEFT IS MORE PAINFUL THAN RIGHT, PAIN LEVEL 5-8/ 10 GETS WORSE AS THE DAY GOES ON AND THE MORE SHE IS ON HER FEET, WORSE WHEN SHE IS WEARING CLOSED TOE SHOES    57 year old female presents the office with above concerns.  She states that she had the bunions for quite some time she tried changing shoes and this has been limiting for her.  Left side is worse than the right.  She states it hurts more in the winter.  She has noticed her body getting larger over time.  No tobacco use No history of blood clots.   Review of Systems  All other systems reviewed and are negative.   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  Do not use more than 5 days per month. (Patient not taking: Reported on 01/16/2023), Disp: 60 g, Rfl: 0   fluticasone furoate-vilanterol (BREO ELLIPTA) 100-25 MCG/ACT AEPB, TAKE 1 PUFF BY MOUTH EVERY DAY, Disp: 180 each, Rfl: 3   Ibuprofen 200 MG CAPS, ibuprofen 200 mg capsule  Take 1 capsule every 6 hours by oral route. (Patient not taking: Reported on 01/16/2023), Disp: , Rfl:    meloxicam (MOBIC) 15 MG tablet, Take 1 tablet (15 mg total) by mouth daily. (Patient not taking: Reported on 01/16/2023), Disp: 30 tablet, Rfl: 0   methocarbamol (ROBAXIN) 500 MG tablet, Take 1 tablet (500 mg  total) by mouth every 8 (eight) hours as needed for muscle spasms. (Patient not taking: Reported on 01/16/2023), Disp: 45 tablet, Rfl: 0   tirzepatide (MOUNJARO) 2.5 MG/0.5ML Pen, Inject 2.5 mg into the skin once a week., Disp: , Rfl:   No Known Allergies        Objective:  Physical Exam  General: AAO x3, NAD  Dermatological: Skin is warm, dry and supple bilateral. There are no open sores, no preulcerative lesions, no rash or signs of infection present.  Vascular: Dorsalis Pedis artery and Posterior Tibial artery pedal pulses are 2/4 bilateral with immedate capillary fill time.  There is no pain with calf compression, swelling, warmth, erythema.   Neruologic: Grossly intact via light touch bilateral.  Musculoskeletal: Moderate bunions are present.  There is no crepitation with MPJ range of motion.  No hypermobility of the first ray.  Tenderness over the bunion.  No other areas of discomfort.  MMT 5/5.  Gait: Unassisted, Nonantalgic.       Assessment:   57 year old female with symptomatic bunion     Plan:  -Treatment options discussed including all alternatives, risks, and complications -Etiology of symptoms were discussed -X-rays were obtained and reviewed with the patient.  3 views of the feet were obtained.  No subacute  fracture.  Moderate increase in first intermetatarsal angle noted.  IMA of 15 degrees. -We discussed the conservative as well as surgical options. -Discussed both conservative as well as surgical treatment options.  She is attempted conservative treatments without significant improvement.  Discussed surgical intervention including first metatarsal osteotomy.  After discussion she wished to proceed with surgery. -The incision placement as well as the postoperative course was discussed with the patient. I discussed risks of the surgery which include, but not limited to, infection, bleeding, pain, swelling, need for further surgery, delayed or nonhealing, painful or  ugly scar, numbness or sensation changes, over/under correction, recurrence, transfer lesions, further deformity, hardware failure, DVT/PE, loss of toe/foot. Patient understands these risks and wishes to proceed with surgery. The surgical consent was reviewed with the patient all 3 pages were signed. No promises or guarantees were given to the outcome of the procedure. All questions were answered to the best of my ability. Before the surgery the patient was encouraged to call the office if there is any further questions. The surgery will be performed at the [facility] on an outpatient basis. -Vitamin D check   Vivi Barrack DPM

## 2023-02-04 NOTE — Patient Instructions (Signed)

## 2023-03-15 ENCOUNTER — Ambulatory Visit (INDEPENDENT_AMBULATORY_CARE_PROVIDER_SITE_OTHER): Payer: 59 | Admitting: Family Medicine

## 2023-03-15 ENCOUNTER — Encounter: Payer: Self-pay | Admitting: Family Medicine

## 2023-03-15 VITALS — BP 120/80 | HR 76 | Temp 98.1°F | Resp 12 | Ht 66.0 in | Wt 155.0 lb

## 2023-03-15 DIAGNOSIS — M545 Low back pain, unspecified: Secondary | ICD-10-CM | POA: Diagnosis not present

## 2023-03-15 DIAGNOSIS — Z23 Encounter for immunization: Secondary | ICD-10-CM | POA: Diagnosis not present

## 2023-03-15 DIAGNOSIS — R3129 Other microscopic hematuria: Secondary | ICD-10-CM | POA: Diagnosis not present

## 2023-03-15 LAB — POC URINALSYSI DIPSTICK (AUTOMATED)
Bilirubin, UA: NEGATIVE
Blood, UA: POSITIVE
Glucose, UA: NEGATIVE
Ketones, UA: NEGATIVE
Leukocytes, UA: NEGATIVE
Nitrite, UA: NEGATIVE
Protein, UA: NEGATIVE
Spec Grav, UA: 1.025 (ref 1.010–1.025)
Urobilinogen, UA: 0.2 E.U./dL
pH, UA: 5 (ref 5.0–8.0)

## 2023-03-15 MED ORDER — METHOCARBAMOL 500 MG PO TABS: 500.0000 mg | ORAL_TABLET | Freq: Three times a day (TID) | ORAL | 0 refills | Status: DC | PRN

## 2023-03-15 MED ORDER — CELECOXIB 100 MG PO CAPS
100.0000 mg | ORAL_CAPSULE | Freq: Two times a day (BID) | ORAL | 0 refills | Status: AC
Start: 2023-03-15 — End: 2023-03-25

## 2023-03-15 NOTE — Progress Notes (Signed)
ACUTE VISIT Chief Complaint  Patient presents with   Flank Pain    X a week on the right side, pain is constant. Worse when her back is resting against something.    HPI: [name] is a 57 y.o. female with a PMHx significant for HLD, anxiety, vitamin D deficiency, and neck pain here today complaining of right flank/low back pain as described above. Hx of low back pain but states that this pain is new.  Back Pain This is a new problem. The current episode started in the past 7 days. The problem occurs constantly. The problem is unchanged. The pain does not radiate. The pain is moderate. The symptoms are aggravated by position and standing. Pertinent negatives include no abdominal pain, bladder incontinence, bowel incontinence, chest pain, dysuria, headaches, leg pain, numbness, pelvic pain, perianal numbness, weakness or weight loss. Risk factors include menopause. She has tried nothing for the symptoms.  She rates the pain as 7/10.  She states the pain has been constant, but worsens when she leans on something or with palpation. .  Chronic bilateral back pain exacerbated by standing for long periods.  She denies radiation to her abdomen or leg. No associated fever, chills, nausea, vomiting, changes in bowel movements,gross hematuria, or recent trauma/unusual activities.   Review of Systems  Constitutional:  Negative for activity change, appetite change and weight loss.  Respiratory:  Negative for cough, shortness of breath and wheezing.   Cardiovascular:  Negative for chest pain.  Gastrointestinal:  Negative for abdominal pain and bowel incontinence.  Genitourinary:  Negative for bladder incontinence, decreased urine volume, dysuria, frequency and pelvic pain.  Musculoskeletal:  Positive for back pain.  Skin:  Negative for rash.  Neurological:  Negative for weakness, numbness and headaches.  See other pertinent positives and negatives in HPI.  Current Outpatient  Medications on File Prior to Visit  Medication Sig Dispense Refill   albuterol (VENTOLIN HFA) 108 (90 Base) MCG/ACT inhaler USE 2 PUFFS EVERY 4 HOURS AS NEEDED FOR WHEEZING 1 each 1   cetirizine (ZYRTEC) 10 MG tablet Take by mouth.     clobetasol ointment (TEMOVATE) 0.05 % Apply as directed twice daily for up to 5 days.  Do not use more than 5 days per month. 60 g 0   fluticasone furoate-vilanterol (BREO ELLIPTA) 100-25 MCG/ACT AEPB TAKE 1 PUFF BY MOUTH EVERY DAY 180 each 3   tirzepatide (MOUNJARO) 2.5 MG/0.5ML Pen Inject 2.5 mg into the skin once a week.     No current facility-administered medications on file prior to visit.    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   No Known Allergies  Social History   Socioeconomic History   Marital status: Married    Spouse name: Not on file   Number of children: 2   Years of education: Not on file   Highest education level: Not on file  Occupational History    Employer: [employer]  Tobacco Use   Smoking status: Never   Smokeless tobacco: Never  Vaping Use   Vaping status: Never Used  Substance and Sexual Activity   Alcohol use: Not Currently    Alcohol/week: 0.0 - 1.0 standard drinks of alcohol   Drug use: No   Sexual activity: Not Currently    Partners: Male    Birth control/protection: Post-menopausal  Other Topics Concern   Not on file  Social History Narrative  Not on file   Social Determinants of Health   Financial Resource Strain: Not on file  Food Insecurity: Not on file  Transportation Needs: Not on file  Physical Activity: Not on file  Stress: Not on file  Social Connections: Not on file    Vitals:   03/15/23 1440  BP: 120/80  Pulse: 76  Resp: 12  Temp: 98.1 F (36.7 C)  SpO2: 98%   Body mass index is 25.02 kg/m.  Physical Exam Vitals and nursing note reviewed.  Constitutional:      General: She is not in acute distress.    Appearance:  She is well-developed.  HENT:     Head: Normocephalic and atraumatic.  Eyes:     Conjunctiva/sclera: Conjunctivae normal.  Cardiovascular:     Rate and Rhythm: Normal rate and regular rhythm.     Heart sounds: No murmur heard. Pulmonary:     Effort: Pulmonary effort is normal. No respiratory distress.     Breath sounds: Normal breath sounds.  Abdominal:     Palpations: Abdomen is soft. There is no hepatomegaly or mass.     Tenderness: There is no abdominal tenderness.  Musculoskeletal:     Lumbar back: Tenderness present. Negative right straight leg raise test and negative left straight leg raise test.       Back:     Right lower leg: No edema.     Left lower leg: No edema.  Skin:    General: Skin is warm.     Findings: No erythema or rash.  Neurological:     General: No focal deficit present.     Mental Status: She is alert and oriented to person, place, and time.     Gait: Gait normal.  Psychiatric:        Mood and Affect: Mood and affect normal.    ASSESSMENT AND PLAN:  [name] was seen today for right flank pain.   Acute right-sided low back pain without sciatica We discussed differential Dx, hx suggest a musculoskeletal problem. I do not think imaging is needed today. Recommend 7-10 days of Celebrex and Methocarbamol, the latter one she has taken in the past for neck pain. Still having chronic bilateral lower back pain, interested in trying PT, referral placed. Monitor for new symptoms. F/U as needed.  -     Celecoxib; Take 1 capsule (100 mg total) by mouth 2 (two) times daily for 10 days.  Dispense: 20 capsule; Refill: 0 -     Methocarbamol; Take 1 tablet (500 mg total) by mouth every 8 (eight) hours as needed for muscle spasms.  Dispense: 45 tablet; Refill: 0 -     Ambulatory referral to Physical Therapy -     POCT Urinalysis Dipstick (Automated)  Need for influenza vaccination -     Flu vaccine trivalent PF, 6mos and  older(Flulaval,Afluria,Fluarix,Fluzone)  Microscopic hematuria Urine dipstick positive for blood. No hx of nephrolithiasis. Urine sent for microscopic exam, further recommendations will be given according to results.  -     Urine Microscopic; Future  Return if symptoms worsen or fail to improve, for keep next appointment.  I, Rolla Etienne Wierda, acting as a scribe for Suetta Hoffmeister [place], MD., have documented all relevant documentation on the behalf of Thi Klich [place], MD, as directed by  Shere Eisenhart [place], MD while in the presence of Meleni Delahunt [place], MD.   I, Shylin Keizer [place], MD, have reviewed all documentation for this visit. The documentation on 03/15/23 for the exam, diagnosis, procedures,  and orders are all accurate and complete.  Arlet Marter G. [place], MD  [facility]. [place] office.

## 2023-03-15 NOTE — Patient Instructions (Addendum)
A few things to remember from today's visit:  Acute right-sided low back pain without sciatica - Plan: Urinalysis, Routine w reflex microscopic, celecoxib (CELEBREX) 100 MG capsule, methocarbamol (ROBAXIN) 500 MG tablet, Ambulatory referral to Physical Therapy  It seems muscular pain. Methocarbamol causes drowsiness, take it at bedtime. PT will be arranged.  If you need refills for medications you take chronically, please call your pharmacy. Do not use My Chart to request refills or for acute issues that need immediate attention. If you send a my chart message, it may take a few days to be addressed, specially if I am not in the office.  Please be sure medication list is accurate. If a new problem present, please set up appointment sooner than planned today.

## 2023-03-25 ENCOUNTER — Encounter (HOSPITAL_BASED_OUTPATIENT_CLINIC_OR_DEPARTMENT_OTHER): Payer: Self-pay | Admitting: Obstetrics & Gynecology

## 2023-03-26 ENCOUNTER — Other Ambulatory Visit (HOSPITAL_BASED_OUTPATIENT_CLINIC_OR_DEPARTMENT_OTHER): Payer: Self-pay | Admitting: Obstetrics & Gynecology

## 2023-03-26 DIAGNOSIS — Z658 Other specified problems related to psychosocial circumstances: Secondary | ICD-10-CM

## 2023-04-01 NOTE — Therapy (Unsigned)
OUTPATIENT PHYSICAL THERAPY THORACOLUMBAR EVALUATION   Patient Name: Kathleen Farley MRN: [id number] DOB:1966-05-25, 57 y.o., female Today's Date: 04/02/2023  END OF SESSION:  PT End of Session - 04/02/23 1428     Visit Number 1    Number of Visits 12    Date for PT Re-Evaluation 05/14/23    Authorization Type [employer]    PT Start Time 1233    PT Stop Time 1317    PT Time Calculation (min) 44 min    Activity Tolerance Patient tolerated treatment well    Behavior During Therapy [facility] for tasks assessed/performed             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   Past Surgical History:  Procedure Laterality Date   BREAST LUMPECTOMY Right 1994   BREAST LUMPECTOMY  07/2007   papilloma   SALPINGOOPHORECTOMY Right 2004   endometriosis   Patient Active Problem List   Diagnosis Date Noted   Cervicalgia 09/17/2022   Family history of breast cancer 12/23/2020   Postmenopausal 12/23/2020   Vitamin D deficiency, unspecified 12/02/2018   Mixed hyperlipidemia 12/02/2018   Seasonal and perennial allergic rhinoconjunctivitis 11/03/2018   Asthma, well controlled, moderate persistent 11/03/2018   Asthma, cold induced, moderate persistent, with status asthmaticus 09/03/2018   Anxiety state 07/24/2013   History of migraine headaches 02/20/2012    PCP: [place], Betty G, MD  REFERRING PROVIDER: [place], Betty G, MD  REFERRING DIAG: Acute Rt sided low back pain   Rationale for Evaluation and Treatment: Rehabilitation  THERAPY DIAG:  Other low back pain  Abnormal posture  ONSET DATE: 1 month   SUBJECTIVE:                                                                                                                                                                                           SUBJECTIVE STATEMENT: I always have upper back and neck tension but now its coming down to my low back. I used to not be able to even lie  on my back when I saw dr. [place]. The pain improved with the medicine, but I still have pain, sometimes severe in rt > lt. Side of low back.  Pt has difficulty standing (cook 1-2 hours) I can walk but walking does not ease the pain. Sitting/lying down eases pain . Pain radiates to her Rt post thigh but not below the knee.  Numb/tingling in Rt LE, only min. "I think it is muscle pain". Pt also relays increased stress lately, work related. She is used to  doing  Pilates daily, yoga every day and walk 2-3 x per week. Since a 2 week trip to [place] she has done very little formal exercise. 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  PERTINENT HISTORY:   [place], asthma, PT for upper back pain in 2019, 3 visits.   MD note: This is a new problem. The current episode started in the past 7 days. The problem occurs constantly. The problem is unchanged. The pain does not radiate. The pain is moderate. The symptoms are aggravated by position and standing. Pertinent negatives include no abdominal pain, bladder incontinence, bowel incontinence, chest pain, dysuria, headaches, leg pain, numbness, pelvic pain, perianal numbness, weakness or weight loss. Risk factors include menopause. She has tried nothing for the symptoms.  She rates the pain as 7/10.  She states the pain has been constant, but worsens when she leans on something or with palpation.  PAIN:  Are you having pain? Yes: NPRS scale: 4/10 Pain location: Right side > left side lumbar to rR post hip.  Pain description: Tight sore Aggravating factors: Standing in 1 place for too long, patient is also uncomfortable sitting with full weightbearing on right hip Relieving factors: Leaning to the left in sitting, sitting in general will reduce pain if she is standing too long, moving around Pain can be 10/10 at times   PRECAUTIONS: None  RED FLAGS: None   WEIGHT BEARING RESTRICTIONS: No  FALLS:  Has  patient fallen in last 6 months? 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  DIAGNOSTIC FINDINGS:  None recent   PATIENT SURVEYS:  FOTO 51%  SCREENING FOR RED FLAGS: Bowel or bladder incontinence: No Spinal tumors: No Cauda equina syndrome: No Compression fracture: No Abdominal aneurysm: No  COGNITION: Overall cognitive status: Within functional limits for tasks assessed     SENSATION: WFL she reports very minimal sensory changes in the right lower extremity  MUSCLE LENGTH: Hamstrings: WNL  Thomas test: NT  POSTURE: Increased thoracic kyphosis, forward head posture , posterior pelvic tilts  PALPATION: Pain and tenderness, spasm present with palpation in prone to right upper lumbar paraspinals extending to superior glutes in addition to the left paraspinals.  LUMBAR ROM:   AROM eval  Flexion WNL tight post thighs   Extension Min pain, central lumbar, 50% limited  Right lateral flexion WNL   Left lateral flexion WNL , Pain on Rt side  Right rotation WNL  Left rotation WNL   Combo ext and sidebending incr pain on RIGHT when moving to L    LOWER EXTREMITY ROM:   WNLs   Passive  Right eval Left eval  Hip flexion    Hip extension    Hip abduction    Hip adduction    Hip internal rotation    Hip external rotation    Knee flexion    Knee extension    Ankle dorsiflexion    Ankle plantarflexion    Ankle inversion    Ankle eversion     (Blank rows = not tested)  LOWER EXTREMITY MMT:    MMT Right eval  Left eval  Hip flexion 5 5  Hip extension    Hip abduction 4 pain 4+ pain   Hip adduction    Hip internal rotation    Hip external rotation    Knee flexion 4 pain  4+ pain   Knee extension 5 5  Ankle dorsiflexion 5 5  Ankle plantarflexion    Ankle inversion    Ankle eversion     (Blank rows = not tested)  LUMBAR SPECIAL TESTS:  Straight leg raise test: neg, pain end range Passive SLR , Trendelenburg sign: Negative, and pain increased with supine full body extension reduced when knees were bent.  *Long axis distraction of each leg provided full pain relief  FUNCTIONAL TESTS:  5 times sit to stand: NT   SLS increased effort on Rt LE but can do this 20-30 sec  GAIT: Distance walked: 150 Assistive device utilized: None Level of assistance: Complete Independence Comments: ne deviations   TODAY'S TREATMENT:                                                                                                                              DATE: 04/02/23    PATIENT EDUCATION:  Education details: PT, HEP, core, directional preference, disc health and pilates  Person educated: Patient Education method: Explanation, Demonstration, Verbal cues, and Handouts Education comprehension: verbalized understanding, returned demonstration, and needs further education  HOME EXERCISE PROGRAM: Access Code: [email or web address] URL: [email or web address] Date: 04/02/2023 Prepared by: Karie Mainland  Exercises - Prone Press Up On Elbows  - 2-3 x daily - 7 x weekly - 2 sets - 10 reps - 10 hold - Standing 'L' Stretch at Counter  - 2-3 x daily - 7 x weekly - 1 sets - 3-5 reps - 30 hold - Child's Pose Stretch  - 1 x daily - 7 x weekly - 1 sets - 3-5 reps - 30 hold - Supine Posterior Pelvic Tilt  - 2-3 x daily - 7 x weekly - 2 sets - 10 reps - 5 hold  ASSESSMENT:  CLINICAL IMPRESSION: Patient is a 57 y.o. female who was seen today for physical therapy evaluation and treatment for low back pain with radiation to Rt proximal LE.  She showed no directional bias with respect to  movement and that prone did decrease some of her low back discomfort however overall she prefers sitting and flexion in the hook lying position.  Suspect a degenerative disc issue with an idiopathic exacerbation.  Patient will benefit from physical therapy to address functional deficits mentioned.    OBJECTIVE IMPAIRMENTS: decreased mobility, decreased ROM, decreased strength, increased fascial restrictions, increased muscle spasms, impaired flexibility, impaired sensation, postural dysfunction, and pain.   ACTIVITY LIMITATIONS: carrying, lifting, bending, sitting, standing, squatting, and locomotion level  PARTICIPATION LIMITATIONS: meal prep, laundry, community activity, and occupation  PERSONAL FACTORS: Profession and 1 comorbidity: history of upper back pain  are also affecting patient's functional outcome.   REHAB POTENTIAL:  Excellent  CLINICAL DECISION MAKING: Evolving/moderate complexity  EVALUATION COMPLEXITY: Moderate   GOALS: Goals reviewed with patient? Yes  SHORT TERM GOALS: Target date: 04/23/2023    Pt will be I with initial HEP  Baseline: Goal status: INITIAL  2.  Pt will report improved ability to stand for ADLs, cook up to 30 min  Baseline:  Goal status: INITIAL  3.  Pt will understand stability and exercises to complete Pilates Reformer exercises without increasing pain  Baseline:  Goal status: INITIAL  4.  Patient will make efforts to stand up each hour for stretch breaks Baseline:  Goal status: INITIAL   LONG TERM GOALS: Target date: 05/28/2023    Patient will be independent with home exercise program upon discharge Baseline:  Goal status: INITIAL  2.  Patient patient will no longer have radiating pain to lower extremities Baseline:  Goal status: INITIAL  3.  Patient will be able to stand to cook and do basic home tasks include vacuuming with no more than minimal pain  in lower back for up to 1 hour Baseline:  Goal status: INITIAL  4.  FOTO  score will improve to 69% or better to demo improved functional mobility  Baseline:  Goal status: INITIAL  5.  Pt will resume Pilates group classes without pain exacerbation 2 times per week Baseline:  Goal status: INITIAL  PLAN:  PT FREQUENCY: 2x/week  PT DURATION: 8 weeks6-8 weeks   PLANNED INTERVENTIONS: Therapeutic exercises, Therapeutic activity, Neuromuscular re-education, Balance training, Patient/Family education, Self Care, Joint mobilization, Dry Needling, Electrical stimulation, Spinal mobilization, Cryotherapy, Moist heat, Traction, Manual therapy, Re-evaluation, and pilates based PT  .  PLAN FOR NEXT SESSION: HEP, manual (traction, LAD) and consider Tr P DN . Ext vs flexion    Analyn Matusek, PT 04/02/2023, 2:29 PM   Karie Mainland, PT 04/02/23 5:58 PM Phone: [phone] Fax: [phone]

## 2023-04-02 ENCOUNTER — Ambulatory Visit: Payer: 59 | Attending: Family Medicine | Admitting: Physical Therapy

## 2023-04-02 ENCOUNTER — Encounter: Payer: Self-pay | Admitting: Physical Therapy

## 2023-04-02 DIAGNOSIS — R293 Abnormal posture: Secondary | ICD-10-CM | POA: Insufficient documentation

## 2023-04-02 DIAGNOSIS — R252 Cramp and spasm: Secondary | ICD-10-CM | POA: Insufficient documentation

## 2023-04-02 DIAGNOSIS — M5459 Other low back pain: Secondary | ICD-10-CM | POA: Diagnosis present

## 2023-04-05 ENCOUNTER — Encounter: Payer: Self-pay | Admitting: Physical Therapy

## 2023-04-08 ENCOUNTER — Ambulatory Visit: Payer: 59 | Admitting: Physical Therapy

## 2023-04-08 ENCOUNTER — Encounter: Payer: Self-pay | Admitting: Physical Therapy

## 2023-04-08 DIAGNOSIS — R252 Cramp and spasm: Secondary | ICD-10-CM

## 2023-04-08 DIAGNOSIS — M5459 Other low back pain: Secondary | ICD-10-CM | POA: Diagnosis not present

## 2023-04-08 DIAGNOSIS — R293 Abnormal posture: Secondary | ICD-10-CM

## 2023-04-08 NOTE — Therapy (Signed)
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   Past Surgical History:  Procedure Laterality Date   BREAST LUMPECTOMY Right 1994   BREAST LUMPECTOMY  07/2007   papilloma   SALPINGOOPHORECTOMY Right 2004   endometriosis   Patient Active Problem List   Diagnosis Date Noted   Cervicalgia 09/17/2022   Family history of breast cancer 12/23/2020   Postmenopausal 12/23/2020   Vitamin D deficiency, unspecified 12/02/2018   Mixed hyperlipidemia 12/02/2018   Seasonal and perennial allergic rhinoconjunctivitis 11/03/2018   Asthma, well controlled, moderate persistent 11/03/2018   Asthma, cold induced, moderate persistent, with status asthmaticus 09/03/2018   Anxiety state 07/24/2013   History of migraine headaches 02/20/2012    PCP: [place], Betty G, MD  REFERRING PROVIDER: [place], Betty G, MD  REFERRING DIAG: Acute Rt sided low back pain   Rationale for Evaluation and Treatment: Rehabilitation  THERAPY DIAG:  Other low back pain  Abnormal posture  Cramp and spasm  ONSET DATE: 1 month   SUBJECTIVE:                                                                                                                                                                                           SUBJECTIVE STATEMENT: Pt is sore today.  Walked 6 miles and also did a Pilates class level 1 on the same day.  Also cleaned my house.  That was hard.  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    EVAL: I always have upper back and neck tension but now its coming down to my low back. I used to not be able to even lie on my back when I saw dr. [place]. The pain improved with the medicine, but I still have pain, sometimes severe in rt > lt. Side of low back.  Pt has difficulty standing (cook 1-2 hours) I can walk but walking does not ease the pain.  Sitting/lying down eases pain . Pain radiates to her Rt post thigh but not below the knee.  Numb/tingling in Rt LE, only min. "I think it is muscle pain". Pt also relays increased stress lately, work related. She is used to doing  Pilates daily, yoga every day and walk 2-3 x per week. Since a 2 week trip to [place] she has done very little formal exercise. 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  PERTINENT HISTORY:   [place], asthma, PT for upper back pain in 2019, 3 visits.   MD note: This is a new problem. The current episode started in the past 7 days. The problem occurs constantly. The problem is unchanged. The pain does not radiate. The pain is moderate. The symptoms are aggravated by position and standing. Pertinent negatives include no abdominal pain, bladder incontinence, bowel incontinence, chest pain, dysuria, headaches, leg pain, numbness, pelvic pain, perianal numbness, weakness or weight loss. Risk factors include menopause. She has tried nothing for the symptoms.  She rates the pain as 7/10.  She states the pain has been constant, but worsens when she leans on something or with palpation.  PAIN:  Are you having pain? Yes: NPRS scale: 5/10 Pain location: Right side > left side lumbar to rR post hip.  Pain description: Tight sore Aggravating factors: Standing in 1 place for too long, patient is also uncomfortable sitting with full weightbearing on right hip Relieving factors: Leaning to the left in sitting, sitting in general will reduce pain  if she is standing too long, moving around Pain can be 10/10 at times   PRECAUTIONS: None  RED FLAGS: None   WEIGHT BEARING RESTRICTIONS: No  FALLS:  Has patient fallen in last 6 months? 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  DIAGNOSTIC FINDINGS:  None recent   PATIENT SURVEYS:  FOTO 51%  SCREENING FOR RED FLAGS: Bowel or bladder incontinence: No Spinal tumors: No Cauda equina syndrome: No Compression fracture: No Abdominal aneurysm: No  COGNITION: Overall cognitive status: Within functional limits for tasks assessed     SENSATION: WFL she reports very minimal sensory changes in the right lower extremity  MUSCLE LENGTH: Hamstrings: WNL  Thomas test: NT  POSTURE: Increased thoracic kyphosis, forward head posture , posterior pelvic tilts  PALPATION: Pain and tenderness, spasm present with palpation in prone to right upper lumbar paraspinals extending to superior glutes in addition to the left paraspinals.  LUMBAR ROM:   AROM eval  Flexion WNL tight post thighs   Extension Min pain, central lumbar, 50% limited  Right lateral flexion WNL   Left lateral flexion WNL , Pain on Rt side  Right rotation WNL  Left rotation WNL   Combo ext and sidebending incr pain on RIGHT when moving to L    LOWER EXTREMITY ROM:   WNLs   Passive  Right eval Left eval  Hip flexion    Hip extension    Hip abduction    Hip adduction    Hip internal rotation    Hip external rotation    Knee flexion  Knee extension    Ankle dorsiflexion    Ankle plantarflexion    Ankle inversion    Ankle  eversion     (Blank rows = not tested)  LOWER EXTREMITY MMT:    MMT Right eval Left eval  Hip flexion 5 5  Hip extension    Hip abduction 4 pain 4+ pain  Hip adduction    Hip internal rotation    Hip external rotation    Knee flexion 4 pain  4+ pain   Knee extension 5 5  Ankle dorsiflexion 5 5  Ankle plantarflexion    Ankle inversion    Ankle eversion     (Blank rows = not tested)  LUMBAR SPECIAL TESTS:  Straight leg raise test: neg, pain end range Passive SLR , Trendelenburg sign: Negative, and pain increased with supine full body extension reduced when knees were bent.  *Long axis distraction of each leg provided full pain relief  FUNCTIONAL TESTS:  5 times sit to stand: NT   SLS increased effort on Rt LE but can do this 20-30 sec  GAIT: Distance walked: 150 Assistive device utilized: None Level of assistance: Complete Independence Comments: ne deviations   TODAY'S TREATMENT:                 [facility] Adult PT Treatment:                                                DATE: 04/08/23 Therapeutic Exercise: Supine A/P tilt Knee to chest each side x 2  Rt knee extension thigh supported.  Hamstring and ITB/Adductor with strap Ball under pelvis for lower core stabilization Clam, single leg knee extension 90/90 hold and reverse toe taps  LTR Bridge with ball between knees (articulating)   Hip abduction x 15  ER/IR in sidelying x  15  Manual Therapy: Sidelying quadratus lumborum (QL) stretch, manual pressure to elongate Rt side Trigger point release to Rt QL                                                                                                                    PATIENT EDUCATION:  Education details: PT, HEP, core, directional preference, disc health and pilates  Person educated: Patient Education method: Explanation, Demonstration, Verbal cues, and Handouts Education comprehension: verbalized understanding, returned demonstration, and needs further  education  HOME EXERCISE PROGRAM: Access Code: [email or web address] URL: [email or web address] Date: 04/02/2023 Prepared by: Karie Mainland  Exercises - Prone Press Up On Elbows  - 2-3 x daily - 7 x weekly - 2 sets - 10 reps - 10 hold - Standing 'L' Stretch at Counter  - 2-3 x daily - 7 x weekly - 1 sets - 3-5 reps - 30 hold - Child's Pose Stretch  - 1 x daily - 7 x weekly - 1 sets - 3-5 reps - 30 hold - Supine  Posterior Pelvic Tilt  - 2-3 x daily - 7 x weekly - 2 sets - 10 reps - 5 hold  ASSESSMENT:  CLINICAL IMPRESSION: Patient reports increased pain today in low back and Rt LE. She did participate in Pilates and was more active this weekend.  She respondd well to neutral core stability exercises and manual therapy to Rt QL .  Less pain upon standing.  She will benefit from skilled PT to include dry needling to QL.   OBJECTIVE IMPAIRMENTS: decreased mobility, decreased ROM, decreased strength, increased fascial restrictions, increased muscle spasms, impaired flexibility, impaired sensation, postural dysfunction, and pain.   ACTIVITY LIMITATIONS: carrying, lifting, bending, sitting, standing, squatting, and locomotion level  PARTICIPATION LIMITATIONS: meal prep, laundry, community activity, and occupation  PERSONAL FACTORS: Profession and 1 comorbidity: history of upper back pain  are also affecting patient's functional outcome.   REHAB POTENTIAL: Excellent  CLINICAL DECISION MAKING: Evolving/moderate complexity  EVALUATION COMPLEXITY: Moderate   GOALS: Goals reviewed with patient? Yes  SHORT TERM GOALS: Target date: 04/23/2023    Pt will be I with initial HEP  Baseline: Goal status: INITIAL  2.  Pt will report improved ability to stand for ADLs, cook up to 30 min  Baseline:  Goal status: INITIAL  3.  Pt will understand stability and exercises to complete Pilates Reformer exercises without increasing pain  Baseline:  Goal status: INITIAL  4.  Patient will make  efforts to stand up each hour for stretch breaks Baseline:  Goal status: INITIAL   LONG TERM GOALS: Target date: 05/28/2023    Patient will be independent with home exercise program upon discharge Baseline:  Goal status: INITIAL  2.  Patient patient will no longer have radiating pain to lower extremities Baseline:  Goal status: INITIAL  3.  Patient will be able to stand to cook and do basic home tasks include vacuuming with no more than minimal pain  in lower back for up to 1 hour Baseline:  Goal status: INITIAL  4.  FOTO score will improve to 69% or better to demo improved functional mobility  Baseline:  Goal status: INITIAL  5.  Pt will resume Pilates group classes without pain exacerbation 2 times per week Baseline:  Goal status: INITIAL  PLAN:  PT FREQUENCY: 2x/week  PT DURATION: 8 weeks6-8 weeks   PLANNED INTERVENTIONS: Therapeutic exercises, Therapeutic activity, Neuromuscular re-education, Balance training, Patient/Family education, Self Care, Joint mobilization, Dry Needling, Electrical stimulation, Spinal mobilization, Cryotherapy, Moist heat, Traction, Manual therapy, Re-evaluation, and pilates based PT  .   PLAN FOR NEXT SESSION: HEP, manual (traction, LAD) and consider Tr P DN . Ext vs flexion    Doran Nestle, PT 04/08/2023, 1:45 PM   Karie Mainland, PT 04/08/23 1:45 PM Phone: [phone] Fax: [phone]

## 2023-04-09 ENCOUNTER — Ambulatory Visit (INDEPENDENT_AMBULATORY_CARE_PROVIDER_SITE_OTHER): Payer: Self-pay | Admitting: Clinical

## 2023-04-09 DIAGNOSIS — F4310 Post-traumatic stress disorder, unspecified: Secondary | ICD-10-CM

## 2023-04-09 NOTE — Patient Instructions (Signed)
[facility] [address] [place], [place] [address] [phone] (main office) [phone] (Aquan Kope's office)   
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
## 2023-04-11 ENCOUNTER — Encounter: Payer: Self-pay | Admitting: Physical Therapy

## 2023-04-11 ENCOUNTER — Ambulatory Visit: Payer: 59 | Admitting: Physical Therapy

## 2023-04-11 DIAGNOSIS — M5459 Other low back pain: Secondary | ICD-10-CM | POA: Diagnosis not present

## 2023-04-11 DIAGNOSIS — R252 Cramp and spasm: Secondary | ICD-10-CM

## 2023-04-11 DIAGNOSIS — R293 Abnormal posture: Secondary | ICD-10-CM

## 2023-04-11 NOTE — Therapy (Signed)
OUTPATIENT PHYSICAL THERAPY NOTE   Patient Name: Kathleen Farley MRN: [id number] DOB:February 27, 1966, 57 y.o., female Today's Date: 04/11/2023  END OF SESSION:  PT End of Session - 04/11/23 1130     Visit Number 3    Number of Visits 12    Date for PT Re-Evaluation 05/28/23    Authorization Type [employer]    PT Start Time 1130    PT Stop Time 1210    PT Time Calculation (min) 40 min    Activity Tolerance Patient tolerated treatment well    Behavior During Therapy [facility] for tasks assessed/performed              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   Past Surgical History:  Procedure Laterality Date   BREAST LUMPECTOMY Right 1994   BREAST LUMPECTOMY  07/2007   papilloma   SALPINGOOPHORECTOMY Right 2004   endometriosis   Patient Active Problem List   Diagnosis Date Noted   Cervicalgia 09/17/2022   Family history of breast cancer 12/23/2020   Postmenopausal 12/23/2020   Vitamin D deficiency, unspecified 12/02/2018   Mixed hyperlipidemia 12/02/2018   Seasonal and perennial allergic rhinoconjunctivitis 11/03/2018   Asthma, well controlled, moderate persistent 11/03/2018   Asthma, cold induced, moderate persistent, with status asthmaticus 09/03/2018   Anxiety state 07/24/2013   History of migraine headaches 02/20/2012    PCP: [place], Betty G, MD  REFERRING PROVIDER: [place], Betty G, MD  REFERRING DIAG: Acute Rt sided low back pain   Rationale for Evaluation and Treatment: Rehabilitation  THERAPY DIAG:  Other low back pain  Abnormal posture  Cramp and spasm  ONSET DATE: 1 month   SUBJECTIVE:                                                                                                                                                                                           SUBJECTIVE STATEMENT: Pt reports some improvement in her back pain since last session.   She continues to endorse 5/10 shoulder (UT)  and 3/10 low back pain.    EVAL: I always have upper back and neck tension but now its coming down to my low back. I used to not be able to even lie on my back when I saw dr. [place]. The pain improved with the medicine, but I still have pain, sometimes severe in rt > lt. Side of low back.  Pt has difficulty standing (cook 1-2 hours) I can walk but walking does not ease the pain. Sitting/lying down eases pain . Pain radiates to her  Rt post thigh but not below the knee.  Numb/tingling in Rt LE, only min. "I think it is muscle pain". Pt also relays increased stress lately, work related. She is used to doing  Pilates daily, yoga every day and walk 2-3 x per week. Since a 2 week trip to [place] she has done very little formal exercise. 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  PERTINENT HISTORY:   [place], asthma, PT for upper back pain in 2019, 3 visits.   MD note: This is a new problem. The current episode started in the past 7 days. The problem occurs constantly. The problem is unchanged. The pain does not radiate. The pain is moderate. The symptoms are aggravated by position and standing. Pertinent negatives include no abdominal pain, bladder incontinence, bowel incontinence, chest pain, dysuria, headaches, leg pain, numbness, pelvic pain, perianal numbness, weakness or weight loss. Risk factors include menopause. She has tried nothing for the symptoms.  She rates the pain as 7/10.  She states the pain has been constant, but worsens when she leans on something or with palpation.  PAIN:  Are you having pain? Yes: NPRS scale: 5/10 Pain location: Right side > left side lumbar to rR post hip.  Pain description: Tight sore Aggravating factors: Standing in 1 place for too long, patient is also uncomfortable sitting with full weightbearing on right hip Relieving factors: Leaning to the left in sitting, sitting in general will reduce pain if she is standing  too long, moving around Pain can be 10/10 at times   PRECAUTIONS: None  RED FLAGS: None   WEIGHT BEARING RESTRICTIONS: No  FALLS:  Has patient fallen in last 6 months? 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  DIAGNOSTIC FINDINGS:  None recent   PATIENT SURVEYS:  FOTO 51%  SCREENING FOR RED FLAGS: Bowel or bladder incontinence: No Spinal tumors: No Cauda equina syndrome: No Compression fracture: No Abdominal aneurysm: No  COGNITION: Overall cognitive status: Within functional limits for tasks assessed     SENSATION: WFL she reports very minimal sensory changes in the right lower extremity  MUSCLE LENGTH: Hamstrings: WNL  Thomas test: NT  POSTURE: Increased thoracic kyphosis, forward head posture , posterior pelvic tilts  PALPATION: Pain and tenderness, spasm present with palpation in prone to right upper lumbar paraspinals extending to superior glutes in addition to the left paraspinals.  LUMBAR ROM:   AROM eval  Flexion WNL tight post thighs   Extension Min pain, central lumbar, 50% limited  Right lateral flexion WNL   Left lateral flexion WNL , Pain on Rt side  Right rotation WNL  Left rotation WNL   Combo ext and sidebending incr pain on RIGHT when moving to L    LOWER EXTREMITY ROM:   WNLs   Passive  Right eval Left eval  Hip flexion    Hip extension    Hip abduction    Hip adduction    Hip internal rotation    Hip external rotation    Knee flexion    Knee extension    Ankle dorsiflexion  Ankle plantarflexion    Ankle inversion    Ankle eversion      (Blank rows = not tested)  LOWER EXTREMITY MMT:    MMT Right eval Left eval  Hip flexion 5 5  Hip extension    Hip abduction 4 pain 4+ pain  Hip adduction    Hip internal rotation    Hip external rotation    Knee flexion 4 pain  4+ pain   Knee extension 5 5  Ankle dorsiflexion 5 5  Ankle plantarflexion    Ankle inversion    Ankle eversion     (Blank rows = not tested)  LUMBAR SPECIAL TESTS:  Straight leg raise test: neg, pain end range Passive SLR , Trendelenburg sign: Negative, and pain increased with supine full body extension reduced when knees were bent.  *Long axis distraction of each leg provided full pain relief  FUNCTIONAL TESTS:  5 times sit to stand: NT   SLS increased effort on Rt LE but can do this 20-30 sec  GAIT: Distance walked: 150 Assistive device utilized: None Level of assistance: Complete Independence Comments: ne deviations   TODAY'S TREATMENT:     [facility] Adult PT Treatment:                                                DATE: 04/11/23 Therapeutic Exercise: Supine A/P tilt PPT with march - 2x10 LTR - 20x Knee to chest each side x 2  Rt knee extension thigh supported Bridge with ball between knees (articulating)  ER/IR in sidelying x  15   Manual Therapy: Sidelying quadratus lumborum (QL) stretch, manual pressure to elongate Rt side Skilled palpation to identify trigger points for TDN STM to all listed muscles following TDN  Trigger Point Dry-Needling  Treatment instructions: Expect mild to moderate muscle soreness. S/S of pneumothorax if dry needled over a lung field, and to seek immediate medical attention should they occur. Patient verbalized understanding of these instructions and education.  Patient Consent Given: Yes Education handout provided: No Muscles treated: R lumbar paraspinals, bil UT Electrical stimulation performed: No Parameters: N/A Treatment response/outcome: twitch, pain reduction    [facility] Adult PT Treatment:                                                 DATE: 04/08/23 Therapeutic Exercise: Supine A/P tilt Knee to chest each side x 2  Rt knee extension thigh supported.  Hamstring and ITB/Adductor with strap Ball under pelvis for lower core stabilization Clam, single leg knee extension 90/90 hold and reverse toe taps  LTR Bridge with ball between knees (articulating)   Hip abduction x 15  ER/IR in sidelying x  15  Manual Therapy: Sidelying quadratus lumborum (QL) stretch, manual pressure to elongate Rt side Trigger point release to Rt QL  PATIENT EDUCATION:  Education details: PT, HEP, core, directional preference, disc health and pilates  Person educated: Patient Education method: Explanation, Demonstration, Verbal cues, and Handouts Education comprehension: verbalized understanding, returned demonstration, and needs further education  HOME EXERCISE PROGRAM: Access Code: [email or web address] URL: [email or web address] Date: 04/02/2023 Prepared by: Karie Mainland  Exercises - Prone Press Up On Elbows  - 2-3 x daily - 7 x weekly - 2 sets - 10 reps - 10 hold - Standing 'L' Stretch at Counter  - 2-3 x daily - 7 x weekly - 1 sets - 3-5 reps - 30 hold - Child's Pose Stretch  - 1 x daily - 7 x weekly - 1 sets - 3-5 reps - 30 hold - Supine Posterior Pelvic Tilt  - 2-3 x daily - 7 x weekly - 2 sets - 10 reps - 5 hold  ASSESSMENT:  CLINICAL IMPRESSION: Haylee tolerated session well with no adverse reaction.  Pt enters clinic with improved low back pain but increase in mid and UT tension and pain.  We trialed TDN today to reduce muscle tension and pain.  Pt tolerated needling of UT well but was hesitant and tense during needling of R sided lumbar paraspinals.  Given pts increased tension we elected to proceed with manual stretching of QL in lieu of needling to good effect.  Pt reports she  may wish to proceed with more targeted lumbar TDN at a future appt.  OBJECTIVE IMPAIRMENTS: decreased mobility, decreased ROM, decreased strength, increased fascial restrictions, increased muscle spasms, impaired flexibility, impaired sensation, postural dysfunction, and pain.   ACTIVITY LIMITATIONS: carrying, lifting, bending, sitting, standing, squatting, and locomotion level  PARTICIPATION LIMITATIONS: meal prep, laundry, community activity, and occupation  PERSONAL FACTORS: Profession and 1 comorbidity: history of upper back pain  are also affecting patient's functional outcome.   REHAB POTENTIAL: Excellent  CLINICAL DECISION MAKING: Evolving/moderate complexity  EVALUATION COMPLEXITY: Moderate   GOALS: Goals reviewed with patient? Yes  SHORT TERM GOALS: Target date: 04/23/2023    Pt will be I with initial HEP  Baseline: Goal status: INITIAL  2.  Pt will report improved ability to stand for ADLs, cook up to 30 min  Baseline:  Goal status: INITIAL  3.  Pt will understand stability and exercises to complete Pilates Reformer exercises without increasing pain  Baseline:  Goal status: INITIAL  4.  Patient will make efforts to stand up each hour for stretch breaks Baseline:  Goal status: INITIAL   LONG TERM GOALS: Target date: 05/28/2023    Patient will be independent with home exercise program upon discharge Baseline:  Goal status: INITIAL  2.  Patient patient will no longer have radiating pain to lower extremities Baseline:  Goal status: INITIAL  3.  Patient will be able to stand to cook and do basic home tasks include vacuuming with no more than minimal pain  in lower back for up to 1 hour Baseline:  Goal status: INITIAL  4.  FOTO score will improve to 69% or better to demo improved functional mobility  Baseline:  Goal status: INITIAL  5.  Pt will resume Pilates group classes without pain exacerbation 2 times per week Baseline:  Goal status:  INITIAL  PLAN:  PT FREQUENCY: 2x/week  PT DURATION: 8 weeks6-8 weeks   PLANNED INTERVENTIONS: Therapeutic exercises, Therapeutic activity, Neuromuscular re-education, Balance training, Patient/Family education, Self Care, Joint mobilization, Dry Needling, Electrical stimulation, Spinal mobilization, Cryotherapy, Moist heat, Traction, Manual therapy, Re-evaluation, and pilates based PT  .  PLAN FOR NEXT SESSION: HEP, manual (traction, LAD) and consider Tr P DN . Ext vs flexion    Fredderick Phenix, PT 04/11/2023, 12:28 PM   Phone: [phone] Fax: [phone]

## 2023-04-17 ENCOUNTER — Ambulatory Visit: Payer: 59

## 2023-04-22 NOTE — BH Specialist Note (Deleted)
Integrated Behavioral Health via Telemedicine Visit  04/22/2023 Kathleen Farley [id number]  Number of Integrated Behavioral Health Clinician visits: 1- Initial Visit  Session Start time: 1525   Session End time: 1616  Total time in minutes: 51   Referring Provider: *** Patient/Family location: [name] Provider location: *** All persons participating in visit: *** Types of Service: {CHL AMB TYPE OF SERVICE:[phone]}  I connected with Kathleen Farley and/or Kathleen Farley's {family members:20773} via  Telephone or Video Enabled Telemedicine Application  (Video is Caregility application) and verified that I am speaking with the correct person using two identifiers. Discussed confidentiality: {YES/NO:21197}  I discussed the limitations of telemedicine and the availability of in person appointments.  Discussed there is a possibility of technology failure and discussed alternative modes of communication if that failure occurs.  I discussed that engaging in this telemedicine visit, they consent to the provision of behavioral healthcare and the services will be billed under their insurance.  Patient and/or legal guardian expressed understanding and consented to Telemedicine visit: {YES/NO:21197}  Presenting Concerns: Patient and/or family reports the following symptoms/concerns: *** Duration of problem: ***; Severity of problem: {Mild/Moderate/Severe:20260}  Patient and/or Family's Strengths/Protective Factors: {CHL AMB BH PROTECTIVE FACTORS:[id number]}  Goals Addressed: Patient will:  Reduce symptoms of: {IBH Symptoms:21014056}   Increase knowledge and/or ability of: {IBH Patient Tools:21014057}   Demonstrate ability to: {IBH Goals:21014053}  Progress towards Goals: {CHL AMB BH PROGRESS TOWARDS GOALS:[phone]}  Interventions: Interventions utilized:  {IBH Interventions:21014054} Standardized Assessments completed: {IBH Screening Tools:21014051}  Patient and/or Family  Response: ***  Assessment: Patient currently experiencing ***.   Patient may benefit from ***.  Plan: Follow up with behavioral health clinician on : *** Behavioral recommendations: *** Referral(s): {IBH Referrals:21014055}  I discussed the assessment and treatment plan with the patient and/or parent/guardian. They were provided an opportunity to ask questions and all were answered. They agreed with the plan and demonstrated an understanding of the instructions.   They were advised to call back or seek an in-person evaluation if the symptoms worsen or if the condition fails to improve as anticipated.  Valetta Close Kathleen Aina, LCSW

## 2023-04-25 ENCOUNTER — Ambulatory Visit: Payer: 59 | Admitting: Physical Therapy

## 2023-04-30 ENCOUNTER — Ambulatory Visit: Payer: 59

## 2023-05-01 NOTE — Therapy (Deleted)
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   Past Surgical History:  Procedure Laterality Date   BREAST LUMPECTOMY Right 1994   BREAST LUMPECTOMY  07/2007   papilloma   SALPINGOOPHORECTOMY Right 2004   endometriosis   Patient Active Problem List   Diagnosis Date Noted   Cervicalgia 09/17/2022   Family history of breast cancer 12/23/2020   Postmenopausal 12/23/2020   Vitamin D deficiency, unspecified 12/02/2018   Mixed hyperlipidemia 12/02/2018   Seasonal and perennial allergic rhinoconjunctivitis 11/03/2018   Asthma, well controlled, moderate persistent 11/03/2018   Asthma, cold induced, moderate persistent, with status asthmaticus 09/03/2018   Anxiety state 07/24/2013   History of migraine headaches 02/20/2012    PCP: [place], Betty G, MD  REFERRING PROVIDER: [place], Betty G, MD  REFERRING DIAG: Acute Rt sided low back pain   Rationale for Evaluation and Treatment: Rehabilitation  THERAPY DIAG:  No diagnosis found.  ONSET DATE: 1 month   SUBJECTIVE:                                                                                                                                                                                           SUBJECTIVE STATEMENT: Pt reports some improvement in her back pain since last session.   She continues to endorse 5/10 shoulder (UT) and 3/10 low back pain.    EVAL: I always have upper back and neck tension but now its coming down to my low back. I used to not be able to even lie on my back when I saw dr. [place]. The pain improved with the medicine, but I still have pain, sometimes severe in rt > lt. Side of low back.  Pt has difficulty standing (cook 1-2 hours) I can walk but walking does not ease the pain. Sitting/lying  down eases pain . Pain radiates to her Rt post thigh but not below the knee.  Numb/tingling in Rt LE, only min. "I think it is muscle pain". Pt also relays increased stress lately, work related. She is used to doing  Pilates daily, yoga every day and walk 2-3 x per week. Since a 2 week trip to [place] she has done very little formal exercise. 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  PERTINENT HISTORY:   Bunions, asthma, PT for upper back pain in 2019, 3 visits.   MD  note: This is a new problem. The current episode started in the past 7 days. The problem occurs constantly. The problem is unchanged. The pain does not radiate. The pain is moderate. The symptoms are aggravated by position and standing. Pertinent negatives include no abdominal pain, bladder incontinence, bowel incontinence, chest pain, dysuria, headaches, leg pain, numbness, pelvic pain, perianal numbness, weakness or weight loss. Risk factors include menopause. She has tried nothing for the symptoms.  She rates the pain as 7/10.  She states the pain has been constant, but worsens when she leans on something or with palpation.  PAIN:  Are you having pain? Yes: NPRS scale: 5/10 Pain location: Right side > left side lumbar to rR post hip.  Pain description: Tight sore Aggravating factors: Standing in 1 place for too long, patient is also uncomfortable sitting with full weightbearing on right hip Relieving factors: Leaning to the left in sitting, sitting in general will reduce pain if she is standing too long, moving around Pain can be 10/10 at times   PRECAUTIONS: None  RED FLAGS: None   WEIGHT BEARING RESTRICTIONS: No  FALLS:  Has patient fallen in last 6 months? 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  DIAGNOSTIC FINDINGS:  None recent   PATIENT SURVEYS:  FOTO 51%  SCREENING FOR RED FLAGS: Bowel or bladder incontinence: No Spinal tumors: No Cauda equina syndrome: No Compression fracture: No Abdominal aneurysm: No  COGNITION: Overall cognitive status: Within functional limits for tasks assessed     SENSATION: WFL she reports very minimal sensory changes in the right lower extremity  MUSCLE LENGTH: Hamstrings: WNL  Thomas test: NT  POSTURE: Increased thoracic kyphosis, forward head posture , posterior pelvic tilts  PALPATION: Pain and tenderness, spasm present with palpation in prone to right upper lumbar paraspinals extending to superior glutes in addition to the left paraspinals.  LUMBAR ROM:   AROM eval  Flexion WNL tight post thighs   Extension Min pain, central lumbar, 50% limited  Right lateral flexion WNL   Left lateral flexion WNL , Pain on Rt side  Right rotation WNL  Left rotation WNL   Combo ext and sidebending incr pain on RIGHT when moving to L    LOWER EXTREMITY ROM:   WNLs   Passive  Right eval Left eval  Hip flexion    Hip extension    Hip abduction    Hip adduction    Hip internal rotation    Hip external rotation    Knee flexion    Knee extension    Ankle dorsiflexion    Ankle plantarflexion    Ankle inversion    Ankle eversion     (Blank rows = not tested)  LOWER EXTREMITY MMT:    MMT Right eval Left eval  Hip flexion 5 5  Hip extension    Hip abduction 4 pain 4+ pain  Hip adduction    Hip internal rotation    Hip external rotation    Knee flexion 4 pain  4+ pain   Knee extension 5 5  Ankle dorsiflexion 5 5  Ankle plantarflexion    Ankle  inversion    Ankle eversion     (Blank rows = not tested)  LUMBAR  SPECIAL TESTS:  Straight leg raise test: neg, pain end range Passive SLR , Trendelenburg sign: Negative, and pain increased with supine full body extension reduced when knees were bent.  *Long axis distraction of each leg provided full pain relief  FUNCTIONAL TESTS:  5 times sit to stand: NT   SLS increased effort on Rt LE but can do this 20-30 sec  GAIT: Distance walked: 150 Assistive device utilized: None Level of assistance: Complete Independence Comments: ne deviations   TODAY'S TREATMENT:     [facility] Adult PT Treatment:                                                DATE: 04/11/23 Therapeutic Exercise: Supine A/P tilt PPT with march - 2x10 LTR - 20x Knee to chest each side x 2  Rt knee extension thigh supported Bridge with ball between knees (articulating)  ER/IR in sidelying x  15   Manual Therapy: Sidelying quadratus lumborum (QL) stretch, manual pressure to elongate Rt side Skilled palpation to identify trigger points for TDN STM to all listed muscles following TDN  Trigger Point Dry-Needling  Treatment instructions: Expect mild to moderate muscle soreness. S/S of pneumothorax if dry needled over a lung field, and to seek immediate medical attention should they occur. Patient verbalized understanding of these instructions and education.  Patient Consent Given: Yes Education handout provided: No Muscles treated: R lumbar paraspinals, bil UT Electrical stimulation performed: No Parameters: N/A Treatment response/outcome: twitch, pain reduction    [facility] Adult PT Treatment:                                                DATE: 04/08/23 Therapeutic Exercise: Supine A/P tilt Knee to chest each side x 2  Rt knee extension thigh supported.  Hamstring and ITB/Adductor with strap Ball under pelvis for lower core stabilization Clam, single leg knee extension 90/90 hold and reverse toe taps  LTR Bridge with ball between knees (articulating)   Hip abduction x 15  ER/IR in  sidelying x  15  Manual Therapy: Sidelying quadratus lumborum (QL) stretch, manual pressure to elongate Rt side Trigger point release to Rt QL                                                                                                                    PATIENT EDUCATION:  Education details: PT, HEP, core, directional preference, disc health and pilates  Person educated: Patient Education method: Explanation, Demonstration, Verbal cues, and Handouts Education comprehension: verbalized understanding, returned demonstration, and needs further education  HOME EXERCISE PROGRAM: Access Code: [email or web address] URL: [email or web address] Date: 04/02/2023 Prepared  by: Karie Mainland  Exercises - Prone Press Up On Elbows  - 2-3 x daily - 7 x weekly - 2 sets - 10 reps - 10 hold - Standing 'L' Stretch at Counter  - 2-3 x daily - 7 x weekly - 1 sets - 3-5 reps - 30 hold - Child's Pose Stretch  - 1 x daily - 7 x weekly - 1 sets - 3-5 reps - 30 hold - Supine Posterior Pelvic Tilt  - 2-3 x daily - 7 x weekly - 2 sets - 10 reps - 5 hold  ASSESSMENT:  CLINICAL IMPRESSION: Faelyn tolerated session well with no adverse reaction.  Pt enters clinic with improved low back pain but increase in mid and UT tension and pain.  We trialed TDN today to reduce muscle tension and pain.  Pt tolerated needling of UT well but was hesitant and tense during needling of R sided lumbar paraspinals.  Given pts increased tension we elected to proceed with manual stretching of QL in lieu of needling to good effect.  Pt reports she may wish to proceed with more targeted lumbar TDN at a future appt.  OBJECTIVE IMPAIRMENTS: decreased mobility, decreased ROM, decreased strength, increased fascial restrictions, increased muscle spasms, impaired flexibility, impaired sensation, postural dysfunction, and pain.   ACTIVITY LIMITATIONS: carrying, lifting, bending, sitting, standing, squatting, and locomotion  level  PARTICIPATION LIMITATIONS: meal prep, laundry, community activity, and occupation  PERSONAL FACTORS: Profession and 1 comorbidity: history of upper back pain  are also affecting patient's functional outcome.   REHAB POTENTIAL: Excellent  CLINICAL DECISION MAKING: Evolving/moderate complexity  EVALUATION COMPLEXITY: Moderate   GOALS: Goals reviewed with patient? Yes  SHORT TERM GOALS: Target date: 04/23/2023    Pt will be I with initial HEP  Baseline: Goal status: INITIAL  2.  Pt will report improved ability to stand for ADLs, cook up to 30 min  Baseline:  Goal status: INITIAL  3.  Pt will understand stability and exercises to complete Pilates Reformer exercises without increasing pain  Baseline:  Goal status: INITIAL  4.  Patient will make efforts to stand up each hour for stretch breaks Baseline:  Goal status: INITIAL   LONG TERM GOALS: Target date: 05/28/2023    Patient will be independent with home exercise program upon discharge Baseline:  Goal status: INITIAL  2.  Patient patient will no longer have radiating pain to lower extremities Baseline:  Goal status: INITIAL  3.  Patient will be able to stand to cook and do basic home tasks include vacuuming with no more than minimal pain  in lower back for up to 1 hour Baseline:  Goal status: INITIAL  4.  FOTO score will improve to 69% or better to demo improved functional mobility  Baseline:  Goal status: INITIAL  5.  Pt will resume Pilates group classes without pain exacerbation 2 times per week Baseline:  Goal status: INITIAL  PLAN:  PT FREQUENCY: 2x/week  PT DURATION: 8 weeks6-8 weeks   PLANNED INTERVENTIONS: Therapeutic exercises, Therapeutic activity, Neuromuscular re-education, Balance training, Patient/Family education, Self Care, Joint mobilization, Dry Needling, Electrical stimulation, Spinal mobilization, Cryotherapy, Moist heat, Traction, Manual therapy, Re-evaluation, and pilates based  PT  .   PLAN FOR NEXT SESSION: HEP, manual (traction, LAD) and consider Tr P DN . Ext vs flexion    Lynnet Hefley, PT 05/01/2023, 12:10 PM   Phone: [phone] Fax: [phone]

## 2023-05-02 ENCOUNTER — Ambulatory Visit: Payer: 59 | Attending: Family Medicine | Admitting: Physical Therapy

## 2023-05-02 DIAGNOSIS — R252 Cramp and spasm: Secondary | ICD-10-CM | POA: Insufficient documentation

## 2023-05-02 DIAGNOSIS — R293 Abnormal posture: Secondary | ICD-10-CM | POA: Insufficient documentation

## 2023-05-02 DIAGNOSIS — M5459 Other low back pain: Secondary | ICD-10-CM | POA: Insufficient documentation

## 2023-05-05 NOTE — Therapy (Deleted)
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   Past Surgical History:  Procedure Laterality Date   BREAST LUMPECTOMY Right 1994   BREAST LUMPECTOMY  07/2007   papilloma   SALPINGOOPHORECTOMY Right 2004   endometriosis   Patient Active Problem List   Diagnosis Date Noted   Cervicalgia 09/17/2022   Family history of breast cancer 12/23/2020   Postmenopausal 12/23/2020   Vitamin D deficiency, unspecified 12/02/2018   Mixed hyperlipidemia 12/02/2018   Seasonal and perennial allergic rhinoconjunctivitis 11/03/2018   Asthma, well controlled, moderate persistent 11/03/2018   Asthma, cold induced, moderate persistent, with status asthmaticus 09/03/2018   Anxiety state 07/24/2013   History of migraine headaches 02/20/2012    PCP: [place], Betty G, MD  REFERRING PROVIDER: [place], Betty G, MD  REFERRING DIAG: Acute Rt sided low back pain   Rationale for Evaluation and Treatment: Rehabilitation  THERAPY DIAG:  No diagnosis found.  ONSET DATE: 1 month   SUBJECTIVE:                                                                                                                                                                                           SUBJECTIVE STATEMENT: Pt reports some improvement in her back pain since last session.   She continues to endorse 5/10 shoulder (UT) and 3/10 low back pain.    EVAL: I always have upper back and neck tension but now its coming down to my low back. I used to not be able to even lie on my back when I saw dr. [place]. The pain improved with the medicine, but I still have pain, sometimes severe in rt > lt. Side of low back.  Pt has difficulty standing (cook 1-2 hours) I can walk but walking does not ease the pain. Sitting/lying  down eases pain . Pain radiates to her Rt post thigh but not below the knee.  Numb/tingling in Rt LE, only min. "I think it is muscle pain". Pt also relays increased stress lately, work related. She is used to doing  Pilates daily, yoga every day and walk 2-3 x per week. Since a 2 week trip to [place] she has done very little formal exercise. 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  PERTINENT HISTORY:   Bunions, asthma, PT for upper back pain in 2019, 3 visits.   MD  note: This is a new problem. The current episode started in the past 7 days. The problem occurs constantly. The problem is unchanged. The pain does not radiate. The pain is moderate. The symptoms are aggravated by position and standing. Pertinent negatives include no abdominal pain, bladder incontinence, bowel incontinence, chest pain, dysuria, headaches, leg pain, numbness, pelvic pain, perianal numbness, weakness or weight loss. Risk factors include menopause. She has tried nothing for the symptoms.  She rates the pain as 7/10.  She states the pain has been constant, but worsens when she leans on something or with palpation.  PAIN:  Are you having pain? Yes: NPRS scale: 5/10 Pain location: Right side > left side lumbar to rR post hip.  Pain description: Tight sore Aggravating factors: Standing in 1 place for too long, patient is also uncomfortable sitting with full weightbearing on right hip Relieving factors: Leaning to the left in sitting, sitting in general will reduce pain if she is standing too long, moving around Pain can be 10/10 at times   PRECAUTIONS: None  RED FLAGS: None   WEIGHT BEARING RESTRICTIONS: No  FALLS:  Has patient fallen in last 6 months? 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  DIAGNOSTIC FINDINGS:  None recent   PATIENT SURVEYS:  FOTO 51%  SCREENING FOR RED FLAGS: Bowel or bladder incontinence: No Spinal tumors: No Cauda equina syndrome: No Compression fracture: No Abdominal aneurysm: No  COGNITION: Overall cognitive status: Within functional limits for tasks assessed     SENSATION: WFL she reports very minimal sensory changes in the right lower extremity  MUSCLE LENGTH: Hamstrings: WNL  Thomas test: NT  POSTURE: Increased thoracic kyphosis, forward head posture , posterior pelvic tilts  PALPATION: Pain and tenderness, spasm present with palpation in prone to right upper lumbar paraspinals extending to superior glutes in addition to the left paraspinals.  LUMBAR ROM:   AROM eval  Flexion WNL tight post thighs   Extension Min pain, central lumbar, 50% limited  Right lateral flexion WNL   Left lateral flexion WNL , Pain on Rt side  Right rotation WNL  Left rotation WNL   Combo ext and sidebending incr pain on RIGHT when moving to L    LOWER EXTREMITY ROM:   WNLs   Passive  Right eval Left eval  Hip flexion    Hip extension    Hip abduction    Hip adduction    Hip internal rotation    Hip external rotation    Knee flexion    Knee extension    Ankle dorsiflexion    Ankle plantarflexion    Ankle inversion    Ankle eversion     (Blank rows = not tested)  LOWER EXTREMITY MMT:    MMT Right eval Left eval  Hip flexion 5 5  Hip extension    Hip abduction 4 pain 4+ pain  Hip adduction    Hip internal rotation    Hip external rotation    Knee flexion 4 pain  4+ pain   Knee extension 5 5  Ankle dorsiflexion 5 5  Ankle plantarflexion    Ankle  inversion    Ankle eversion     (Blank rows = not tested)  LUMBAR  SPECIAL TESTS:  Straight leg raise test: neg, pain end range Passive SLR , Trendelenburg sign: Negative, and pain increased with supine full body extension reduced when knees were bent.  *Long axis distraction of each leg provided full pain relief  FUNCTIONAL TESTS:  5 times sit to stand: NT   SLS increased effort on Rt LE but can do this 20-30 sec  GAIT: Distance walked: 150 Assistive device utilized: None Level of assistance: Complete Independence Comments: ne deviations   TODAY'S TREATMENT:      [facility] Adult PT Treatment:                                                DATE: 05/06/23 Therapeutic Exercise: *** Manual Therapy: *** Neuromuscular re-ed: *** Therapeutic Activity: *** Modalities: *** Self Care: ***  [facility] Adult PT Treatment:                                                DATE: 04/11/23 Therapeutic Exercise: Supine A/P tilt PPT with march - 2x10 LTR - 20x Knee to chest each side x 2  Rt knee extension thigh supported Bridge with ball between knees (articulating)  ER/IR in sidelying x  15   Manual Therapy: Sidelying quadratus lumborum (QL) stretch, manual pressure to elongate Rt side Skilled palpation to identify trigger points for TDN STM to all listed muscles following TDN  Trigger Point Dry-Needling  Treatment instructions: Expect mild to moderate muscle soreness. S/S of pneumothorax if dry needled over a lung field, and to seek immediate medical attention should they occur. Patient verbalized understanding of these instructions and education.  Patient Consent Given: Yes Education handout provided: No Muscles treated: R lumbar paraspinals, bil UT Electrical stimulation performed: No Parameters: N/A Treatment response/outcome: twitch, pain reduction    [facility] Adult PT Treatment:                                                DATE: 04/08/23 Therapeutic Exercise: Supine A/P tilt Knee to chest each side x 2  Rt knee extension thigh supported.   Hamstring and ITB/Adductor with strap Ball under pelvis for lower core stabilization Clam, single leg knee extension 90/90 hold and reverse toe taps  LTR Bridge with ball between knees (articulating)   Hip abduction x 15  ER/IR in sidelying x  15  Manual Therapy: Sidelying quadratus lumborum (QL) stretch, manual pressure to elongate Rt side Trigger point release to Rt QL  PATIENT EDUCATION:  Education details: PT, HEP, core, directional preference, disc health and pilates  Person educated: Patient Education method: Explanation, Demonstration, Verbal cues, and Handouts Education comprehension: verbalized understanding, returned demonstration, and needs further education  HOME EXERCISE PROGRAM: Access Code: [email or web address] URL: [email or web address] Date: 04/02/2023 Prepared by: Karie Mainland  Exercises - Prone Press Up On Elbows  - 2-3 x daily - 7 x weekly - 2 sets - 10 reps - 10 hold - Standing 'L' Stretch at Counter  - 2-3 x daily - 7 x weekly - 1 sets - 3-5 reps - 30 hold - Child's Pose Stretch  - 1 x daily - 7 x weekly - 1 sets - 3-5 reps - 30 hold - Supine Posterior Pelvic Tilt  - 2-3 x daily - 7 x weekly - 2 sets - 10 reps - 5 hold  ASSESSMENT:  CLINICAL IMPRESSION: Jerica tolerated session well with no adverse reaction.  Pt enters clinic with improved low back pain but increase in mid and UT tension and pain.  We trialed TDN today to reduce muscle tension and pain.  Pt tolerated needling of UT well but was hesitant and tense during needling of R sided lumbar paraspinals.  Given pts increased tension we elected to proceed with manual stretching of QL in lieu of needling to good effect.  Pt reports she may wish to proceed with more targeted lumbar TDN at a future appt.  OBJECTIVE IMPAIRMENTS: decreased mobility, decreased ROM, decreased strength,  increased fascial restrictions, increased muscle spasms, impaired flexibility, impaired sensation, postural dysfunction, and pain.   ACTIVITY LIMITATIONS: carrying, lifting, bending, sitting, standing, squatting, and locomotion level  PARTICIPATION LIMITATIONS: meal prep, laundry, community activity, and occupation  PERSONAL FACTORS: Profession and 1 comorbidity: history of upper back pain  are also affecting patient's functional outcome.   REHAB POTENTIAL: Excellent  CLINICAL DECISION MAKING: Evolving/moderate complexity  EVALUATION COMPLEXITY: Moderate   GOALS: Goals reviewed with patient? Yes  SHORT TERM GOALS: Target date: 04/23/2023    Pt will be I with initial HEP  Baseline: Goal status: INITIAL  2.  Pt will report improved ability to stand for ADLs, cook up to 30 min  Baseline:  Goal status: INITIAL  3.  Pt will understand stability and exercises to complete Pilates Reformer exercises without increasing pain  Baseline:  Goal status: INITIAL  4.  Patient will make efforts to stand up each hour for stretch breaks Baseline:  Goal status: INITIAL   LONG TERM GOALS: Target date: 05/28/2023    Patient will be independent with home exercise program upon discharge Baseline:  Goal status: INITIAL  2.  Patient patient will no longer have radiating pain to lower extremities Baseline:  Goal status: INITIAL  3.  Patient will be able to stand to cook and do basic home tasks include vacuuming with no more than minimal pain  in lower back for up to 1 hour Baseline:  Goal status: INITIAL  4.  FOTO score will improve to 69% or better to demo improved functional mobility  Baseline:  Goal status: INITIAL  5.  Pt will resume Pilates group classes without pain exacerbation 2 times per week Baseline:  Goal status: INITIAL  PLAN:  PT FREQUENCY: 2x/week  PT DURATION: 8 weeks6-8 weeks   PLANNED INTERVENTIONS: Therapeutic exercises, Therapeutic activity, Neuromuscular  re-education, Balance training, Patient/Family education, Self Care, Joint mobilization, Dry Needling, Electrical stimulation, Spinal mobilization, Cryotherapy, Moist heat, Traction, Manual therapy, Re-evaluation, and pilates based PT  .  PLAN FOR NEXT SESSION: HEP, manual (traction, LAD) and consider Tr P DN . Ext vs flexion    Ilamae Geng, PT 05/05/2023, 1:37 PM   Phone: [phone] Fax: [phone]

## 2023-05-06 ENCOUNTER — Ambulatory Visit: Payer: 59 | Admitting: Physical Therapy

## 2023-05-06 ENCOUNTER — Telehealth: Payer: Self-pay | Admitting: Physical Therapy

## 2023-05-06 NOTE — Telephone Encounter (Signed)
Called patient regarding her missed appointment today and also last week.  She is currently out of town and thought that she had canceled her appointment today.  She does plan to be here for her next appointment on Wednesday at 345.  Notified of the attendance policy and that because there is been 2 no-shows she will need to make her appointments 1 at a time.  Patient is agreeable.   Karie Mainland, PT 05/06/23 3:20 PM Phone: [phone] Fax: [phone]

## 2023-05-08 ENCOUNTER — Ambulatory Visit: Payer: 59 | Admitting: Physical Therapy

## 2023-05-08 ENCOUNTER — Encounter: Payer: Self-pay | Admitting: Physical Therapy

## 2023-05-08 DIAGNOSIS — R293 Abnormal posture: Secondary | ICD-10-CM | POA: Diagnosis present

## 2023-05-08 DIAGNOSIS — M5459 Other low back pain: Secondary | ICD-10-CM

## 2023-05-08 DIAGNOSIS — R252 Cramp and spasm: Secondary | ICD-10-CM

## 2023-05-08 NOTE — Therapy (Signed)
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   Past Surgical History:  Procedure Laterality Date   BREAST LUMPECTOMY Right 1994   BREAST LUMPECTOMY  07/2007   papilloma   SALPINGOOPHORECTOMY Right 2004   endometriosis   Patient Active Problem List   Diagnosis Date Noted   Cervicalgia 09/17/2022   Family history of breast cancer 12/23/2020   Postmenopausal 12/23/2020   Vitamin D deficiency, unspecified 12/02/2018   Mixed hyperlipidemia 12/02/2018   Seasonal and perennial allergic rhinoconjunctivitis 11/03/2018   Asthma, well controlled, moderate persistent 11/03/2018   Asthma, cold induced, moderate persistent, with status asthmaticus 09/03/2018   Anxiety state 07/24/2013   History of migraine headaches 02/20/2012    PCP: [place], Betty G, MD  REFERRING PROVIDER: [place], Betty G, MD  REFERRING DIAG: Acute Rt sided low back pain   Rationale for Evaluation and Treatment: Rehabilitation  THERAPY DIAG:  Other low back pain  Abnormal posture  Cramp and spasm  ONSET DATE: 1 month   SUBJECTIVE:                                                                                                                                                                                           SUBJECTIVE STATEMENT: Was travelling.  Patient missed a couple of appointments.  Due to her travel schedule she will be coming once  every other week now.  Left-sided low back pain is 7/10.     EVAL: I always have upper back and neck tension but now its coming down to my low back. I used to not be able to even lie on my back when I saw dr. [place]. The pain improved with the medicine, but I still have pain, sometimes severe in rt > lt. Side of low back.  Pt has difficulty standing (cook 1-2 hours) I can walk but walking does not ease the pain. Sitting/lying  down eases pain . Pain radiates to her Rt post thigh but not below the knee.  Numb/tingling in Rt LE, only min. "I think it is muscle pain". Pt also relays increased stress lately, work related. She is used to doing  Pilates daily, yoga every day and walk 2-3 x per week. Since a 2 week trip to [place] she has done very little formal exercise. 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  PERTINENT HISTORY:   [place], asthma, PT for upper back pain in 2019, 3 visits.   MD note: This is a new problem. The current episode started in the past 7 days. The problem occurs constantly. The problem is unchanged. The pain does not radiate. The pain is moderate. The symptoms are aggravated by position and standing. Pertinent negatives include no abdominal pain, bladder incontinence, bowel incontinence, chest pain, dysuria, headaches, leg pain, numbness, pelvic pain, perianal numbness, weakness or weight loss. Risk factors include menopause. She has tried nothing for the symptoms.  She rates the pain as 7/10.  She states the pain has been constant, but worsens when she leans on something or with palpation.  PAIN:  Are you having pain? Yes: NPRS scale: 7/10 Pain location: Right side > left side lumbar to rR post hip.  Pain description: Tight sore Aggravating factors: Standing in 1 place for too long, patient is also uncomfortable sitting with full weightbearing on right hip Relieving factors: Leaning to the left in sitting, sitting in general  will reduce pain if she is standing too long, moving around Pain can be 10/10 at times   PRECAUTIONS: None  RED FLAGS: None   WEIGHT BEARING RESTRICTIONS: No  FALLS:  Has patient fallen in last 6 months? 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  DIAGNOSTIC FINDINGS:  None recent   PATIENT SURVEYS:  FOTO 51%  SCREENING FOR RED FLAGS: Bowel or bladder incontinence: No Spinal tumors: No Cauda equina syndrome: No Compression fracture: No Abdominal aneurysm: No  COGNITION: Overall cognitive status: Within functional limits for tasks assessed     SENSATION: WFL she reports very minimal sensory changes in the right lower extremity  MUSCLE LENGTH: Hamstrings: WNL  Thomas test: NT  POSTURE: Increased thoracic kyphosis, forward head posture , posterior pelvic tilts  PALPATION: Pain and tenderness, spasm present with palpation in prone to right upper lumbar paraspinals extending to superior glutes in addition to the left paraspinals.  LUMBAR ROM:   AROM eval  Flexion WNL tight post thighs   Extension Min pain, central lumbar, 50% limited  Right lateral flexion WNL   Left lateral flexion WNL , Pain on Rt side  Right rotation WNL  Left rotation WNL   Combo ext and sidebending incr pain on RIGHT when moving to L    LOWER EXTREMITY ROM:   WNLs   Passive  Right eval Left eval  Hip flexion    Hip extension    Hip abduction    Hip adduction    Hip internal rotation    Hip external rotation    Knee flexion  Knee extension    Ankle dorsiflexion    Ankle plantarflexion    Ankle  inversion    Ankle eversion     (Blank rows = not tested)  LOWER EXTREMITY MMT:    MMT Right eval Left eval  Hip flexion 5 5  Hip extension    Hip abduction 4 pain 4+ pain  Hip adduction    Hip internal rotation    Hip external rotation    Knee flexion 4 pain  4+ pain   Knee extension 5 5  Ankle dorsiflexion 5 5  Ankle plantarflexion    Ankle inversion    Ankle eversion     (Blank rows = not tested)  LUMBAR SPECIAL TESTS:  Straight leg raise test: neg, pain end range Passive SLR , Trendelenburg sign: Negative, and pain increased with supine full body extension reduced when knees were bent.  *Long axis distraction of each leg provided full pain relief  FUNCTIONAL TESTS:  5 times sit to stand: NT   SLS increased effort on Rt LE but can do this 20-30 sec  GAIT: Distance walked: 150 Assistive device utilized: None Level of assistance: Complete Independence Comments: ne deviations   TODAY'S TREATMENT:      [facility] Adult PT Treatment:                                                DATE: 05/06/23 Therapeutic Exercise: Supine Posterior Pelvic Tilt  - 2-3 x daily - 7 x weekly - 2 sets - 10 reps - 5 hold Bridge x 10  Knees crossed with LTR  Supine 90/90 Abdominal Bracing   Supine 90/90 Alternating Heel Touches with Posterior Pelvic Tilt   Supine 90/90 with Leg Extensions  - 1 x daily - 7 x weekly - 2 sets - 10 reps - 5 hold Supine Dead Bug with Leg Extension  x 10  Standing springboard pull row and extension  Wall slides /squats with ball T spine x 10  Added 10 lbs x 10  Childs pose x 1 min  Added lateral bias Bird dog x 5, min cues  Manual   PROM each hip in prone with compression to lower lumbar and posterior hip bilaterally  [facility] Adult PT Treatment:                                                DATE: 04/11/23 Therapeutic Exercise: Supine A/P tilt PPT with march - 2x10 LTR - 20x Knee to chest each side x 2  Rt knee extension thigh supported Bridge with ball  between knees (articulating)  ER/IR in sidelying x  15   Manual Therapy: Sidelying quadratus lumborum (QL) stretch, manual pressure to elongate Rt side Skilled palpation to identify trigger points for TDN STM to all listed muscles following TDN  Trigger Point Dry-Needling  Treatment instructions: Expect mild to moderate muscle soreness. S/S of pneumothorax if dry needled over a lung field, and to seek immediate medical attention should they occur. Patient verbalized understanding of these instructions and education.  Patient Consent Given: Yes Education handout provided: No Muscles treated: R lumbar paraspinals, bil UT Electrical stimulation performed: No Parameters: N/A Treatment response/outcome: twitch, pain reduction    [facility] Adult  PT Treatment:                                                DATE: 04/08/23 Therapeutic Exercise: Supine A/P tilt Knee to chest each side x 2  Rt knee extension thigh supported.  Hamstring and ITB/Adductor with strap Ball under pelvis for lower core stabilization Clam, single leg knee extension 90/90 hold and reverse toe taps  LTR Bridge with ball between knees (articulating)   Hip abduction x 15  ER/IR in sidelying x  15  Manual Therapy: Sidelying quadratus lumborum (QL) stretch, manual pressure to elongate Rt side Trigger point release to Rt QL                                                                                                                    PATIENT EDUCATION:  Education details: PT, HEP, core, directional preference, disc health and pilates  Person educated: Patient Education method: Explanation, Demonstration, Verbal cues, and Handouts Education comprehension: verbalized understanding, returned demonstration, and needs further education  HOME EXERCISE PROGRAM: Access Code: [email or web address] URL: [email or web address] Date: 05/08/2023 Prepared by: Karie Mainland  Exercises - Prone Press Up On Elbows  - 2-3 x  daily - 7 x weekly - 2 sets - 10 reps - 10 hold - Standing 'L' Stretch at Counter  - 2-3 x daily - 7 x weekly - 1 sets - 3-5 reps - 30 hold - Child's Pose Stretch  - 1 x daily - 7 x weekly - 1 sets - 3-5 reps - 30 hold - Supine Posterior Pelvic Tilt  - 2-3 x daily - 7 x weekly - 2 sets - 10 reps - 5 hold - Supine 90/90 Abdominal Bracing  - 1 x daily - 7 x weekly - 2 sets - 10 reps - 5 hold - Supine 90/90 Alternating Heel Touches with Posterior Pelvic Tilt  - 1 x daily - 7 x weekly - 2 sets - 10 reps - 5 hold - Supine 90/90 with Leg Extensions  - 1 x daily - 7 x weekly - 2 sets - 10 reps - 5 hold - Supine Dead Bug with Leg Extension  - 1 x daily - 7 x weekly - 2 sets - 10 reps - 5 hold  ASSESSMENT:  CLINICAL IMPRESSION: Patient continues to have pain in Rt low back, intermittent.  During exercises she needed reminders and cues to find a neutral spine.  She tends to flex in the lumbar spine. She admits to being very focused at work, forgetting to get up and stretch and limited by work commitments.  And when she is travelling it is a challenge to do her HEP.  She will again be travelling and will need to be seen less frequently.  She reported her upcoming appointment around traveling schedule.  OBJECTIVE IMPAIRMENTS: decreased mobility, decreased ROM, decreased strength, increased fascial  restrictions, increased muscle spasms, impaired flexibility, impaired sensation, postural dysfunction, and pain.   ACTIVITY LIMITATIONS: carrying, lifting, bending, sitting, standing, squatting, and locomotion level  PARTICIPATION LIMITATIONS: meal prep, laundry, community activity, and occupation  PERSONAL FACTORS: Profession and 1 comorbidity: history of upper back pain  are also affecting patient's functional outcome.   REHAB POTENTIAL: Excellent  CLINICAL DECISION MAKING: Evolving/moderate complexity  EVALUATION COMPLEXITY: Moderate   GOALS: Goals reviewed with patient? Yes  SHORT TERM GOALS: Target  date: 04/23/2023    Pt will be I with initial HEP  Baseline: Goal status: ongoing   2.  Pt will report improved ability to stand for ADLs, cook up to 30 min  Baseline:  Goal status: ongoing   3.  Pt will understand stability and exercises to complete Pilates Reformer exercises without increasing pain  Baseline:  Goal status: ongoing   4.  Patient will make efforts to stand up each hour for stretch breaks Baseline:  Goal status: ongoing    LONG TERM GOALS: Target date: 05/28/2023    Patient will be independent with home exercise program upon discharge Baseline:  Goal status: INITIAL  2.  Patient patient will no longer have radiating pain to lower extremities Baseline:  Goal status: INITIAL  3.  Patient will be able to stand to cook and do basic home tasks include vacuuming with no more than minimal pain  in lower back for up to 1 hour Baseline:  Goal status: INITIAL  4.  FOTO score will improve to 69% or better to demo improved functional mobility  Baseline:  Goal status: INITIAL  5.  Pt will resume Pilates group classes without pain exacerbation 2 times per week Baseline:  Goal status: INITIAL  PLAN:  PT FREQUENCY: 2x/week  PT DURATION: 8 weeks6-8 weeks   PLANNED INTERVENTIONS: Therapeutic exercises, Therapeutic activity, Neuromuscular re-education, Balance training, Patient/Family education, Self Care, Joint mobilization, Dry Needling, Electrical stimulation, Spinal mobilization, Cryotherapy, Moist heat, Traction, Manual therapy, Re-evaluation, and pilates based PT  .   PLAN FOR NEXT SESSION: HEP, manual (traction, LAD) and consider Tr P DN . Ext vs flexion    Styles Fambro, PT 05/08/2023, 5:58 PM   Phone: [phone] Fax: [phone]

## 2023-05-13 ENCOUNTER — Encounter: Payer: 59 | Admitting: Physical Therapy

## 2023-05-16 ENCOUNTER — Encounter: Payer: Self-pay | Admitting: Physical Therapy

## 2023-05-16 ENCOUNTER — Ambulatory Visit: Payer: 59 | Admitting: Physical Therapy

## 2023-05-16 DIAGNOSIS — M5459 Other low back pain: Secondary | ICD-10-CM | POA: Diagnosis not present

## 2023-05-16 DIAGNOSIS — R293 Abnormal posture: Secondary | ICD-10-CM

## 2023-05-16 DIAGNOSIS — R252 Cramp and spasm: Secondary | ICD-10-CM

## 2023-05-16 NOTE — Therapy (Signed)
OUTPATIENT PHYSICAL THERAPY NOTE   Patient Name: Kathleen Farley MRN: [id number] DOB:Mar 30, 1966, 57 y.o., female Today's Date: 05/16/2023  END OF SESSION:  PT End of Session - 05/16/23 0912     Visit Number 5    Number of Visits 12    Date for PT Re-Evaluation 05/28/23    Authorization Type [employer]    PT Start Time 0915    PT Stop Time 0957    PT Time Calculation (min) 42 min    Activity Tolerance Patient tolerated treatment well    Behavior During Therapy [facility] for tasks assessed/performed                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   Past Surgical History:  Procedure Laterality Date   BREAST LUMPECTOMY Right 1994   BREAST LUMPECTOMY  07/2007   papilloma   SALPINGOOPHORECTOMY Right 2004   endometriosis   Patient Active Problem List   Diagnosis Date Noted   Cervicalgia 09/17/2022   Family history of breast cancer 12/23/2020   Postmenopausal 12/23/2020   Vitamin D deficiency, unspecified 12/02/2018   Mixed hyperlipidemia 12/02/2018   Seasonal and perennial allergic rhinoconjunctivitis 11/03/2018   Asthma, well controlled, moderate persistent 11/03/2018   Asthma, cold induced, moderate persistent, with status asthmaticus 09/03/2018   Anxiety state 07/24/2013   History of migraine headaches 02/20/2012    PCP: [place], Betty G, MD  REFERRING PROVIDER: [place], Betty G, MD  REFERRING DIAG: Acute Rt sided low back pain   Rationale for Evaluation and Treatment: Rehabilitation  THERAPY DIAG:  Other low back pain  Abnormal posture  Cramp and spasm  ONSET DATE: 1 month   SUBJECTIVE:                                                                                                                                                                                           SUBJECTIVE STATEMENT: Pt reports that she is now feeling much better.  She has been doing the exercises more often and is sleeping  in her own bed.   EVAL: I always have upper back and neck tension but now its coming down to my low back. I used to not be able to even lie on my back when I saw dr. [place]. The pain improved with the medicine, but I still have pain, sometimes severe in rt > lt. Side of low back.  Pt has difficulty standing (cook 1-2 hours) I can walk but walking does not ease the pain. Sitting/lying down eases pain . Pain radiates to  her Rt post thigh but not below the knee.  Numb/tingling in Rt LE, only min. "I think it is muscle pain". Pt also relays increased stress lately, work related. She is used to doing  Pilates daily, yoga every day and walk 2-3 x per week. Since a 2 week trip to [place] she has done very little formal exercise. 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  PERTINENT HISTORY:   Bunions, asthma, PT for upper back pain in 2019, 3 visits.   MD note: This is a new problem. The current episode started in the past 7 days. The problem occurs constantly. The problem is unchanged. The pain does not radiate. The pain is moderate. The symptoms are aggravated by position and standing. Pertinent negatives include no abdominal pain, bladder incontinence, bowel incontinence, chest pain, dysuria, headaches, leg pain, numbness, pelvic pain, perianal numbness, weakness or weight loss. Risk factors include menopause. She has tried nothing for the symptoms.  She rates the pain as 7/10.  She states the pain has been constant, but worsens when she leans on something or with palpation.  PAIN:  Are you having pain? Yes: NPRS scale: 7/10 Pain location: Right side > left side lumbar to rR post hip.  Pain description: Tight sore Aggravating factors: Standing in 1 place for too long, patient is also uncomfortable sitting with full weightbearing on right hip Relieving factors: Leaning to the left in sitting, sitting in general will reduce pain if she is standing too long,  moving around Pain can be 10/10 at times   PRECAUTIONS: None  RED FLAGS: None   WEIGHT BEARING RESTRICTIONS: No  FALLS:  Has patient fallen in last 6 months? 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  DIAGNOSTIC FINDINGS:  None recent   PATIENT SURVEYS:  FOTO 51%  SCREENING FOR RED FLAGS: Bowel or bladder incontinence: No Spinal tumors: No Cauda equina syndrome: No Compression fracture: No Abdominal aneurysm: No  COGNITION: Overall cognitive status: Within functional limits for tasks assessed     SENSATION: WFL she reports very minimal sensory changes in the right lower extremity  MUSCLE LENGTH: Hamstrings: WNL  Thomas test: NT  POSTURE: Increased thoracic kyphosis, forward head posture , posterior pelvic tilts  PALPATION: Pain and tenderness, spasm present with palpation in prone to right upper lumbar paraspinals extending to superior glutes in addition to the left paraspinals.  LUMBAR ROM:   AROM eval  Flexion WNL tight post thighs   Extension Min pain, central lumbar, 50% limited  Right lateral flexion WNL   Left lateral flexion WNL , Pain on Rt side  Right rotation WNL  Left rotation WNL   Combo ext and sidebending incr pain on RIGHT when moving to L    LOWER EXTREMITY ROM:   WNLs   Passive  Right eval Left eval  Hip flexion    Hip extension    Hip abduction    Hip adduction    Hip internal rotation    Hip external rotation    Knee flexion    Knee extension    Ankle dorsiflexion  Ankle plantarflexion    Ankle inversion    Ankle eversion     (Blank rows =  not tested)  LOWER EXTREMITY MMT:    MMT Right eval Left eval  Hip flexion 5 5  Hip extension    Hip abduction 4 pain 4+ pain  Hip adduction    Hip internal rotation    Hip external rotation    Knee flexion 4 pain  4+ pain   Knee extension 5 5  Ankle dorsiflexion 5 5  Ankle plantarflexion    Ankle inversion    Ankle eversion     (Blank rows = not tested)  LUMBAR SPECIAL TESTS:  Straight leg raise test: neg, pain end range Passive SLR , Trendelenburg sign: Negative, and pain increased with supine full body extension reduced when knees were bent.  *Long axis distraction of each leg provided full pain relief  FUNCTIONAL TESTS:  5 times sit to stand: NT   SLS increased effort on Rt LE but can do this 20-30 sec  GAIT: Distance walked: 150 Assistive device utilized: None Level of assistance: Complete Independence Comments: ne deviations   TODAY'S TREATMENT:      [facility] Adult PT Treatment:                                                DATE: 05/16/23 Therapeutic Exercise: Elliptical 5 min for warm up Bridge x 10  Knees crossed with LTR  Supine Dead Bug with Leg Extension  3x10  Side plank from knees 2x10 Childs pose x 1 min  Manual therapy: Skilled palpation to identify trigger points for TDN STM to all listed muscles following TDN  Trigger Point Dry-Needling  Treatment instructions: Expect mild to moderate muscle soreness. S/S of pneumothorax if dry needled over a lung field, and to seek immediate medical attention should they occur. Patient verbalized understanding of these instructions and education.  Patient Consent Given: Yes Education handout provided: No Muscles treated: bil lumbar paraspinals (estim), bil UT Electrical stimulation performed: Yes Parameters: 15 min low frequency - milli amps - low intensity Treatment response/outcome: twitch   [facility] Adult PT Treatment:                                                DATE: 04/11/23 Therapeutic Exercise: Supine  A/P tilt PPT with march - 2x10 LTR - 20x Knee to chest each side x 2  Rt knee extension thigh supported Bridge with ball between knees (articulating)  ER/IR in sidelying x  15   Manual Therapy: Sidelying quadratus lumborum (QL) stretch, manual pressure to elongate Rt side Skilled palpation to identify trigger points for TDN STM to all listed muscles following TDN  Trigger Point Dry-Needling  Treatment instructions: Expect mild to moderate muscle soreness. S/S of pneumothorax if dry needled over a lung field, and to seek immediate medical attention should they occur. Patient verbalized understanding of these instructions and education.  Patient Consent Given: Yes Education handout provided: No Muscles treated: R lumbar paraspinals, bil UT Electrical stimulation performed: No Parameters: N/A Treatment response/outcome: twitch, pain reduction    [facility] Adult PT Treatment:  DATE: 04/08/23 Therapeutic Exercise: Supine A/P tilt Knee to chest each side x 2  Rt knee extension thigh supported.  Hamstring and ITB/Adductor with strap Ball under pelvis for lower core stabilization Clam, single leg knee extension 90/90 hold and reverse toe taps  LTR Bridge with ball between knees (articulating)   Hip abduction x 15  ER/IR in sidelying x  15  Manual Therapy: Sidelying quadratus lumborum (QL) stretch, manual pressure to elongate Rt side Trigger point release to Rt QL                                                                                                                    PATIENT EDUCATION:  Education details: PT, HEP, core, directional preference, disc health and pilates  Person educated: Patient Education method: Explanation, Demonstration, Verbal cues, and Handouts Education comprehension: verbalized understanding, returned demonstration, and needs further education  HOME EXERCISE PROGRAM: Access Code: [email or web address] URL:  [email or web address] Date: 05/08/2023 Prepared by: Karie Mainland  Exercises - Prone Press Up On Elbows  - 2-3 x daily - 7 x weekly - 2 sets - 10 reps - 10 hold - Standing 'L' Stretch at Counter  - 2-3 x daily - 7 x weekly - 1 sets - 3-5 reps - 30 hold - Child's Pose Stretch  - 1 x daily - 7 x weekly - 1 sets - 3-5 reps - 30 hold - Supine Posterior Pelvic Tilt  - 2-3 x daily - 7 x weekly - 2 sets - 10 reps - 5 hold - Supine 90/90 Abdominal Bracing  - 1 x daily - 7 x weekly - 2 sets - 10 reps - 5 hold - Supine 90/90 Alternating Heel Touches with Posterior Pelvic Tilt  - 1 x daily - 7 x weekly - 2 sets - 10 reps - 5 hold - Supine 90/90 with Leg Extensions  - 1 x daily - 7 x weekly - 2 sets - 10 reps - 5 hold - Supine Dead Bug with Leg Extension  - 1 x daily - 7 x weekly - 2 sets - 10 reps - 5 hold  ASSESSMENT:  CLINICAL IMPRESSION: Pt with significantly improved baseline pain today compared to last visit.  Pt attributes this to regularly performing HEP and sleeping in her own bed (vs traveling).  She shows improved tolerance to lumbar spine TDN today with reported pain relief following.  She requests recommendation for TENS and NMES unit which was provided.  OBJECTIVE IMPAIRMENTS: decreased mobility, decreased ROM, decreased strength, increased fascial restrictions, increased muscle spasms, impaired flexibility, impaired sensation, postural dysfunction, and pain.   ACTIVITY LIMITATIONS: carrying, lifting, bending, sitting, standing, squatting, and locomotion level  PARTICIPATION LIMITATIONS: meal prep, laundry, community activity, and occupation  PERSONAL FACTORS: Profession and 1 comorbidity: history of upper back pain  are also affecting patient's functional outcome.   REHAB POTENTIAL: Excellent  CLINICAL DECISION MAKING: Evolving/moderate complexity  EVALUATION COMPLEXITY: Moderate   GOALS: Goals reviewed with patient? Yes  SHORT TERM GOALS: Target date:  04/23/2023    Pt will be I with initial HEP  Baseline: Goal status: ongoing   2.  Pt will report improved ability to stand for ADLs, cook up to 30 min  Baseline:  Goal status: ongoing   3.  Pt will understand stability and exercises to complete Pilates Reformer exercises without increasing pain  Baseline:  Goal status: ongoing   4.  Patient will make efforts to stand up each hour for stretch breaks Baseline:  Goal status: ongoing    LONG TERM GOALS: Target date: 05/28/2023    Patient will be independent with home exercise program upon discharge Baseline:  Goal status: INITIAL  2.  Patient patient will no longer have radiating pain to lower extremities Baseline:  Goal status: INITIAL  3.  Patient will be able to stand to cook and do basic home tasks include vacuuming with no more than minimal pain  in lower back for up to 1 hour Baseline:  Goal status: INITIAL  4.  FOTO score will improve to 69% or better to demo improved functional mobility  Baseline:  Goal status: INITIAL  5.  Pt will resume Pilates group classes without pain exacerbation 2 times per week Baseline:  Goal status: INITIAL  PLAN:  PT FREQUENCY: 2x/week  PT DURATION: 8 weeks6-8 weeks   PLANNED INTERVENTIONS: Therapeutic exercises, Therapeutic activity, Neuromuscular re-education, Balance training, Patient/Family education, Self Care, Joint mobilization, Dry Needling, Electrical stimulation, Spinal mobilization, Cryotherapy, Moist heat, Traction, Manual therapy, Re-evaluation, and pilates based PT  .   PLAN FOR NEXT SESSION: HEP, manual (traction, LAD) and consider Tr P DN . Ext vs flexion    Fredderick Phenix, PT 05/16/2023, 1:25 PM   Phone: [phone] Fax: [phone]

## 2023-05-29 ENCOUNTER — Ambulatory Visit: Payer: 59 | Admitting: Physical Therapy

## 2023-06-11 ENCOUNTER — Ambulatory Visit: Payer: 59 | Admitting: Physical Therapy

## 2023-07-12 ENCOUNTER — Telehealth: Payer: Self-pay

## 2023-07-12 DIAGNOSIS — J4540 Moderate persistent asthma, uncomplicated: Secondary | ICD-10-CM

## 2023-07-12 MED ORDER — ALBUTEROL SULFATE HFA 108 (90 BASE) MCG/ACT IN AERS: INHALATION_SPRAY | RESPIRATORY_TRACT | 1 refills | Status: DC

## 2023-07-12 NOTE — Telephone Encounter (Signed)
Rx sent.  Copied from CRM [id number]. Topic: Clinical - Prescription Issue >> Jul 12, 2023  8:00 AM Theodis Sato wrote: Reason for CRM: [place] and forgot her albuterol (VENTOLIN HFA) 108 (90 Base) MCG/ACT inhaler and is requesting Dr. [place] to send her in an emergency prescription to [address] [place], [place] [address] - so that she may pick it up prior to her flight.

## 2023-07-18 ENCOUNTER — Telehealth: Payer: Self-pay

## 2023-07-18 NOTE — Telephone Encounter (Signed)
Received a call from Dr. Levi Aland, patient's son. He is a Risk analyst in [place].  She came in last summer, seeking a lapiplasty for her bunion. Dr. Ardelle Anton discussed surgery with her, but not lapiplasty. She was a little put off by this. She wants to come in for a consult with you, but is hesitant to try to schedule - she does not want to see Dr. Ardelle Anton.  Dr. Levi Aland Arnoldo Hooker) would like to speak to you about everything when you are available Please call cell [phone] Thanks

## 2023-08-26 ENCOUNTER — Ambulatory Visit: Admitting: Family Medicine

## 2023-10-18 ENCOUNTER — Ambulatory Visit: Admitting: Family Medicine

## 2023-10-21 ENCOUNTER — Ambulatory Visit: Admitting: Family Medicine

## 2023-10-21 ENCOUNTER — Encounter: Payer: Self-pay | Admitting: Family Medicine

## 2023-10-21 ENCOUNTER — Ambulatory Visit (INDEPENDENT_AMBULATORY_CARE_PROVIDER_SITE_OTHER): Admitting: Family Medicine

## 2023-10-21 VITALS — BP 120/80 | HR 70 | Temp 97.7°F | Resp 16 | Ht 66.0 in | Wt 149.0 lb

## 2023-10-21 DIAGNOSIS — J4520 Mild intermittent asthma, uncomplicated: Secondary | ICD-10-CM

## 2023-10-21 DIAGNOSIS — R519 Headache, unspecified: Secondary | ICD-10-CM

## 2023-10-21 DIAGNOSIS — R3129 Other microscopic hematuria: Secondary | ICD-10-CM

## 2023-10-21 LAB — CBC
HCT: 42.4 % (ref 36.0–46.0)
Hemoglobin: 14.2 g/dL (ref 12.0–15.0)
MCHC: 33.6 g/dL (ref 30.0–36.0)
MCV: 85.3 fl (ref 78.0–100.0)
Platelets: 267 10*3/uL (ref 150.0–400.0)
RBC: 4.97 Mil/uL (ref 3.87–5.11)
RDW: 12.7 % (ref 11.5–15.5)
WBC: 6.6 10*3/uL (ref 4.0–10.5)

## 2023-10-21 LAB — BASIC METABOLIC PANEL WITH GFR
BUN: 14 mg/dL (ref 6–23)
CO2: 29 meq/L (ref 19–32)
Calcium: 9.8 mg/dL (ref 8.4–10.5)
Chloride: 101 meq/L (ref 96–112)
Creatinine, Ser: 0.81 mg/dL (ref 0.40–1.20)
GFR: 79.56 mL/min (ref >60.00)
Glucose, Bld: 90 mg/dL (ref 70–99)
Potassium: 4.2 meq/L (ref 3.5–5.1)
Sodium: 136 meq/L (ref 135–145)

## 2023-10-21 LAB — URINALYSIS, MICROSCOPIC ONLY
RBC / HPF: NONE SEEN (ref 0–?)
WBC, UA: NONE SEEN (ref 0–?)

## 2023-10-21 LAB — C-REACTIVE PROTEIN: CRP: <1 mg/dL (ref 0.5–20.0)

## 2023-10-21 LAB — SEDIMENTATION RATE: Sed Rate: 10 mm/h (ref 0–30)

## 2023-10-21 MED ORDER — AMITRIPTYLINE HCL 25 MG PO TABS: 25.0000 mg | ORAL_TABLET | Freq: Every day | ORAL | 1 refills | Status: DC

## 2023-10-21 MED ORDER — ALBUTEROL SULFATE HFA 108 (90 BASE) MCG/ACT IN AERS: INHALATION_SPRAY | RESPIRATORY_TRACT | 1 refills | Status: DC

## 2023-10-21 MED ORDER — FLUTICASONE FUROATE-VILANTEROL 100-25 MCG/ACT IN AEPB: 1.0000 | INHALATION_SPRAY | Freq: Every day | RESPIRATORY_TRACT | 2 refills | Status: AC

## 2023-10-21 NOTE — Patient Instructions (Signed)
 A few things to remember from today's visit:  Temporal headache - Plan: MR Brain Wo Contrast, CBC, C-reactive protein, Sedimentation rate, Basic metabolic panel with GFR, amitriptyline (ELAVIL) 25 MG tablet  Asthma, well controlled, moderate persistent - Plan: albuterol  (VENTOLIN  HFA) 108 (90 Base) MCG/ACT inhaler  Asthma, cold induced, moderate persistent, with status asthmaticus - Plan: fluticasone  furoate-vilanterol (BREO ELLIPTA ) 100-25 MCG/ACT AEPB  Amitriptyline 1/2 tab at bedtime, increase to whole tab 2 weeks after. Take Albuterol  inh 15-20 min before exercising outdoors. Headache diary.  If you need refills for medications you take chronically, please call your pharmacy. Do not use My Chart to request refills or for acute issues that need immediate attention. If you send a my chart message, it may take a few days to be addressed, specially if I am not in the office.  Please be sure medication list is accurate. If a new problem present, please set up appointment sooner than planned today.

## 2023-10-21 NOTE — Progress Notes (Signed)
 ACUTE VISIT Chief Complaint  Patient presents with   Headache    On & off, lately has been having one every morning.    HPI: [name] is a 58 y.o. female with a PMHx significant for HLD, anxiety, vitamin D  deficiency, and neck pain, who is here today complaining of headaches as described above.   Patient complains of headaches every morning,wakes up with headache and sometimes it wakes her up. Problem has been going on for about 6 months. She has had intermittent headaches for a long time prior to that. She describes the headaches as a bitemporal pain which she rates as a 10/10 at it's worst. It lasts for about 40 minutes. When it dissipates, her head feels numb for a few hours. She feels rested when she gets up unless she has had the headache.   Sleeps about 9 hours, wakes up q 2-3 hours.  In the past, she has been told she snores, but no sleep apnea. She has been taking tylenol  for the headache, which helps.  Headache lasts about 40 min but she has facial numbness that lasted a couple hours. No associated weakness or visual changes.  Mentions she has had increased stress levels lately, she doe snot elaborate in this regard. She also states that her [place] ring tells her she has elevated stress at night. No associated tachycardia or decreased O2 sats.  Last eye exam was 7 months ago.   Pertinent negatives include dental problems, nasal congestion or rhinorrhea beyond normal allergy  symptoms, visual changes, abnormal weight loss, fever, or neck pain.   Also reports she has a "migraine" every 3-4 months in a similar area. In these instances, she also has floaters and associated nausea and vomiting.   Asthma She uses her Breo 100-25 mcg 1 puff daily and albuterol  inhalers daily and would like refills of both. Symptoms exacerbated by pollen. During this time of the year she uses Albuterol  inh almost daily, symptoms aggravated by walking outdoors:Cough and wheezing.  Review of  Systems  Constitutional:  Negative for activity change, appetite change, chills, fever and unexpected weight change.  HENT:  Negative for dental problem, sore throat and trouble swallowing.   Respiratory:  Negative for shortness of breath.   Cardiovascular:  Negative for chest pain.  Gastrointestinal:  Negative for abdominal pain, nausea and vomiting.  Endocrine: Negative for cold intolerance and heat intolerance.  Allergic/Immunologic: Positive for environmental allergies.  Neurological:  Positive for numbness. Negative for syncope, facial asymmetry and weakness.  Psychiatric/Behavioral:  Positive for sleep disturbance. Negative for confusion. The patient is nervous/anxious.   See other pertinent positives and negatives in HPI.  Current Outpatient Medications on File Prior to Visit  Medication Sig Dispense Refill   cetirizine (ZYRTEC) 10 MG tablet Take by mouth.     clobetasol  ointment (TEMOVATE ) 0.05 % Apply as directed twice daily for up to 5 days.  Do not use more than 5 days per month. 60 g 0   methocarbamol  (ROBAXIN ) 500 MG tablet Take 1 tablet (500 mg total) by mouth every 8 (eight) hours as needed for muscle spasms. 45 tablet 0   tirzepatide (MOUNJARO) 2.5 MG/0.5ML Pen Inject 2.5 mg into the skin once a week.     No current facility-administered medications on file prior to visit.   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   No Known Allergies  Social History   Socioeconomic  History   Marital status: Married    Spouse name: Not on file   Number of children: 2   Years of education: Not on file   Highest education level: Not on file  Occupational History    Employer: [employer]  Tobacco Use   Smoking status: Never   Smokeless tobacco: Never  Vaping Use   Vaping status: Never Used  Substance and Sexual Activity   Alcohol use: Not Currently    Alcohol/week: 0.0 - 1.0 standard drinks of alcohol   Drug use: No    Sexual activity: Not Currently    Partners: Male    Birth control/protection: Post-menopausal  Other Topics Concern   Not on file  Social History Narrative   Not on file   Social Drivers of Health   Financial Resource Strain: Not on file  Food Insecurity: Not on file  Transportation Needs: Not on file  Physical Activity: Not on file  Stress: Not on file  Social Connections: Not on file   Vitals:   10/21/23 1418  BP: 120/80  Pulse: 70  Resp: 16  Temp: 97.7 F (36.5 C)  SpO2: 96%   Body mass index is 24.05 kg/m.  Physical Exam Vitals and nursing note reviewed.  Constitutional:      General: She is not in acute distress.    Appearance: She is well-developed.  HENT:     Head: Normocephalic and atraumatic.     Comments: No bi-temporal tenderness with palpation.    Mouth/Throat:     Mouth: Mucous membranes are moist.     Pharynx: Oropharynx is clear.     Comments: No TMJ crepitus or limited ROM. Eyes:     Conjunctiva/sclera: Conjunctivae normal.     Funduscopic exam:    Right eye: No hemorrhage or papilledema.        Left eye: No hemorrhage or papilledema.  Cardiovascular:     Rate and Rhythm: Normal rate and regular rhythm.     Heart sounds: No murmur heard. Pulmonary:     Effort: Pulmonary effort is normal. No respiratory distress.     Breath sounds: Normal breath sounds.  Musculoskeletal:     Cervical back: Neck supple.  Lymphadenopathy:     Cervical: No cervical adenopathy.  Skin:    General: Skin is warm.     Findings: No erythema or rash.  Neurological:     General: No focal deficit present.     Mental Status: She is alert and oriented to person, place, and time.     Cranial Nerves: No cranial nerve deficit.     Sensory: No sensory deficit.     Motor: No weakness or pronator drift.     Gait: Gait normal.     Deep Tendon Reflexes:     Reflex Scores:      Bicep reflexes are 2+ on the right side and 2+ on the left side.      Patellar reflexes are 2+  on the right side and 2+ on the left side. Psychiatric:        Mood and Affect: Mood and affect normal.    ASSESSMENT AND PLAN:  Kathleen Farley was seen today for headaches.  Lab Results  Component Value Date   WBC 6.6 10/21/2023   HGB 14.2 10/21/2023   HCT 42.4 10/21/2023   MCV 85.3 10/21/2023   PLT 267.0 10/21/2023   Lab Results  Component Value Date   CRP <1.0 10/21/2023   Lab Results  Component Value Date   ESRSEDRATE 10 10/21/2023   Lab Results  Component Value Date   NA 136 10/21/2023   CL 101 10/21/2023   K 4.2 10/21/2023   CO2 29 10/21/2023   BUN 14 10/21/2023   CREATININE 0.81 10/21/2023   GFR 79.56 10/21/2023   CALCIUM 9.8 10/21/2023   ALBUMIN 4.5 01/16/2023   GLUCOSE 90 10/21/2023   Temporal headache We discussed possible etiologies. It is not associated with N/V or photophobia as her typical migraine. ? Tension headache. Because associated facial numbness I think brain MRI is warrant. Instructed about warning signs. She agrees with trying Amitriptyline 25 mg at bedtime, starting 1/2 tab and increasing to 1 tab in 1-2 weeks. Medication may also help with sleep. Side effects discussed. Monitor for new symptoms. Instructed about warning signs. Start a headache diary. F/U in 6 weeks, before if needed.  -     MR BRAIN WO CONTRAST; Future -     CBC; Future -     C-reactive protein; Future -     Sedimentation rate; Future -     Basic metabolic panel with GFR; Future -     Amitriptyline HCl; Take 1 tablet (25 mg total) by mouth at bedtime.  Dispense: 30 tablet; Refill: 1  Asthma, stable, mild intermittent  In general problem is stable, worse during spring. Continue Albuterol  inh 1-2 puff before exercising outdoors. Continue Breo 100-25 mcg 1 puff daily.  -     Albuterol  Sulfate HFA; USE 2 PUFFS EVERY 4 HOURS AS NEEDED FOR WHEEZING  Dispense: 1 each; Refill: 1 -     Fluticasone  Furoate-Vilanterol; Inhale 1 puff into the lungs daily.  Dispense: 180 each;  Refill: 2  Return in about 6 weeks (around 12/02/2023) for HA.  I, Odelia Bender, acting as a scribe for Yumna Ebers [place], MD., have documented all relevant documentation on the behalf of Kathleen Lovern [place], MD, as directed by  Kathleen Berhe [place], MD while in the presence of Kathleen Winthrop [place], MD.   I, Wang Granada [place], MD, have reviewed all documentation for this visit. The documentation on 10/21/23 for the exam, diagnosis, procedures, and orders are all accurate and complete.  Ezekiel Menzer G. [place], MD  [facility]. [place] office.

## 2023-11-19 ENCOUNTER — Other Ambulatory Visit: Payer: Self-pay

## 2023-11-19 DIAGNOSIS — R519 Headache, unspecified: Secondary | ICD-10-CM

## 2023-11-19 MED ORDER — AMITRIPTYLINE HCL 25 MG PO TABS
25.0000 mg | ORAL_TABLET | Freq: Every day | ORAL | 1 refills | Status: AC
Start: 2023-11-19 — End: ?

## 2023-12-20 ENCOUNTER — Encounter (HOSPITAL_BASED_OUTPATIENT_CLINIC_OR_DEPARTMENT_OTHER): Payer: Self-pay | Admitting: Obstetrics & Gynecology

## 2024-02-11 ENCOUNTER — Ambulatory Visit (HOSPITAL_BASED_OUTPATIENT_CLINIC_OR_DEPARTMENT_OTHER): Admitting: Obstetrics & Gynecology

## 2024-02-11 ENCOUNTER — Ambulatory Visit: Admitting: Family Medicine

## 2024-04-07 ENCOUNTER — Ambulatory Visit (HOSPITAL_BASED_OUTPATIENT_CLINIC_OR_DEPARTMENT_OTHER): Admitting: Obstetrics & Gynecology

## 2024-05-25 ENCOUNTER — Ambulatory Visit: Admitting: Family Medicine

## 2024-05-25 ENCOUNTER — Encounter: Payer: Self-pay | Admitting: Family Medicine

## 2024-05-25 VITALS — BP 108/80 | HR 69 | Temp 97.9°F | Resp 16 | Ht 66.0 in | Wt 151.8 lb

## 2024-05-25 DIAGNOSIS — R10A1 Flank pain, right side: Secondary | ICD-10-CM

## 2024-05-25 DIAGNOSIS — M545 Low back pain, unspecified: Secondary | ICD-10-CM

## 2024-05-25 LAB — COMPREHENSIVE METABOLIC PANEL WITH GFR
ALT: 16 U/L (ref 0–35)
AST: 18 U/L (ref 0–37)
Albumin: 4.5 g/dL (ref 3.5–5.2)
Alkaline Phosphatase: 60 U/L (ref 39–117)
BUN: 13 mg/dL (ref 6–23)
CO2: 28 meq/L (ref 19–32)
Calcium: 10.1 mg/dL (ref 8.4–10.5)
Chloride: 103 meq/L (ref 96–112)
Creatinine, Ser: 0.86 mg/dL (ref 0.40–1.20)
GFR: 73.73 mL/min (ref >60.00)
Glucose, Bld: 76 mg/dL (ref 70–99)
Potassium: 5.1 meq/L (ref 3.5–5.1)
Sodium: 139 meq/L (ref 135–145)
Total Bilirubin: 0.6 mg/dL (ref 0.2–1.2)
Total Protein: 6.8 g/dL (ref 6.0–8.3)

## 2024-05-25 LAB — URINALYSIS, ROUTINE W REFLEX MICROSCOPIC
Bilirubin Urine: NEGATIVE
Nitrite: NEGATIVE
Specific Gravity, Urine: <=1.005 — AB (ref 1.000–1.030)
Total Protein, Urine: NEGATIVE
Urine Glucose: NEGATIVE
Urobilinogen, UA: 0.2 (ref 0.0–1.0)
pH: 6 (ref 5.0–8.0)

## 2024-05-25 LAB — CBC
HCT: 41.7 % (ref 36.0–46.0)
Hemoglobin: 14.1 g/dL (ref 12.0–15.0)
MCHC: 33.8 g/dL (ref 30.0–36.0)
MCV: 83.6 fl (ref 78.0–100.0)
Platelets: 278 K/uL (ref 150.0–400.0)
RBC: 4.98 Mil/uL (ref 3.87–5.11)
RDW: 12.9 % (ref 11.5–15.5)
WBC: 5.9 K/uL (ref 4.0–10.5)

## 2024-05-25 MED ORDER — CELECOXIB 100 MG PO CAPS: 100.0000 mg | ORAL_CAPSULE | Freq: Two times a day (BID) | ORAL | 0 refills | Status: AC

## 2024-05-25 MED ORDER — METHOCARBAMOL 500 MG PO TABS: 500.0000 mg | ORAL_TABLET | Freq: Three times a day (TID) | ORAL | 0 refills | Status: AC | PRN

## 2024-05-25 NOTE — Progress Notes (Unsigned)
 ACUTE VISIT Chief Complaint  Patient presents with   Acute Visit    Right side abdominal pain    HPI: [name] is a 58 y.o. female, who is here today complaining of ***  Discussed the use of AI scribe software for clinical note transcription with the patient, who gave verbal consent to proceed.  History of Present Illness Kathleen Farley is a 58 year old female with PMHx significant for hyperlipidemia, anxiety, vitamin D  deficiency, and neck pain here today with her husband complaining of right-sided pain, and unwitnessed.  She has been experiencing right-sided back pain radiating to the abdomen for the past couple of months. The pain is described as a constant pressure that sometimes worsens, particularly in the morning upon waking. There are no specific triggers related to food or movement, and she has not taken any medication for the pain.  She recalls a similar episode in the past while in [place], where she was diagnosed with a gallbladder stone, she did not pursue surgical treatment at that time. She does not drink much water and wonders if this might be related to her symptoms.  No changes in bowel habits, blood in urine or stool, nausea, vomiting, fever, chills, or changes in appetite. She notes that she is 'always cold,' but this is not a new symptom.  She mentions a recent move to a new house a couple of months ago, which involved a lot of physical work, but she does not believe the pain is muscular in origin.  Lab Results  Component Value Date   ALT 16 01/16/2023   AST 20 01/16/2023   ALKPHOS 78 01/16/2023   BILITOT 0.3 01/16/2023   Review of Systems See other pertinent positives and negatives in HPI.  Current Outpatient Medications on File Prior to Visit  Medication Sig Dispense Refill   albuterol  (VENTOLIN  HFA) 108 (90 Base) MCG/ACT inhaler USE 2 PUFFS EVERY 4 HOURS AS NEEDED FOR WHEEZING 1 each 1   cetirizine (ZYRTEC) 10 MG tablet Take by mouth.      clobetasol  ointment (TEMOVATE ) 0.05 % Apply as directed twice daily for up to 5 days.  Do not use more than 5 days per month. 60 g 0   fluticasone  furoate-vilanterol (BREO ELLIPTA ) 100-25 MCG/ACT AEPB Inhale 1 puff into the lungs daily. 180 each 2   tirzepatide (MOUNJARO) 2.5 MG/0.5ML Pen Inject 2.5 mg into the skin once a week.     amitriptyline  (ELAVIL ) 25 MG tablet Take 1 tablet (25 mg total) by mouth at bedtime. (Patient not taking: Reported on 05/25/2024) 90 tablet 1   No current facility-administered medications on file prior to visit.    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   No Known Allergies  Social History   Socioeconomic History   Marital status: Married    Spouse name: Not on file   Number of children: 2   Years of education: Not on file   Highest education level: Not on file  Occupational History    Employer: [employer]  Tobacco Use   Smoking status: Never   Smokeless tobacco: Never  Vaping Use   Vaping status: Never Used  Substance and Sexual Activity   Alcohol use: Not Currently    Alcohol/week: 0.0 - 1.0 standard drinks of alcohol   Drug use: No   Sexual activity: Not Currently    Partners: Male  Birth control/protection: Post-menopausal  Other Topics Concern   Not on file  Social History Narrative   Not on file   Social Drivers of Health   Financial Resource Strain: Not on file  Food Insecurity: Not on file  Transportation Needs: Not on file  Physical Activity: Not on file  Stress: Not on file  Social Connections: Not on file    Vitals:   05/25/24 1355  BP: 108/80  Pulse: 69  Resp: 16  Temp: 97.9 F (36.6 C)  SpO2: 99%   Body mass index is 24.5 kg/m.  Physical Exam Vitals and nursing note reviewed.  Constitutional:      General: She is not in acute distress.    Appearance: She is well-developed.  HENT:     Head: Normocephalic and atraumatic.     Mouth/Throat:      Mouth: Mucous membranes are moist.     Pharynx: Oropharynx is clear.  Eyes:     Conjunctiva/sclera: Conjunctivae normal.  Cardiovascular:     Rate and Rhythm: Normal rate and regular rhythm.     Pulses:          Dorsalis pedis pulses are 2+ on the right side and 2+ on the left side.     Heart sounds: No murmur heard. Pulmonary:     Effort: Pulmonary effort is normal. No respiratory distress.     Breath sounds: Normal breath sounds.  Abdominal:     Palpations: Abdomen is soft. There is no hepatomegaly or mass.     Tenderness: There is abdominal tenderness in the right upper quadrant. There is no right CVA tenderness or left CVA tenderness.   Musculoskeletal:     Lumbar back: Tenderness present.       Back:  Lymphadenopathy:     Cervical: No cervical adenopathy.  Skin:    General: Skin is warm.     Findings: No erythema or rash.  Neurological:     General: No focal deficit present.     Mental Status: She is alert and oriented to person, place, and time.     Cranial Nerves: No cranial nerve deficit.     Gait: Gait normal.  Psychiatric:        Mood and Affect: Mood and affect normal.    ASSESSMENT AND PLAN:   Right-sided low back pain without sciatica, unspecified chronicity -     Celecoxib ; Take 1 capsule (100 mg total) by mouth 2 (two) times daily for 10 days.  Dispense: 20 capsule; Refill: 0 -     Methocarbamol ; Take 1 tablet (500 mg total) by mouth every 8 (eight) hours as needed for up to 15 days for muscle spasms.  Dispense: 45 tablet; Refill: 0  Right flank pain -     Urinalysis, Routine w reflex microscopic; Future -     Comprehensive metabolic panel with GFR; Future -     US  ABDOMEN LIMITED RUQ (LIVER/GB); Future -     CBC; Future  We discussed possible etiologies, it is not the typical presentation for cholelithiasis.?  Musculoskeletal pain. Reports to past history of cholelithiasis, RUQ US  will be arranged. Monitor for new symptoms. Clearly instructed about  warning signs.  In the past she has been seen for acute right-sided lower abdominal pain. She agrees with empiric treatment for musculoskeletal pain with methocarbamol  500 mg 3 times daily as needed, especially at night, and Celebrex  100 mg twice daily for 10 days. ***  Return if symptoms worsen or fail to improve,  for keep next appointment.  Chirag Krueger G. Rossie Bretado, MD  [facility]. [place] office.

## 2024-05-25 NOTE — Patient Instructions (Signed)
 A few things to remember from today's visit:  Right flank pain - Plan: Urinalysis, Routine w reflex microscopic, Comprehensive metabolic panel with GFR, US  ABDOMEN LIMITED RUQ (LIVER/GB)  Right-sided low back pain without sciatica, unspecified chronicity - Plan: celecoxib  (CELEBREX ) 100 MG capsule, methocarbamol  (ROBAXIN ) 500 MG tablet Celebrex  and methocarbamol  for a few days. Monitor for new symptoms.  If you need refills for medications you take chronically, please call your pharmacy. Do not use My Chart to request refills or for acute issues that need immediate attention. If you send a my chart message, it may take a few days to be addressed, specially if I am not in the office.  Please be sure medication list is accurate. If a new problem present, please set up appointment sooner than planned today.

## 2024-05-26 ENCOUNTER — Ambulatory Visit: Payer: Self-pay | Admitting: Family Medicine

## 2024-05-28 ENCOUNTER — Ambulatory Visit (HOSPITAL_BASED_OUTPATIENT_CLINIC_OR_DEPARTMENT_OTHER)

## 2024-06-22 ENCOUNTER — Ambulatory Visit (HOSPITAL_BASED_OUTPATIENT_CLINIC_OR_DEPARTMENT_OTHER): Admitting: Obstetrics & Gynecology

## 2024-06-29 DIAGNOSIS — J4520 Mild intermittent asthma, uncomplicated: Secondary | ICD-10-CM

## 2024-10-06 ENCOUNTER — Ambulatory Visit (HOSPITAL_BASED_OUTPATIENT_CLINIC_OR_DEPARTMENT_OTHER): Admitting: Obstetrics & Gynecology

## 2024-10-08 ENCOUNTER — Ambulatory Visit (HOSPITAL_BASED_OUTPATIENT_CLINIC_OR_DEPARTMENT_OTHER): Admitting: Obstetrics & Gynecology
